# Patient Record
Sex: Female | Born: 1935 | Race: White | Hispanic: No | Smoking: Never smoker
Health system: Southern US, Community
[De-identification: ages and names within clinical notes are randomized; demographics above are authoritative.]

## PROBLEM LIST (undated history)

## (undated) DIAGNOSIS — Z79899 Other long term (current) drug therapy: Secondary | ICD-10-CM

## (undated) DIAGNOSIS — E870 Hyperosmolality and hypernatremia: Secondary | ICD-10-CM

## (undated) DIAGNOSIS — F0150 Vascular dementia without behavioral disturbance: Secondary | ICD-10-CM

## (undated) DIAGNOSIS — I10 Essential (primary) hypertension: Secondary | ICD-10-CM

## (undated) DIAGNOSIS — Y840 Cardiac catheterization as the cause of abnormal reaction of the patient, or of later complication, without mention of misadventure at the time of the procedure: Secondary | ICD-10-CM

## (undated) DIAGNOSIS — E119 Type 2 diabetes mellitus without complications: Secondary | ICD-10-CM

## (undated) DIAGNOSIS — E7800 Pure hypercholesterolemia, unspecified: Secondary | ICD-10-CM

## (undated) DIAGNOSIS — L2089 Other atopic dermatitis: Secondary | ICD-10-CM

## (undated) DIAGNOSIS — R1310 Dysphagia, unspecified: Secondary | ICD-10-CM

## (undated) HISTORY — DX: Other atopic dermatitis: L20.89

## (undated) HISTORY — DX: Hyperosmolality and hypernatremia: E87.0

## (undated) HISTORY — DX: Dysphagia, unspecified: R13.10

## (undated) HISTORY — DX: Other long term (current) drug therapy: Z79.899

## (undated) HISTORY — DX: Vascular dementia without behavioral disturbance: F01.50

## (undated) HISTORY — DX: Cardiac catheterization as the cause of abnormal reaction of the patient, or of later complication, without mention of misadventure at the time of the procedure: Y84.0

## (undated) HISTORY — PX: FRACTURE SURGERY: SHX138

## (undated) HISTORY — DX: Type 2 diabetes mellitus without complications: E11.9

---

## 2002-04-30 LAB — HM PAP SMEAR

## 2004-10-03 ENCOUNTER — Inpatient Hospital Stay (HOSPITAL_COMMUNITY): Admission: EM | Admit: 2004-10-03 | Discharge: 2004-10-05 | Payer: Self-pay | Admitting: Emergency Medicine

## 2004-10-03 ENCOUNTER — Ambulatory Visit: Payer: Self-pay | Admitting: *Deleted

## 2004-10-12 ENCOUNTER — Ambulatory Visit: Payer: Self-pay | Admitting: Cardiology

## 2004-12-13 ENCOUNTER — Ambulatory Visit: Payer: Self-pay

## 2005-01-09 ENCOUNTER — Ambulatory Visit: Payer: Self-pay | Admitting: Cardiology

## 2005-07-19 ENCOUNTER — Ambulatory Visit: Payer: Self-pay | Admitting: Cardiology

## 2006-02-05 ENCOUNTER — Ambulatory Visit: Payer: Self-pay | Admitting: Cardiology

## 2006-08-21 ENCOUNTER — Ambulatory Visit: Payer: Self-pay | Admitting: Cardiology

## 2006-10-05 DIAGNOSIS — Y840 Cardiac catheterization as the cause of abnormal reaction of the patient, or of later complication, without mention of misadventure at the time of the procedure: Secondary | ICD-10-CM

## 2006-10-05 HISTORY — DX: Cardiac catheterization as the cause of abnormal reaction of the patient, or of later complication, without mention of misadventure at the time of the procedure: Y84.0

## 2007-04-07 ENCOUNTER — Ambulatory Visit: Payer: Self-pay | Admitting: Cardiology

## 2007-08-18 ENCOUNTER — Ambulatory Visit: Payer: Self-pay | Admitting: Cardiology

## 2007-08-31 LAB — HM DEXA SCAN

## 2008-08-16 ENCOUNTER — Encounter: Admission: RE | Admit: 2008-08-16 | Discharge: 2008-08-16 | Payer: Self-pay | Admitting: Gastroenterology

## 2010-03-03 ENCOUNTER — Inpatient Hospital Stay (HOSPITAL_COMMUNITY): Admission: EM | Admit: 2010-03-03 | Discharge: 2010-03-13 | Payer: Self-pay | Admitting: Emergency Medicine

## 2010-03-08 ENCOUNTER — Ambulatory Visit: Payer: Self-pay | Admitting: Physical Medicine & Rehabilitation

## 2010-03-10 HISTORY — PX: FEMUR SURGERY: SHX943

## 2010-03-10 HISTORY — PX: SHOULDER SURGERY: SHX246

## 2010-03-13 ENCOUNTER — Inpatient Hospital Stay (HOSPITAL_COMMUNITY)
Admission: RE | Admit: 2010-03-13 | Discharge: 2010-03-21 | Payer: Self-pay | Admitting: Physical Medicine & Rehabilitation

## 2010-08-22 LAB — DIFFERENTIAL
Basophils Absolute: 0 10*3/uL (ref 0.0–0.1)
Lymphocytes Relative: 30 % (ref 12–46)
Lymphs Abs: 2.5 10*3/uL (ref 0.7–4.0)
Neutro Abs: 4.6 10*3/uL (ref 1.7–7.7)
Neutrophils Relative %: 54 % (ref 43–77)

## 2010-08-22 LAB — PROTIME-INR
INR: 1.98 — ABNORMAL HIGH (ref 0.00–1.49)
INR: 2.45 — ABNORMAL HIGH (ref 0.00–1.49)
Prothrombin Time: 18.5 seconds — ABNORMAL HIGH (ref 11.6–15.2)
Prothrombin Time: 19.7 seconds — ABNORMAL HIGH (ref 11.6–15.2)
Prothrombin Time: 22.7 seconds — ABNORMAL HIGH (ref 11.6–15.2)
Prothrombin Time: 23.3 seconds — ABNORMAL HIGH (ref 11.6–15.2)
Prothrombin Time: 26.4 seconds — ABNORMAL HIGH (ref 11.6–15.2)

## 2010-08-22 LAB — CBC
HCT: 35.2 % — ABNORMAL LOW (ref 36.0–46.0)
Hemoglobin: 11.4 g/dL — ABNORMAL LOW (ref 12.0–15.0)
MCH: 30.2 pg (ref 26.0–34.0)
MCH: 30.2 pg (ref 26.0–34.0)
MCH: 30.4 pg (ref 26.0–34.0)
MCHC: 32.4 g/dL (ref 30.0–36.0)
MCHC: 32.7 g/dL (ref 30.0–36.0)
MCHC: 32.7 g/dL (ref 30.0–36.0)
MCV: 93.1 fL (ref 78.0–100.0)
Platelets: 214 10*3/uL (ref 150–400)
Platelets: 266 10*3/uL (ref 150–400)
RBC: 3.55 MIL/uL — ABNORMAL LOW (ref 3.87–5.11)
RBC: 3.64 MIL/uL — ABNORMAL LOW (ref 3.87–5.11)
RDW: 14 % (ref 11.5–15.5)
RDW: 14.1 % (ref 11.5–15.5)

## 2010-08-22 LAB — COMPREHENSIVE METABOLIC PANEL
BUN: 14 mg/dL (ref 6–23)
CO2: 26 mEq/L (ref 19–32)
Calcium: 8.6 mg/dL (ref 8.4–10.5)
Creatinine, Ser: 0.72 mg/dL (ref 0.4–1.2)
GFR calc Af Amer: >60 mL/min (ref >60)
GFR calc non Af Amer: >60 mL/min (ref >60)
Glucose, Bld: 107 mg/dL — ABNORMAL HIGH (ref 70–99)
Total Bilirubin: 1.2 mg/dL (ref 0.3–1.2)

## 2010-08-22 LAB — BASIC METABOLIC PANEL
CO2: 29 mEq/L (ref 19–32)
Calcium: 8.1 mg/dL — ABNORMAL LOW (ref 8.4–10.5)
Creatinine, Ser: 0.65 mg/dL (ref 0.4–1.2)
GFR calc Af Amer: >60 mL/min (ref >60)

## 2010-08-23 LAB — BASIC METABOLIC PANEL
CO2: 24 mEq/L (ref 19–32)
CO2: 25 mEq/L (ref 19–32)
CO2: 28 mEq/L (ref 19–32)
Calcium: 7.5 mg/dL — ABNORMAL LOW (ref 8.4–10.5)
Calcium: 7.8 mg/dL — ABNORMAL LOW (ref 8.4–10.5)
Calcium: 7.9 mg/dL — ABNORMAL LOW (ref 8.4–10.5)
Calcium: 8 mg/dL — ABNORMAL LOW (ref 8.4–10.5)
Calcium: 8.1 mg/dL — ABNORMAL LOW (ref 8.4–10.5)
Calcium: 9.1 mg/dL (ref 8.4–10.5)
Chloride: 104 mEq/L (ref 96–112)
Chloride: 107 mEq/L (ref 96–112)
Chloride: 107 mEq/L (ref 96–112)
Chloride: 110 mEq/L (ref 96–112)
Creatinine, Ser: 0.72 mg/dL (ref 0.4–1.2)
GFR calc Af Amer: >60 mL/min (ref >60)
GFR calc Af Amer: >60 mL/min (ref >60)
GFR calc Af Amer: >60 mL/min (ref >60)
GFR calc Af Amer: >60 mL/min (ref >60)
GFR calc Af Amer: >60 mL/min (ref >60)
GFR calc non Af Amer: >60 mL/min (ref >60)
GFR calc non Af Amer: >60 mL/min (ref >60)
GFR calc non Af Amer: >60 mL/min (ref >60)
GFR calc non Af Amer: >60 mL/min (ref >60)
Glucose, Bld: 118 mg/dL — ABNORMAL HIGH (ref 70–99)
Glucose, Bld: 127 mg/dL — ABNORMAL HIGH (ref 70–99)
Glucose, Bld: 137 mg/dL — ABNORMAL HIGH (ref 70–99)
Glucose, Bld: 170 mg/dL — ABNORMAL HIGH (ref 70–99)
Potassium: 4.1 mEq/L (ref 3.5–5.1)
Potassium: 4.3 mEq/L (ref 3.5–5.1)
Potassium: 4.3 mEq/L (ref 3.5–5.1)
Sodium: 137 mEq/L (ref 135–145)
Sodium: 138 mEq/L (ref 135–145)
Sodium: 138 mEq/L (ref 135–145)
Sodium: 138 mEq/L (ref 135–145)
Sodium: 139 mEq/L (ref 135–145)
Sodium: 140 mEq/L (ref 135–145)
Sodium: 141 mEq/L (ref 135–145)

## 2010-08-23 LAB — PROTIME-INR
INR: 1.11 (ref 0.00–1.49)
INR: 1.21 (ref 0.00–1.49)
INR: 1.4 (ref 0.00–1.49)
Prothrombin Time: 14.5 seconds (ref 11.6–15.2)
Prothrombin Time: 17.1 seconds — ABNORMAL HIGH (ref 11.6–15.2)
Prothrombin Time: 17.4 seconds — ABNORMAL HIGH (ref 11.6–15.2)

## 2010-08-23 LAB — CBC
HCT: 26.2 % — ABNORMAL LOW (ref 36.0–46.0)
HCT: 28.6 % — ABNORMAL LOW (ref 36.0–46.0)
HCT: 35.8 % — ABNORMAL LOW (ref 36.0–46.0)
Hemoglobin: 10 g/dL — ABNORMAL LOW (ref 12.0–15.0)
Hemoglobin: 10.6 g/dL — ABNORMAL LOW (ref 12.0–15.0)
Hemoglobin: 11 g/dL — ABNORMAL LOW (ref 12.0–15.0)
Hemoglobin: 11.9 g/dL — ABNORMAL LOW (ref 12.0–15.0)
Hemoglobin: 8.7 g/dL — ABNORMAL LOW (ref 12.0–15.0)
Hemoglobin: 8.8 g/dL — ABNORMAL LOW (ref 12.0–15.0)
Hemoglobin: 9.4 g/dL — ABNORMAL LOW (ref 12.0–15.0)
MCH: 30 pg (ref 26.0–34.0)
MCH: 30.3 pg (ref 26.0–34.0)
MCH: 30.4 pg (ref 26.0–34.0)
MCH: 30.9 pg (ref 26.0–34.0)
MCHC: 32.9 g/dL (ref 30.0–36.0)
MCHC: 33.2 g/dL (ref 30.0–36.0)
MCHC: 33.2 g/dL (ref 30.0–36.0)
MCHC: 33.3 g/dL (ref 30.0–36.0)
MCV: 91.4 fL (ref 78.0–100.0)
MCV: 92.3 fL (ref 78.0–100.0)
MCV: 92.3 fL (ref 78.0–100.0)
Platelets: 127 10*3/uL — ABNORMAL LOW (ref 150–400)
Platelets: 171 10*3/uL (ref 150–400)
RBC: 2.84 MIL/uL — ABNORMAL LOW (ref 3.87–5.11)
RBC: 2.85 MIL/uL — ABNORMAL LOW (ref 3.87–5.11)
RBC: 3.1 MIL/uL — ABNORMAL LOW (ref 3.87–5.11)
RBC: 3.28 MIL/uL — ABNORMAL LOW (ref 3.87–5.11)
RBC: 3.67 MIL/uL — ABNORMAL LOW (ref 3.87–5.11)
RBC: 3.85 MIL/uL — ABNORMAL LOW (ref 3.87–5.11)
RDW: 14.2 % (ref 11.5–15.5)
WBC: 11.3 10*3/uL — ABNORMAL HIGH (ref 4.0–10.5)
WBC: 12.3 10*3/uL — ABNORMAL HIGH (ref 4.0–10.5)
WBC: 9.2 10*3/uL (ref 4.0–10.5)
WBC: 9.4 10*3/uL (ref 4.0–10.5)

## 2010-08-23 LAB — DIFFERENTIAL
Basophils Relative: 0 % (ref 0–1)
Lymphs Abs: 1.6 10*3/uL (ref 0.7–4.0)
Monocytes Absolute: 1 10*3/uL (ref 0.1–1.0)
Monocytes Relative: 6 % (ref 3–12)
Neutro Abs: 13.4 10*3/uL — ABNORMAL HIGH (ref 1.7–7.7)
Neutrophils Relative %: 83 % — ABNORMAL HIGH (ref 43–77)

## 2010-08-23 LAB — POCT I-STAT 4, (NA,K, GLUC, HGB,HCT)
HCT: 26 % — ABNORMAL LOW (ref 36.0–46.0)
Hemoglobin: 8.8 g/dL — ABNORMAL LOW (ref 12.0–15.0)

## 2010-08-23 LAB — URINALYSIS, ROUTINE W REFLEX MICROSCOPIC
Bilirubin Urine: NEGATIVE
Glucose, UA: NEGATIVE mg/dL
Hgb urine dipstick: NEGATIVE
Specific Gravity, Urine: 1.014 (ref 1.005–1.030)
Urobilinogen, UA: 0.2 mg/dL (ref 0.0–1.0)
pH: 6.5 (ref 5.0–8.0)

## 2010-08-23 LAB — CROSSMATCH: ABO/RH(D): A POS

## 2010-08-23 LAB — ABO/RH: ABO/RH(D): A POS

## 2010-08-23 LAB — HEMOGLOBIN AND HEMATOCRIT, BLOOD
HCT: 34.1 % — ABNORMAL LOW (ref 36.0–46.0)
Hemoglobin: 11.5 g/dL — ABNORMAL LOW (ref 12.0–15.0)

## 2010-10-23 NOTE — Assessment & Plan Note (Signed)
[facility]                            CARDIOLOGY OFFICE NOTE   NAME:Beverly Barnett, Beverly Barnett                         MRN:          [id number]  DATE:08/18/2007                            DOB:          April 16, 1924    Primary care is through [facility].   REASON FOR VISIT:  Hypertension.   HISTORY OF PRESENT ILLNESS:  Beverly Barnett is a pleasant woman that I saw  back in October.  She called the office describing recently-documented  elevated blood pressure at home and also some recent symptoms of double  vision.  She states that she saw her ophthalmologist and was told that  this was more than likely related to some eye muscle fatigue and that it  would go away when she would close one eye.  She had no headaches  otherwise and has been symptomatically stable without chest pain or  breathlessness.  I have spoken with Beverly Barnett in the past about adding  additional therapy for her high blood pressure, which has already been  documented.  Her electrocardiogram today is normal showing sinus rhythm  at 73 beats per minute.  We talked about the risk of long-term elevated  blood pressure as it relates to stroke and heart disease, and she is  agreement with trying an additional medication.   ALLERGIES:  No known drug allergies.   PRESENT MEDICATIONS:  Aspirin 325 mg p.o. daily, multivitamin, CoQ-10,  metoprolol 50 mg p.o. b.i.d., strontium.   REVIEW OF SYSTEMS:  As described in the history of Status present  illness.  Otherwise negative.   EXAMINATION:  Blood pressure is 156/86, rate of 75, weight 139 pounds.  The patient is comfortable, in no acute distress.  HEENT:  Conjunctivae and lids normal.  Pharynx is clear.  NECK:  Supple.  No elevated jugular venous pressure, no loud bruits, no  thyromegaly is noted.  LUNGS:  Clear without labored breathing at rest.  CARDIAC:  A regular rate and rhythm.  No loud murmur or gallop.  ABDOMEN:  Soft, nontender.   Normoactive bowel sounds.  EXTREMITIES:  No significant pitting edema.  Distal pulses are 2+.  SKIN:  Warm and dry.  MUSCULOSKELETAL:  No kyphosis noted.  NEUROPSYCHIATRIC:  The patient alert and oriented x3.  Affect is  appropriate.   IMPRESSION AND RECOMMENDATIONS:  1. Hypertension, not optimally controlled.  I will plan to add Norvasc      5 mg daily to her present regimen.  2. Previously-documented nonobstructive coronary disease with      takotsubo cardiomyopathy.      The patient has had subsequent normalization of left ventricular      function and is doing well symptomatically.     Jonelle Sidle, MD  Electronically Signed    SGM/MedQ  DD: 08/18/2007  DT: 08/19/2007  Job #: [id number]

## 2010-10-23 NOTE — Assessment & Plan Note (Signed)
[facility]                            CARDIOLOGY OFFICE NOTE   NAME:Barnett, Beverly HEARNS                         MRN:          [id number]  DATE:04/07/2007                            DOB:          [birth year]    PRIMARY CARE PHYSICIAN:  [facility].   REASON FOR VISIT:  Cardiac followup.   HISTORY OF PRESENT ILLNESS:  I saw Beverly Barnett back in March.  She reports  doing well with no exertional chest pain or dyspnea.  Her  electrocardiogram today is normal, showing sinus rhythm at 60 beats per  minute.  She remains on the medications outlined below.  I once again  note that her blood pressure is elevated today.  She states that it was  150 at home by her blood pressure monitor before she came here but  that typically it is in the 120-130s.  I talked with her today about  hypertension.  She does have a diagnosis of white coat hypertension,  although I suspect her blood pressure is elevated at baseline.  I spoke  with her today about perhaps advancing her regimen with a trial of  either Norvasc or simply hydrochlorothiazide.  She seemed very  apprehensive about taking any additional medications, and I asked her to  discuss this further with her primary care Beverly Barnett as well.  I have  also asked that she bring her home blood pressure monitor in to her next  visit so that we can verify that it is accurate.   ALLERGIES:  No known drug allergies.   CURRENT MEDICATIONS:  1. Aspirin 325 mg p.o. daily.  2. Multivitamin daily.  3. CoQ 10.  4. Metoprolol 50 mg p.o. b.i.d.  5. Strontium nightly.   REVIEW OF SYSTEMS:  As described in the history of present illness,  otherwise negative.   PHYSICAL EXAMINATION:  Blood pressure checked by me, 182/88.  Heart rate  61.  Weight 140 pounds, which is stable.  Patient is comfortable, well-nourished, in no acute distress.  NECK:  No jugular venous distention or loud bruits.  No thyromegaly is  noted.  LUNGS:   Clear without labored breathing at rest.  CARDIAC:  Regular rate and rhythm without loud murmur or gallop.  ABDOMEN:  Soft and nontender.  EXTREMITIES:  No pitting edema.  Distal pulses are 2+.   IMPRESSION/RECOMMENDATIONS:  History of nonobstructive coronary artery  disease and Takotsubo cardiomyopathy.  Patient has recovered from this  quite well and is asymptomatic on medical therapy.  My main  recommendation at this point is better blood pressure control, and I did  relay my concerns to the patient, although she is very hesitant to add  any additional medications.  I asked her to plan to follow up with her  primary care Beverly Barnett and bring in her  home blood pressure monitor.  Otherwise, I will plan to see her back  over the next six months from a cardiac perspective.     Jonelle Sidle, MD  Electronically Signed    SGM/MedQ  DD: 04/07/2007  DT: 04/07/2007  Job #: [id number]

## 2010-10-26 NOTE — Assessment & Plan Note (Signed)
[facility]                              CARDIOLOGY OFFICE NOTE   NAME:Beverly Barnett, Beverly Barnett                         MRN:          [id number]  DATE:02/05/2006                            DOB:          May 14, 1924    Primary care is by [facility]   REASON FOR VISIT:  Routine followup.   HISTORY OF PRESENT ILLNESS:  I saw Beverly Barnett back in February.  She is doing  well, exercising three times a week without angina or limiting dyspnea and  exertion.  I note that she is now on low-dose Statin therapy, and she tells  me that she has had improvement in both her LDL and HDL with this.  Her  electrocardiogram today shows sinus rhythm at 60 beats per minute, with no  significant changes compared to prior tracing.  Her blood pressure is  elevated today, although tends to be when she comes in for visits.  She  tells me that when she checks it at home, her systolics are usually in the  120-130 range, and I suspect she has most likely some element of white coat  hypertension.  She is not having any heart failure symptoms, orthopnea, PND,  palpitations or syncope.   ALLERGIES:  No known drug allergies.   PRESENT MEDICATIONS:  1. Aspirin 325 mg p.o. q.d.  2. Multivitamin 1 p.o. q.d.  3. __________  [place].  4. Actonel.  5. Lipitor 10 mg daily.  6. Metoprolol 50 mg p.o. b.i.d.   REVIEW OF SYSTEMS:  As described in history of present illness.   EXAMINATION:  VITAL SIGNS:  Blood pressure 176/90, rechecked 160/80, heart  rate 60, weight 137 pounds.  GENERAL:  Patient is comfortable, in no acute distress.  NECK:  Reveals no elevated jugular venous pressure without bruits.  LUNGS:  Clear without labored breathing.  CARDIAC:  Regular rate and rhythm without S3 gallop.  EXTREMITIES:  Show no pitting edema.   IMPRESSION:  1. History of non-obstructive coronary atherosclerosis.  Will continue his      strategy of risk factor modification.  Agree with the  addition of low-      dose Statin therapy and would otherwise continue aspirin and beta      blocker.  Her electrocardiogram is stable.  2. Additional history if possible, Takotsubo cardiomyopathy versus      coronary vasospasm in April of 2006.  She had subsequent normalization      of left ventricular function and is not manifesting any particular      symptoms at this time.  Would continue basic observation.  3. Mild hyperlipidemia.  Agree with low-dose Statin therapy with a goal      LDL cholesterol in the 70-80 range optimally.  She will otherwise      continue regular diet and exercise.                                Jonelle Sidle, MD    SGM/MedQ  DD:  02/05/2006  DT:  02/06/2006  Job #:  [id number]   cc:   [facility]

## 2010-10-26 NOTE — Cardiovascular Report (Signed)
Beverly Barnett, Beverly Barnett                  ACCOUNT NO.:  [id number]   MEDICAL RECORD NO.:  [id number]          PATIENT TYPE:  INP   LOCATION:  2906                         FACILITY:  [facility]   PHYSICIAN:  Carole Binning, M.D. LHCDATE OF BIRTH:  [birth year]   DATE OF PROCEDURE:  10/04/2004  DATE OF DISCHARGE:                              CARDIAC CATHETERIZATION   PROCEDURE:  Left heart catheterization with coronary angiography and left  ventriculography.   INDICATIONS FOR PROCEDURE:  The patient is a 76 year old woman who was  admitted to the hospital yesterday with chest pain. Electrocardiogram showed  deep anterolateral and inferior T wave inversion. The cardiac markers were  elevated, consistent with a non-ST segment elevation myocardial infarction.   PROCEDURE:  A 6 French sheath was placed in the right femoral artery.  Coronary angiography was performed with standard Judkins 6 French catheters.  Left ventriculography was performed with an angled pigtail catheter.  Contrast was Omnipaque. At the conclusion of the procedure a Star-close  vascular occlusion device was placed in the right femoral artery with good  hemostasis. There were no complications.   HEMODYNAMIC RESULTS:  Left ventricular pressure 138/23.  Aortic pressure 144/72.  There is no aortic valve gradient.   LEFT VENTRICULOGRAM:  There is moderate akinesis of the apical wall.  Otherwise wall motion is normal. Ejection fraction is estimated at 55%  (calculated at 63%). There is Koeller mitral regurgitation.   CORONARY ARTERIOGRAPHY (LEFT DOMINANT):  The left main is normal.  Left anterior descending artery has a long area of muscle bridging in the  mid vessel with systolic  compression of the vessel. There is diffuse 20%  stenosis throughout this area with diastole. The left anterior descending is  a long vessel, which curls the apex. The left anterior descending gives rise  to a single normal size diagonal branch.  The  left circumflex has a 20% stenosis in the proximal vessel. It is a large  co-dominant vessels, giving rise to a normal size first obtuse marginal  branch, a large second obtuse marginal branch, small first and second  posterolateral branches, and a normal size third posterolateral branch.  The right coronary artery is a small co-dominant vessel, ending as a small  posterior descending artery. There is a 20% stenosis in the proximal vessel  and  20% in the mid vessel.   IMPRESSION:  1. Left ventricular systolic function characterized by moderate apical      akinesis with a low normal ejection fraction.  2. Systolic compression of the mid left anterior descending artery,      consistent with muscle bridging. However, no obstructive coronary      artery disease is identified.  3. The etiology of the patient's enzyme elevation and wall motion      abnormality is not clear. This may represent a case of Takatsubo      cardiomyopathy versus possible coronary spasm of the mid left anterior      descending at the side of muscle bridging. However, there is no fixed      obstructive coronary artery  disease identified.     PLAN:  Medical therapy.      MWP/MEDQ  D:  10/04/2004  T:  10/04/2004  Job:  [id number]   cc:   Maxwell Caul, M.D.  [address]  [place]  [place] [address]  Fax: [phone]   Jonelle Sidle, M.D. [facility]

## 2010-10-26 NOTE — Assessment & Plan Note (Signed)
[facility]                            CARDIOLOGY OFFICE NOTE   NAME:Spruce, Beverly Barnett                         MRN:          [id number]  DATE:08/21/2006                            DOB:          November 12, 1923    REASON FOR VISIT:  Cardiac follow up.   HISTORY OF PRESENT ILLNESS:  Beverly Barnett returns for a routine visit. She  is doing well with a prior history of non-obstructive coronary  atherosclerosis and takotsubo cardiomyopathy. Her electrocardiogram  today is normal showing sinus rhythm at 70 beats-per-minute. She has  done some traveling recently and is trying to walk on a regular basis,  at least 3 times a week. She is not having any exertional chest pain or  shortness of breath. Home blood pressure checks continue to be within  the normal range per her report. She has had fairly consistent white  coat hypertension.   ALLERGIES:  No known drug allergies.   CURRENT MEDICATIONS:  1. Aspirin 325 mg daily.  2. Multivitamin once daily.  3. CoQ10.  4. Os-Cal.  5. Lipitor 10 mg daily.  6. Metoprolol 50 mg b.i.d.  7. Strontium Support 2 tablets nightly.   REVIEW OF SYSTEMS:  As described in the history of present illness.   PHYSICAL EXAMINATION:  VITAL SIGNS: Blood pressure 182/86, heart rate  70, weight 138 pounds.  GENERAL: The patient is comfortable and in no acute distress.  NECK: Reveals no elevated jugular venous pressure or loud bruits; no  thyromegaly is noted.  LUNGS: Clear without labored breathing.  CARDIAC: Regular rate and rhythm without loud murmur or gallop.  ABDOMEN: Soft and nontender.  EXTREMITIES: Exhibit no significant pitting edema.   IMPRESSION/RECOMMENDATIONS:  History of non-obstructive coronary  atherosclerosis and takotsubo cardiomyopathy. The patient is doing quite  well. She has had subsequently documented normalization of left  ventricular function and has is not having any active symptomatology. I  reviewed her  medications and will not make any specific changes today.  With her white coat hypertension, I wonder if a change from Metoprolol  to a calcium channel blocker might be reasonable, particularly with a  possible element of coronary vasospasm in the past. Perhaps this might  provide better general blood pressure control. She is due to follow up  with her primary care Joal Eakle in the next few weeks. Otherwise, we will  plan to see her back in the next six months.     Jonelle Sidle, MD  Electronically Signed    SGM/MedQ  DD: 08/21/2006  DT: 08/23/2006  Job #: [id number]

## 2010-10-26 NOTE — H&P (Signed)
NAME:  Beverly Barnett, Beverly Barnett NO.:  [id number]   MEDICAL RECORD NO.:  [id number]          PATIENT TYPE:  EMS   LOCATION:  MAJO                         FACILITY:  [facility]   PHYSICIAN:  Jonelle Sidle, M.D. LHCDATE OF BIRTH:  Jan 31, 1924   DATE OF ADMISSION:  10/03/2004  DATE OF DISCHARGE:                                HISTORY & PHYSICAL   PRIMARY CARE PHYSICIAN:  Maxwell Caul, M.D.   CHIEF COMPLAINT:  Recent chest pain and left neck discomfort.   HISTORY OF PRESENT ILLNESS:  Beverly Barnett is a very pleasant 77 year old woman  with no reported major medical history and on no prescription medications  who was in her usual state of health until Monday.  She works at an office  doing clerical work and while at work noticed the sudden onset of a fairly  intense chest discomfort described as a explosion in her chest.  It lasted  for several minutes.  This was associated with diaphoresis and a feeling of  unease.  She lay down for approximately 20 minutes and felt better, although  still not back to baseline.  She went about her day and ultimately went  home, still feeling fatigued.  She went to bed that evening and woke up the  next day feeling better.  In the interim, she had some recurrence of left  lower dull aching chest pain as well as left neck discomfort, although not  nearly as severe.  She arranged an office visit with Dr. Jannetta Quint nurse  practitioner and actually saw them earlier today.  It was noted that she had  a abnormal electrocardiogram showing deep T wave inversions in the inferior  and anterolateral leads associated with ST changes predominantly in the  anterolateral leads.  She was subsequently reported to the emergency  department after being treated with aspirin and we have been asked to  evaluate her further.  At present, she is denying any active symptoms.  Speaking with her in more detail, she denies any typical exertional chest  pain or shortness of  breath.  She has used a stationary bicycle without  symptoms.  She reports no known history of cardiovascular disease.   ALLERGIES:  No known drug allergies.   MEDICATIONS:  At home include:  Multivitamin one p.o. every day, Citrucel  fiber as directed, Co-Enzyme Q12 and Chlorella every day.   PAST MEDICAL HISTORY:  1.  No documented history of hypertension, dyslipidemia, type 2 diabetes      mellitus, or cardiovascular disease.  2.  No reported major surgeries.   SOCIAL HISTORY:  The patient is a widow.  She lives in [place].  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  She states that  she remains active.  She denies any significant tobacco use history.  She  drinks occasional alcohol.  She intermittently uses a stationary bicycle.   FAMILY HISTORY:  Noncontributory at present.   REVIEW OF SYSTEMS:  As described in the history of present illness,  otherwise negative.   PHYSICAL EXAMINATION:  VITAL SIGNS:  Blood pressure 156/86, respiratory rate  16, heart rate 90 and regular, temperature 98.3 degrees.  GENERAL:  This is a well nourished, well developed, elderly woman lying in  bed in no acute distress.  HEENT:  Conjunctivae normal.  Oropharynx is clear.  NECK:  Supple without elevated jugular venous pressure, without carotid  bruits.  No thyromegaly or thyroid tenderness is noted.  LUNGS:  Clear to auscultation with non-labored breathing at rest.  CARDIAC:  Reveals a regular rate and rhythm with an S4 but no loud murmur or  pericardial rub.  ABDOMEN:  Soft with normoactive bowel sounds.  EXTREMITIES:  Show no pitting edema.  Peripheral pulses are 2+.  SKIN:  No ulcer changes are noted.  MUSCULOSKELETAL:  No kyphosis is noted.  NEUROPSYCHIATRIC:  The patient is alert and oriented x 3.   Chest x-ray is reported as showing no acute findings.   LABORATORY DATA:  WBC 7.2, hemoglobin 13.3, hematocrit 38.6, platelets 216.  Sodium 139, potassium 4.1, BUN  20, creatinine 1.0, glucose 118.  Initial  troponin I 0.40.   IMPRESSION:  1.  Recent chest discomfort with subsequent left shoulder discomfort in a 77-      year-old woman with no reported major prior medical history.      Electrocardiogram is abnormal as outlined above and her initial cardiac      markers show a minor troponin I elevation of 0.40.  This is concerning      for an acute coronary syndrome, potentially with onset Monday and      stuttering symptoms since that time.  The patient is presently pain free      and hemodynamically stable.  2.  Uncertain lipid status.  At the present, the patient has elevated blood      pressure but no standing history of chronic hypertension.  She is on no      present medications at home.   PLAN:  1.  The patient will be admitted to the step down unit.  We will plan to      continue telemetry, cycle cardiac markers, treat the patient with      aspirin and Lovenox.  2.  We will add low dose beta-blocker therapy and consider additional      medications as the situation unfolds.  3.  Anticipate diagnostic coronary angiography to clearly outline the      coronary anatomy.  I have discussed the risks and potential benefits      with the patient and she agrees to proceed.  We can then make additional      recommendations at that time.  4.  Followup fasting lipid profile.      SGM/MEDQ  D:  10/03/2004  T:  10/03/2004  Job:  [id number]   cc:   Maxwell Caul, M.D.  [address]  [place]  [place] [address]  Fax: [phone]

## 2010-10-26 NOTE — Discharge Summary (Signed)
NAMEBROOKLYNNE, PEREIDA NO.:  [id number]   MEDICAL RECORD NO.:  [id number]          PATIENT TYPE:  INP   LOCATION:  2906                         FACILITY:  [facility]   PHYSICIAN:  Willa Rough, M.D.     DATE OF BIRTH:  1924-05-31   DATE OF ADMISSION:  10/03/2004  DATE OF DISCHARGE:  10/05/2004                                 DISCHARGE SUMMARY   PRIMARY CARE PHYSICIAN:  Elinor Parkinson, M.D., [facility].   CARDIOLOGIST:  Dr. Diona Browner at the [facility] office.   DISCHARGE DIAGNOSIS:  1.  Status post non-ST elevated myocardial infarction that probably occurred      October 01, 2004 with questionable spasm, questionable __________-type      event. The patient had a definite major stress event the day of chest      pain.  2.  Status post cardiac catheterization October 04, 2004 showing an ejection      fraction low normal, apical akinesis, no obstructive coronary artery      disease. The patient without any diagnosed cardiac history. No medical      problems known.   DISPOSITION:  Being discharged home.   DISCHARGE MEDICATIONS:  1.  Aspirin 325 mg daily.  2.  Lopressor 25 mg p.o. b.i.d.  3.  Pain management, Tylenol for general discomfort, nitroglycerin for chest      discomfort.   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 No lifting  over 10 pounds x1 week.   DISCHARGE DIET:  Low-fat, low-salt.   DISCHARGE INSTRUCTIONS:  Wound care:  Gently clean catheterization site with  soap and water, no tub bathing x2 days. She is to call our office for any  fever, any pain or swelling from the catheterization site.   FOLLOWUP:  I scheduled her for a followup appointment with Dr. Diona Browner for  May 5 at 9:30. Dr. Jerral Bonito in to see patient on day of discharge. Decided  against starting patient on Norvasc at this time. Blood pressure 110/60. The  patient will need followup echocardiogram in 1 month.   HOSPITAL COURSE:  This is a very pleasant  77 year old Caucasian female who  presented to [facility] on the day of admission with complaints of chest  pain. She is fairly active and exercises daily without any limitations. Had  sudden onset of tearing/stretching substernal discomfort associated with  diaphoresis on the day of admission. 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 Evaluated by Dr.  Diona Browner, the patient admitted, and enzymes cycled. Troponin elevated at  0.72. Point of care markers aneurysm enzymes 2.06. The patient to the  catheterization lab on March 27, results as stated above are in  catheterization report. The patient tolerated the procedure without any  complications. No further chest pain. The patient being discharged home with  followup with Dr. Diona Browner. Other pertinent lab work this admission included  a hemoglobin of 13.3, hematocrit 38.6, BUN 20, creatinine 1.0. Lipid panel:  Cholesterol 193, triglycerides 64, HDL 44 with an  LDL of 136. TSH 1.620.      MB/MEDQ  D:  10/05/2004  T:  10/05/2004  Job:  [id number]   cc:   Dr Gordy Levan

## 2011-07-23 ENCOUNTER — Encounter (HOSPITAL_COMMUNITY): Payer: Self-pay

## 2011-07-23 ENCOUNTER — Emergency Department (HOSPITAL_COMMUNITY): Payer: Medicare Other

## 2011-07-23 ENCOUNTER — Inpatient Hospital Stay (HOSPITAL_COMMUNITY)
Admission: EM | Admit: 2011-07-23 | Discharge: 2011-07-29 | DRG: 481 | Disposition: A | Discharge status: Skilled Nursing Facility | Payer: Medicare Other | Attending: Specialist | Admitting: Specialist

## 2011-07-23 DIAGNOSIS — I10 Essential (primary) hypertension: Secondary | ICD-10-CM | POA: Diagnosis present

## 2011-07-23 DIAGNOSIS — E7800 Pure hypercholesterolemia, unspecified: Secondary | ICD-10-CM | POA: Diagnosis present

## 2011-07-23 DIAGNOSIS — S72009A Fracture of unspecified part of neck of unspecified femur, initial encounter for closed fracture: Secondary | ICD-10-CM

## 2011-07-23 DIAGNOSIS — S72143A Displaced intertrochanteric fracture of unspecified femur, initial encounter for closed fracture: Principal | ICD-10-CM | POA: Diagnosis present

## 2011-07-23 DIAGNOSIS — W010XXA Fall on same level from slipping, tripping and stumbling without subsequent striking against object, initial encounter: Secondary | ICD-10-CM | POA: Diagnosis present

## 2011-07-23 DIAGNOSIS — M810 Age-related osteoporosis without current pathological fracture: Secondary | ICD-10-CM | POA: Diagnosis present

## 2011-07-23 DIAGNOSIS — D62 Acute posthemorrhagic anemia: Secondary | ICD-10-CM | POA: Diagnosis not present

## 2011-07-23 HISTORY — DX: Pure hypercholesterolemia, unspecified: E78.00

## 2011-07-23 HISTORY — DX: Essential (primary) hypertension: I10

## 2011-07-23 LAB — DIFFERENTIAL
Basophils Relative: 0 % (ref 0–1)
Eosinophils Absolute: 0 10*3/uL (ref 0.0–0.7)
Eosinophils Relative: 0 % (ref 0–5)
Lymphs Abs: 1.3 10*3/uL (ref 0.7–4.0)
Monocytes Absolute: 0.9 10*3/uL (ref 0.1–1.0)
Monocytes Relative: 6 % (ref 3–12)
Neutrophils Relative %: 86 % — ABNORMAL HIGH (ref 43–77)

## 2011-07-23 LAB — COMPREHENSIVE METABOLIC PANEL
Albumin: 3.8 g/dL (ref 3.5–5.2)
Alkaline Phosphatase: 60 U/L (ref 39–117)
BUN: 20 mg/dL (ref 6–23)
Calcium: 9.4 mg/dL (ref 8.4–10.5)
Creatinine, Ser: 0.76 mg/dL (ref 0.50–1.10)
GFR calc Af Amer: 87 mL/min — ABNORMAL LOW (ref >90)
Glucose, Bld: 145 mg/dL — ABNORMAL HIGH (ref 70–99)
Total Protein: 7.3 g/dL (ref 6.0–8.3)

## 2011-07-23 LAB — CBC
HCT: 36.6 % (ref 36.0–46.0)
Hemoglobin: 12.1 g/dL (ref 12.0–15.0)
MCH: 30.4 pg (ref 26.0–34.0)
MCHC: 33.1 g/dL (ref 30.0–36.0)
MCV: 92 fL (ref 78.0–100.0)
RBC: 3.98 MIL/uL (ref 3.87–5.11)

## 2011-07-23 LAB — URINALYSIS, ROUTINE W REFLEX MICROSCOPIC
Glucose, UA: NEGATIVE mg/dL
Ketones, ur: NEGATIVE mg/dL
Leukocytes, UA: NEGATIVE
Nitrite: NEGATIVE
pH: 6.5 (ref 5.0–8.0)

## 2011-07-23 MED ORDER — MORPHINE SULFATE 4 MG/ML IJ SOLN
4.0000 mg | Freq: Once | INTRAMUSCULAR | Status: AC
  Administered 2011-07-23: 4 mg via INTRAVENOUS
  Filled 2011-07-23: qty 1

## 2011-07-23 MED ORDER — SODIUM CHLORIDE 0.9 % IV SOLN
Freq: Once | INTRAVENOUS | Status: AC
  Administered 2011-07-23: 17:00:00 via INTRAVENOUS

## 2011-07-23 NOTE — Consult Note (Signed)
Reason for Consult tright hip pain  Referring Physician: EDP Beverly Barnett is an 78 y.o. female.  HPI: fell today. C/o pain right hip  Past Medical History  Diagnosis Date  . Hypertension   . High cholesterol   . Osteoporosis     Past Surgical History  Procedure Date  . Femur surgery   . Shoulder surgery     No family history on file.  Social History:  reports that she has never smoked. She does not have any smokeless tobacco history on file. She reports that she does not drink alcohol or use illicit drugs.  Allergies: No Known Allergies  Medications: I have reviewed the patient's current medications.  Results for orders placed during the hospital encounter of 07/23/11 (from the past 48 hour(s))  CBC     Status: Abnormal   Collection Time   07/23/11  4:56 PM      Component Value Range Comment   WBC 16.1 (*) 4.0 - 10.5 (K/uL)    RBC 3.98  3.87 - 5.11 (MIL/uL)    Hemoglobin 12.1  12.0 - 15.0 (g/dL)    HCT 16.1  09.6 - 04.5 (%)    MCV 92.0  78.0 - 100.0 (fL)    MCH 30.4  26.0 - 34.0 (pg)    MCHC 33.1  30.0 - 36.0 (g/dL)    RDW 40.9  81.1 - 91.4 (%)    Platelets 205  150 - 400 (K/uL)   DIFFERENTIAL     Status: Abnormal   Collection Time   07/23/11  4:56 PM      Component Value Range Comment   Neutrophils Relative 86 (*) 43 - 77 (%)    Neutro Abs 13.9 (*) 1.7 - 7.7 (K/uL)    Lymphocytes Relative 8 (*) 12 - 46 (%)    Lymphs Abs 1.3  0.7 - 4.0 (K/uL)    Monocytes Relative 6  3 - 12 (%)    Monocytes Absolute 0.9  0.1 - 1.0 (K/uL)    Eosinophils Relative 0  0 - 5 (%)    Eosinophils Absolute 0.0  0.0 - 0.7 (K/uL)    Basophils Relative 0  0 - 1 (%)    Basophils Absolute 0.0  0.0 - 0.1 (K/uL)   COMPREHENSIVE METABOLIC PANEL     Status: Abnormal   Collection Time   07/23/11  4:56 PM      Component Value Range Comment   Sodium 140  135 - 145 (mEq/L)    Potassium 4.0  3.5 - 5.1 (mEq/L)    Chloride 105  96 - 112 (mEq/L)    CO2 24  19 - 32 (mEq/L)    Glucose, Bld 145 (*) 70 -  99 (mg/dL)    BUN 20  6 - 23 (mg/dL)    Creatinine, Ser 7.82  0.50 - 1.10 (mg/dL)    Calcium 9.4  8.4 - 10.5 (mg/dL)    Total Protein 7.3  6.0 - 8.3 (g/dL)    Albumin 3.8  3.5 - 5.2 (g/dL)    AST 27  0 - 37 (U/L)    ALT 17  0 - 35 (U/L)    Alkaline Phosphatase 60  39 - 117 (U/L)    Total Bilirubin 0.5  0.3 - 1.2 (mg/dL)    GFR calc non Af Amer 75 (*) >90 (mL/min)    GFR calc Af Amer 87 (*) >90 (mL/min)     Dg Femur Right  07/23/2011  *RADIOLOGY REPORT*  Clinical  Data: Fall.  Right leg pain.  RIGHT FEMUR - 2 VIEW  Comparison: 03/09/2010  Findings:  There is an acute, comminuted fracture involving the intertrochanteric region of the proximal right femur.  There is mild varus angulation.  No dislocation.  A long side plate and multiple screws transfixing a healed comminuted distal femur fracture are again noted.  IMPRESSION: Acute fracture involves the intertrochanteric portion of the proximal right femur.  Original Report Authenticated By: Rosealee Albee, M.D.    Review of Systems  Constitutional: Negative.   HENT: Negative.   Eyes: Negative.   Respiratory: Negative.   Cardiovascular: Negative.   Gastrointestinal: Negative.   Genitourinary: Negative.   Musculoskeletal: Positive for joint pain.  Skin: Negative.   Neurological: Negative.   Endo/Heme/Allergies: Negative.   Psychiatric/Behavioral: Negative.   All other systems reviewed and are negative.   Blood pressure 160/59, pulse 83, temperature 98.5 F (36.9 C), temperature source Oral, resp. rate 16, SpO2 99.00%. Physical Exam  Constitutional: She is oriented to person, place, and time. She appears well-developed.  HENT:  Head: Normocephalic.  Eyes: EOM are normal.  Neck: Normal range of motion.  Cardiovascular: Normal rate.   Respiratory: Effort normal.  GI: Soft.  Musculoskeletal: She exhibits tenderness.       Right hip pain with ROM attempt. NVI.  Neurological: She is alert and oriented to person, place, and  time.  Skin: Skin is warm and dry.  Psychiatric: She has a normal mood and affect.    Assessment/Plan: Right intertrochanteric hip fracture. Plan ORIF. Risks Discussed.  Daichi Moris C 07/23/2011, 6:37 PM

## 2011-07-23 NOTE — ED Notes (Signed)
rn asked md about getting a urine sample from pt. md reported not to worry about the urine sample at this time. md at bedside. Pt alert and oriented x4. Respirations even and unlabored, bilateral symmetrical rise and fall of chest. Skin warm and dry. In no acute distress. Denies needs.

## 2011-07-23 NOTE — ED Notes (Signed)
Patient reports that she fell this pm this afternoon while stepping up on brick. Complains of right hip and femur pain, previous fracture to same

## 2011-07-23 NOTE — ED Provider Notes (Cosign Needed)
History     CSN: [id number]  Arrival date & time 07/23/11  1540   First MD Initiated Contact with Patient 07/23/11 1613      Chief Complaint  Patient presents with  . Fall    tripped over a step on porch.   . Leg Pain    Pain to RT thigh.  hx of surgery to same leg  2 yrs ago.  painful to bear weight.    (Consider location/radiation/quality/duration/timing/severity/associated sxs/prior treatment) Patient is a 78 y.o. female presenting with fall and leg pain. The history is provided by the patient (Patient states that she slipped and fell landed on her right hip there has been no loss of consciousness patient has no pain her head only pain in her hip.). No language interpreter was used.  Fall The accident occurred 3 to 5 hours ago. The fall occurred while walking. She fell from a height of 1 to 2 ft. She landed on concrete. There was no blood loss. The point of impact was the right hip. The pain is present in the right hip. The pain is at a severity of 5/10. The pain is moderate. She was not ambulatory at the scene. There was entrapment after the fall. There was no drug use involved in the accident. There was no alcohol use involved in the accident. Pertinent negatives include no abdominal pain, no vomiting, no hematuria and no headaches. The symptoms are aggravated by activity. She has tried nothing for the symptoms. The treatment provided no relief.  Leg Pain     Past Medical History  Diagnosis Date  . Hypertension   . High cholesterol   . Osteoporosis     Past Surgical History  Procedure Date  . Femur surgery   . Shoulder surgery     No family history on file.  History  Substance Use Topics  . Smoking status: Never Smoker   . Smokeless tobacco: Not on file  . Alcohol Use: No    OB History    Grav Para Term Preterm Abortions TAB SAB Ect Mult Living                  Review of Systems  Constitutional: Negative for fatigue.  HENT: Negative for congestion, sinus  pressure and ear discharge.   Eyes: Negative for discharge.  Respiratory: Negative for cough.   Cardiovascular: Negative for chest pain.  Gastrointestinal: Negative for vomiting, abdominal pain and diarrhea.  Genitourinary: Negative for frequency and hematuria.  Musculoskeletal: Negative for back pain.       Hip pain  Skin: Negative for rash.  Neurological: Negative for seizures and headaches.  Hematological: Negative.   Psychiatric/Behavioral: Negative for hallucinations.    Allergies  Review of patient's allergies indicates no known allergies.  Home Medications   Current Outpatient Rx  Name Route Sig Dispense Refill  . ASPIRIN EC 81 MG PO TBEC Oral Take 81 mg by mouth daily.    . CO Q 10 100 MG PO CAPS Oral Take 1 tablet by mouth 2 (two) times daily.    Marland Kitchen LOSARTAN POTASSIUM 50 MG PO TABS Oral Take 50 mg by mouth daily.    Marland Kitchen METOPROLOL TARTRATE 50 MG PO TABS Oral Take 25 mg by mouth 2 (two) times daily.    . ADULT MULTIVITAMIN W/MINERALS CH Oral Take 1-2 tablets by mouth 2 (two) times daily. 2 in am, 1 in pm    . SIMVASTATIN 40 MG PO TABS Oral Take 40 mg by  mouth daily.    . STRONTIUM CHLORIDE CRYS Oral Take 1 tablet by mouth daily.      BP 160/59  Pulse 83  Temp(Src) 98.5 F (36.9 C) (Oral)  Resp 16  SpO2 99%  Physical Exam  Constitutional: She is oriented to person, place, and time. She appears well-developed.  HENT:  Head: Normocephalic and atraumatic.  Eyes: Conjunctivae and EOM are normal. No scleral icterus.  Neck: Neck supple. No thyromegaly present.  Cardiovascular: Normal rate and regular rhythm.  Exam reveals no gallop and no friction rub.   No murmur heard. Pulmonary/Chest: No stridor. She has no wheezes. She has no rales. She exhibits no tenderness.  Abdominal: She exhibits no distension. There is no tenderness. There is no rebound.  Musculoskeletal: She exhibits tenderness. She exhibits no edema.       Tender right hip  Lymphadenopathy:    She has no  cervical adenopathy.  Neurological: She is oriented to person, place, and time. Coordination normal.  Skin: No rash noted. No erythema.  Psychiatric: She has a normal mood and affect. Her behavior is normal.    ED Course  Procedures (including critical care time)  Labs Reviewed  CBC - Abnormal; Notable for the following:    WBC 16.1 (*)    All other components within normal limits  DIFFERENTIAL - Abnormal; Notable for the following:    Neutrophils Relative 86 (*)    Neutro Abs 13.9 (*)    Lymphocytes Relative 8 (*)    All other components within normal limits  COMPREHENSIVE METABOLIC PANEL - Abnormal; Notable for the following:    Glucose, Bld 145 (*)    GFR calc non Af Amer 75 (*)    GFR calc Af Amer 87 (*)    All other components within normal limits  URINALYSIS, ROUTINE W REFLEX MICROSCOPIC  TYPE AND SCREEN  URINE CULTURE  ABO/RH   Dg Femur Right  07/23/2011  *RADIOLOGY REPORT*  Clinical Data: Fall.  Right leg pain.  RIGHT FEMUR - 2 VIEW  Comparison: 03/09/2010  Findings:  There is an acute, comminuted fracture involving the intertrochanteric region of the proximal right femur.  There is mild varus angulation.  No dislocation.  A long side plate and multiple screws transfixing a healed comminuted distal femur fracture are again noted.  IMPRESSION: Acute fracture involves the intertrochanteric portion of the proximal right femur.  Original Report Authenticated By: Rosealee Albee, M.D.   Dg Chest Port 1 View  07/23/2011  *RADIOLOGY REPORT*  Clinical Data: Preop for fracture of the right femur  PORTABLE CHEST - 1 VIEW  Comparison: Chest x-ray of 03/03/2010  Findings: The lungs are clear but hyperaerated.  Mediastinal contours appear normal.  The heart is mildly enlarged and stable. The bones are osteopenic.  Prior fixation of a left humeral neck fracture is noted.  IMPRESSION: Hyperaeration.  No active lung disease.  Stable mild cardiomegaly.  Original Report Authenticated By: Juline Patch, M.D.     1. Hip fracture       MDM          Benny Lennert, MD 07/23/11 2052

## 2011-07-24 ENCOUNTER — Encounter (HOSPITAL_COMMUNITY): Payer: Self-pay | Admitting: *Deleted

## 2011-07-24 ENCOUNTER — Encounter (HOSPITAL_COMMUNITY)
Admission: EM | Disposition: A | Discharge status: Skilled Nursing Facility | Payer: Self-pay | Source: Home / Self Care | Attending: Specialist

## 2011-07-24 ENCOUNTER — Other Ambulatory Visit: Payer: Self-pay

## 2011-07-24 ENCOUNTER — Inpatient Hospital Stay (HOSPITAL_COMMUNITY): Payer: Medicare Other

## 2011-07-24 ENCOUNTER — Encounter (HOSPITAL_COMMUNITY): Payer: Self-pay

## 2011-07-24 ENCOUNTER — Inpatient Hospital Stay (HOSPITAL_COMMUNITY): Payer: Medicare Other | Admitting: *Deleted

## 2011-07-24 HISTORY — PX: FEMUR IM NAIL: SHX1597

## 2011-07-24 LAB — APTT: aPTT: 29 seconds (ref 24–37)

## 2011-07-24 LAB — CBC
HCT: 28.9 % — ABNORMAL LOW (ref 36.0–46.0)
Hemoglobin: 9.5 g/dL — ABNORMAL LOW (ref 12.0–15.0)
MCH: 30.2 pg (ref 26.0–34.0)
RBC: 3.15 MIL/uL — ABNORMAL LOW (ref 3.87–5.11)

## 2011-07-24 LAB — URINE CULTURE: Culture: NO GROWTH

## 2011-07-24 LAB — SURGICAL PCR SCREEN
MRSA, PCR: INVALID — AB
Staphylococcus aureus: INVALID — AB

## 2011-07-24 LAB — PROTIME-INR: INR: 1.06 (ref 0.00–1.49)

## 2011-07-24 SURGERY — INSERTION, INTRAMEDULLARY ROD, FEMUR
Anesthesia: General | Site: Hip | Laterality: Right | Wound class: Clean

## 2011-07-24 MED ORDER — SUCCINYLCHOLINE CHLORIDE 20 MG/ML IJ SOLN
INTRAMUSCULAR | Status: DC | PRN
  Administered 2011-07-24: 100 mg via INTRAVENOUS

## 2011-07-24 MED ORDER — FENTANYL CITRATE 0.05 MG/ML IJ SOLN
25.0000 ug | INTRAMUSCULAR | Status: DC | PRN
  Administered 2011-07-24 (×2): 50 ug via INTRAVENOUS

## 2011-07-24 MED ORDER — PROMETHAZINE HCL 25 MG/ML IJ SOLN: 6.2500 mg | INTRAMUSCULAR | Status: DC | PRN

## 2011-07-24 MED ORDER — METOPROLOL TARTRATE 25 MG PO TABS: 25.0000 mg | ORAL_TABLET | Freq: Two times a day (BID) | ORAL | Status: DC

## 2011-07-24 MED ORDER — METHOCARBAMOL 100 MG/ML IJ SOLN
500.0000 mg | Freq: Four times a day (QID) | INTRAVENOUS | Status: DC | PRN
  Filled 2011-07-24: qty 5

## 2011-07-24 MED ORDER — CEFAZOLIN SODIUM 1-5 GM-% IV SOLN
1.0000 g | Freq: Four times a day (QID) | INTRAVENOUS | Status: AC
  Administered 2011-07-24 – 2011-07-25 (×3): 1 g via INTRAVENOUS
  Filled 2011-07-24 (×3): qty 50

## 2011-07-24 MED ORDER — MORPHINE SULFATE 2 MG/ML IJ SOLN
2.0000 mg | INTRAMUSCULAR | Status: DC | PRN
  Administered 2011-07-24 (×2): 2 mg via INTRAVENOUS
  Filled 2011-07-24 (×5): qty 1

## 2011-07-24 MED ORDER — CEFAZOLIN SODIUM 1-5 GM-% IV SOLN
INTRAVENOUS | Status: AC
  Filled 2011-07-24: qty 50

## 2011-07-24 MED ORDER — POLYETHYLENE GLYCOL 3350 17 G PO PACK
17.0000 g | PACK | Freq: Every day | ORAL | Status: DC | PRN
  Filled 2011-07-24: qty 1

## 2011-07-24 MED ORDER — ONDANSETRON HCL 4 MG/2ML IJ SOLN: 4.0000 mg | Freq: Four times a day (QID) | INTRAMUSCULAR | Status: DC | PRN

## 2011-07-24 MED ORDER — PROPOFOL 10 MG/ML IV EMUL
INTRAVENOUS | Status: DC | PRN
  Administered 2011-07-24: 50 mg via INTRAVENOUS
  Administered 2011-07-24: 125 mg via INTRAVENOUS

## 2011-07-24 MED ORDER — FENTANYL CITRATE 0.05 MG/ML IJ SOLN
INTRAMUSCULAR | Status: DC | PRN
  Administered 2011-07-24 (×3): 50 ug via INTRAVENOUS

## 2011-07-24 MED ORDER — HYDROCODONE-ACETAMINOPHEN 5-325 MG PO TABS
1.0000 | ORAL_TABLET | ORAL | Status: DC | PRN
  Administered 2011-07-24: 2 via ORAL
  Administered 2011-07-25 (×3): 1 via ORAL
  Administered 2011-07-26: 2 via ORAL
  Administered 2011-07-26 – 2011-07-29 (×8): 1 via ORAL
  Administered 2011-07-29: 2 via ORAL
  Administered 2011-07-29: 1 via ORAL
  Filled 2011-07-24 (×3): qty 1
  Filled 2011-07-24: qty 2
  Filled 2011-07-24: qty 1
  Filled 2011-07-24 (×2): qty 2
  Filled 2011-07-24 (×4): qty 1
  Filled 2011-07-24: qty 2
  Filled 2011-07-24 (×3): qty 1

## 2011-07-24 MED ORDER — MENTHOL 3 MG MT LOZG
1.0000 | LOZENGE | OROMUCOSAL | Status: DC | PRN
  Filled 2011-07-24: qty 9

## 2011-07-24 MED ORDER — METOCLOPRAMIDE HCL 10 MG PO TABS: 5.0000 mg | ORAL_TABLET | Freq: Three times a day (TID) | ORAL | Status: DC | PRN

## 2011-07-24 MED ORDER — ONDANSETRON HCL 4 MG/2ML IJ SOLN
INTRAMUSCULAR | Status: DC | PRN
  Administered 2011-07-24: 4 mg via INTRAVENOUS

## 2011-07-24 MED ORDER — ENOXAPARIN SODIUM 40 MG/0.4ML ~~LOC~~ SOLN
40.0000 mg | SUBCUTANEOUS | Status: DC
  Administered 2011-07-25 – 2011-07-29 (×5): 40 mg via SUBCUTANEOUS
  Filled 2011-07-24 (×7): qty 0.4

## 2011-07-24 MED ORDER — SIMVASTATIN 40 MG PO TABS: 40.0000 mg | ORAL_TABLET | Freq: Every day | ORAL | Status: DC

## 2011-07-24 MED ORDER — METOPROLOL TARTRATE 25 MG PO TABS
25.0000 mg | ORAL_TABLET | Freq: Two times a day (BID) | ORAL | Status: DC
  Administered 2011-07-24 – 2011-07-25 (×2): 25 mg via ORAL
  Filled 2011-07-24 (×7): qty 1

## 2011-07-24 MED ORDER — PHENOL 1.4 % MT LIQD: 1.0000 | OROMUCOSAL | Status: DC | PRN

## 2011-07-24 MED ORDER — HYDROCODONE-ACETAMINOPHEN 5-325 MG PO TABS: 1.0000 | ORAL_TABLET | ORAL | Status: DC | PRN

## 2011-07-24 MED ORDER — BISACODYL 10 MG RE SUPP
10.0000 mg | Freq: Every day | RECTAL | Status: DC | PRN
  Administered 2011-07-29: 10 mg via RECTAL
  Filled 2011-07-24: qty 1

## 2011-07-24 MED ORDER — HYDROMORPHONE HCL 1 MG/ML IJ SOLN
0.5000 mg | INTRAMUSCULAR | Status: DC | PRN
  Administered 2011-07-24 – 2011-07-25 (×3): 0.5 mg via INTRAVENOUS
  Filled 2011-07-24 (×4): qty 1

## 2011-07-24 MED ORDER — METOCLOPRAMIDE HCL 5 MG/ML IJ SOLN: 5.0000 mg | Freq: Three times a day (TID) | INTRAMUSCULAR | Status: DC | PRN

## 2011-07-24 MED ORDER — PHENYLEPHRINE HCL 10 MG/ML IJ SOLN
INTRAMUSCULAR | Status: DC | PRN
  Administered 2011-07-24: 120 ug via INTRAVENOUS

## 2011-07-24 MED ORDER — LACTATED RINGERS IV SOLN
INTRAVENOUS | Status: DC
  Administered 2011-07-24: 18:00:00 via INTRAVENOUS
  Administered 2011-07-24: 1000 mL via INTRAVENOUS
  Administered 2011-07-24: 03:00:00 via INTRAVENOUS

## 2011-07-24 MED ORDER — LOSARTAN POTASSIUM 50 MG PO TABS: 50.0000 mg | ORAL_TABLET | Freq: Every day | ORAL | Status: DC

## 2011-07-24 MED ORDER — ADULT MULTIVITAMIN W/MINERALS CH
1.0000 | ORAL_TABLET | Freq: Every day | ORAL | Status: DC
  Administered 2011-07-24 – 2011-07-29 (×6): 1 via ORAL
  Filled 2011-07-24 (×7): qty 1

## 2011-07-24 MED ORDER — METHOCARBAMOL 500 MG PO TABS
500.0000 mg | ORAL_TABLET | Freq: Four times a day (QID) | ORAL | Status: DC | PRN
  Administered 2011-07-26 – 2011-07-29 (×8): 500 mg via ORAL
  Filled 2011-07-24 (×8): qty 1

## 2011-07-24 MED ORDER — ACETAMINOPHEN 325 MG PO TABS: 650.0000 mg | ORAL_TABLET | Freq: Four times a day (QID) | ORAL | Status: DC | PRN

## 2011-07-24 MED ORDER — MORPHINE SULFATE 2 MG/ML IJ SOLN
2.0000 mg | INTRAMUSCULAR | Status: DC | PRN
  Administered 2011-07-24: 2 mg via INTRAVENOUS
  Filled 2011-07-24: qty 1

## 2011-07-24 MED ORDER — EPHEDRINE SULFATE 50 MG/ML IJ SOLN
INTRAMUSCULAR | Status: DC | PRN
  Administered 2011-07-24 (×5): 10 mg via INTRAVENOUS

## 2011-07-24 MED ORDER — KCL IN DEXTROSE-NACL 20-5-0.45 MEQ/L-%-% IV SOLN: INTRAVENOUS | Status: DC

## 2011-07-24 MED ORDER — ACETAMINOPHEN 650 MG RE SUPP: 650.0000 mg | Freq: Four times a day (QID) | RECTAL | Status: DC | PRN

## 2011-07-24 MED ORDER — LOSARTAN POTASSIUM 50 MG PO TABS
50.0000 mg | ORAL_TABLET | Freq: Every day | ORAL | Status: DC
  Administered 2011-07-24 – 2011-07-29 (×6): 50 mg via ORAL
  Filled 2011-07-24 (×7): qty 1

## 2011-07-24 MED ORDER — HYDROMORPHONE HCL 1 MG/ML IJ SOLN: 0.5000 mg | INTRAMUSCULAR | Status: DC | PRN

## 2011-07-24 MED ORDER — DEXTROSE-NACL 5-0.45 % IV SOLN
INTRAVENOUS | Status: DC
  Administered 2011-07-24: 75 mL/h via INTRAVENOUS

## 2011-07-24 MED ORDER — FENTANYL CITRATE 0.05 MG/ML IJ SOLN
INTRAMUSCULAR | Status: AC
  Filled 2011-07-24: qty 2

## 2011-07-24 MED ORDER — SIMVASTATIN 40 MG PO TABS
40.0000 mg | ORAL_TABLET | Freq: Every day | ORAL | Status: DC
  Administered 2011-07-24 – 2011-07-28 (×5): 40 mg via ORAL
  Filled 2011-07-24 (×7): qty 1

## 2011-07-24 MED ORDER — DOCUSATE SODIUM 100 MG PO CAPS
100.0000 mg | ORAL_CAPSULE | Freq: Two times a day (BID) | ORAL | Status: DC
  Administered 2011-07-24 – 2011-07-29 (×10): 100 mg via ORAL
  Filled 2011-07-24 (×11): qty 1

## 2011-07-24 MED ORDER — LIDOCAINE HCL (CARDIAC) 20 MG/ML IV SOLN
INTRAVENOUS | Status: DC | PRN
  Administered 2011-07-24: 50 mg via INTRAVENOUS

## 2011-07-24 MED ORDER — ONDANSETRON HCL 4 MG PO TABS: 4.0000 mg | ORAL_TABLET | Freq: Four times a day (QID) | ORAL | Status: DC | PRN

## 2011-07-24 MED ORDER — PNEUMOCOCCAL VAC POLYVALENT 25 MCG/0.5ML IJ INJ
0.5000 mL | INJECTION | Freq: Once | INTRAMUSCULAR | Status: AC
  Administered 2011-07-24: 0.5 mL via INTRAMUSCULAR
  Filled 2011-07-24 (×2): qty 0.5

## 2011-07-24 MED ORDER — CEFAZOLIN SODIUM 1-5 GM-% IV SOLN
1.0000 g | INTRAVENOUS | Status: AC
  Administered 2011-07-24: 1 g via INTRAVENOUS

## 2011-07-24 MED ORDER — HETASTARCH-ELECTROLYTES 6 % IV SOLN
INTRAVENOUS | Status: DC | PRN
  Administered 2011-07-24: 14:00:00 via INTRAVENOUS

## 2011-07-24 MED ORDER — METHOCARBAMOL 500 MG PO TABS: 500.0000 mg | ORAL_TABLET | Freq: Four times a day (QID) | ORAL | Status: DC | PRN

## 2011-07-24 MED ORDER — LACTATED RINGERS IV SOLN: INTRAVENOUS | Status: DC

## 2011-07-24 SURGICAL SUPPLY — 43 items
BAG ZIPLOCK 12X15 (MISCELLANEOUS) ×2 IMPLANT
BANDAGE GAUZE ELAST BULKY 4 IN (GAUZE/BANDAGES/DRESSINGS) ×2 IMPLANT
BIT DRILL CANN LG 4.3MM (BIT) ×1 IMPLANT
BNDG COHESIVE 6X5 TAN STRL LF (GAUZE/BANDAGES/DRESSINGS) ×2 IMPLANT
CLOTH BEACON ORANGE TIMEOUT ST (SAFETY) ×2 IMPLANT
DRAPE C-ARM 42X72 X-RAY (DRAPES) IMPLANT
DRAPE STERI IOBAN 125X83 (DRAPES) ×2 IMPLANT
DRILL BIT CANN LG 4.3MM (BIT) ×2
DRSG MEPILEX BORDER 4X4 (GAUZE/BANDAGES/DRESSINGS) ×2 IMPLANT
DRSG MEPILEX BORDER 4X8 (GAUZE/BANDAGES/DRESSINGS) ×2 IMPLANT
DRSG PAD ABDOMINAL 8X10 ST (GAUZE/BANDAGES/DRESSINGS) ×2 IMPLANT
DURAPREP 26ML APPLICATOR (WOUND CARE) ×2 IMPLANT
ELECT REM PT RETURN 9FT ADLT (ELECTROSURGICAL) ×2
ELECTRODE REM PT RTRN 9FT ADLT (ELECTROSURGICAL) ×1 IMPLANT
GAUZE SPONGE 4X4 12PLY STRL LF (GAUZE/BANDAGES/DRESSINGS) ×2 IMPLANT
GLOVE BIO SURGEON STRL SZ 6.5 (GLOVE) ×2 IMPLANT
GLOVE BIOGEL PI IND STRL 6.5 (GLOVE) ×1 IMPLANT
GLOVE BIOGEL PI IND STRL 8 (GLOVE) ×1 IMPLANT
GLOVE BIOGEL PI INDICATOR 6.5 (GLOVE) ×1
GLOVE BIOGEL PI INDICATOR 8 (GLOVE) ×1
GLOVE ECLIPSE 6.5 STRL STRAW (GLOVE) IMPLANT
GLOVE SURG SS PI 8.0 STRL IVOR (GLOVE) IMPLANT
GOWN PREVENTION PLUS LG XLONG (DISPOSABLE) ×2 IMPLANT
GOWN PREVENTION PLUS XLARGE (GOWN DISPOSABLE) ×2 IMPLANT
GOWN STRL REIN XL XLG (GOWN DISPOSABLE) ×2 IMPLANT
GUIDEPIN 3.2X17.5 THRD DISP (PIN) ×2 IMPLANT
GUIDEWIRE BALL NOSE 80CM (WIRE) ×2 IMPLANT
HFN LAG SCREW 10.5MM X 85MM (Screw) ×2 IMPLANT
KIT BASIN OR (CUSTOM PROCEDURE TRAY) ×2 IMPLANT
MANIFOLD NEPTUNE II (INSTRUMENTS) ×2 IMPLANT
NAIL HIP FRACT 130D 11X180 (Screw) ×2 IMPLANT
PACK GENERAL/GYN (CUSTOM PROCEDURE TRAY) ×2 IMPLANT
PAD CAST 4YDX4 CTTN HI CHSV (CAST SUPPLIES) ×1 IMPLANT
PADDING CAST COTTON 4X4 STRL (CAST SUPPLIES) ×1
SCREW BONE CORTICAL 5.0X3 (Screw) ×2 IMPLANT
SCREW BONE CORTICAL 5.0X40 (Screw) IMPLANT
SPONGE GAUZE 4X4 12PLY (GAUZE/BANDAGES/DRESSINGS) ×2 IMPLANT
STAPLER VISISTAT (STAPLE) ×4 IMPLANT
SUT VIC AB 0 CT1 27 (SUTURE) ×2
SUT VIC AB 0 CT1 27XBRD ANTBC (SUTURE) ×2 IMPLANT
SUT VIC AB 2-0 CT1 27 (SUTURE) ×1
SUT VIC AB 2-0 CT1 TAPERPNT 27 (SUTURE) ×1 IMPLANT
TRAY FOLEY CATH 14FRSI W/METER (CATHETERS) IMPLANT

## 2011-07-24 NOTE — ED Notes (Signed)
Paged Dr. Haroldine Laws concerning admit.  ? Ortho or triad admit for admission orders.

## 2011-07-24 NOTE — Brief Op Note (Signed)
07/23/2011 - 07/24/2011  2:25 PM  PATIENT:  Bobette Mo Schreur  78 y.o. female  PRE-OPERATIVE DIAGNOSIS:  fractured right hip  POST-OPERATIVE DIAGNOSIS:  fractured right hip  PROCEDURE:  Procedure(s) (LRB): INTRAMEDULLARY (IM) NAIL FEMORAL (Right)  SURGEON:  Surgeon(s) and Role:    * Javier Docker, MD - Primary  PHYSICIAN ASSISTANT:   ASSISTANTS: strader   ANESTHESIA:   general  EBL:  Total I/O In: 1000 [I.V.:1000] Out: 2050 [Urine:1900; Blood:150]  BLOOD ADMINISTERED:none  DRAINS: none   LOCAL MEDICATIONS USED:  NONE  SPECIMEN:  No Specimen  DISPOSITION OF SPECIMEN:  N/A  COUNTS:  YES  TOURNIQUET:  * No tourniquets in log *  DICTATION: .Other Dictation: Dictation Number [id number]  PLAN OF CARE: Admit to inpatient   PATIENT DISPOSITION:  PACU - hemodynamically stable.   Delay start of Pharmacological VTE agent (>24hrs) due to surgical blood loss or risk of bleeding: no

## 2011-07-24 NOTE — H&P (Signed)
Beverly Barnett is an 78 y.o. female.   Chief Complaint: right hip pain HPI: fracture right hip  Past Medical History  Diagnosis Date  . Hypertension   . High cholesterol   . Osteoporosis     Past Surgical History  Procedure Date  . Femur surgery   . Shoulder surgery     No family history on file. Social History:  reports that she has never smoked. She does not have any smokeless tobacco history on file. She reports that she does not drink alcohol or use illicit drugs.  Allergies: No Known Allergies  Medications Prior to Admission  Medication Dose Route Frequency Provider Last Rate Last Dose  . 0.9 %  sodium chloride infusion   Intravenous Once Jethro Bastos, NP      . ceFAZolin (ANCEF) IVPB 1 g/50 mL premix  1 g Intravenous 60 min Pre-Op Liam Graham, PA      . HYDROcodone-acetaminophen (NORCO) 5-325 MG per tablet 1-2 tablet  1-2 tablet Oral Q4H PRN Liam Graham, PA      . HYDROmorphone (DILAUDID) injection 0.5 mg  0.5 mg Intravenous Q2H PRN Liam Graham, PA      . lactated ringers infusion   Intravenous Continuous Liam Graham, PA 100 mL/hr at 07/24/11 0257    . losartan (COZAAR) tablet 50 mg  50 mg Oral Daily Javier Docker, MD   50 mg at 07/24/11 6213  . methocarbamol (ROBAXIN) tablet 500 mg  500 mg Oral Q6H PRN Liam Graham, PA       Or  . methocarbamol (ROBAXIN) 500 mg in dextrose 5 % 50 mL IVPB  500 mg Intravenous Q6H PRN Liam Graham, PA      . metoprolol tartrate (LOPRESSOR) tablet 25 mg  25 mg Oral BID Javier Docker, MD   25 mg at 07/24/11 0865  . morphine 2 MG/ML injection 2 mg  2 mg Intravenous Q2H PRN Javier Docker, MD   2 mg at 07/24/11 0920  . morphine 4 MG/ML injection 4 mg  4 mg Intravenous Once Jethro Bastos, NP   4 mg at 07/23/11 1658  . morphine 4 MG/ML injection 4 mg  4 mg Intravenous Once Benny Lennert, MD   4 mg at 07/23/11 1954  . mulitivitamin with minerals tablet 1 tablet  1 tablet Oral Daily  Javier Docker, MD   1 tablet at 07/24/11 [phone]  . pneumococcal 23 valent vaccine (PNU-IMMUNE) injection 0.5 mL  0.5 mL Intramuscular Once Javier Docker, MD   0.5 mL at 07/24/11 0921  . simvastatin (ZOCOR) tablet 40 mg  40 mg Oral q1800 Javier Docker, MD      . DISCONTD: dextrose 5 % and 0.45 % NaCl with KCl 20 mEq/L infusion   Intravenous Continuous Liam Graham, PA      . DISCONTD: losartan (COZAAR) tablet 50 mg  50 mg Oral Daily Liam Graham, PA      . DISCONTD: metoprolol tartrate (LOPRESSOR) tablet 25 mg  25 mg Oral BID Liam Graham, PA      . DISCONTD: morphine 2 MG/ML injection 2 mg  2 mg Intravenous Q4H PRN Gery Pray, MD   2 mg at 07/24/11 0041  . DISCONTD: simvastatin (ZOCOR) tablet 40 mg  40 mg Oral Daily Liam Graham, PA       No current outpatient prescriptions on file as of 07/24/2011.    Results for  orders placed during the hospital encounter of 07/23/11 (from the past 48 hour(s))  CBC     Status: Abnormal   Collection Time   07/23/11  4:56 PM      Component Value Range Comment   WBC 16.1 (*) 4.0 - 10.5 (K/uL)    RBC 3.98  3.87 - 5.11 (MIL/uL)    Hemoglobin 12.1  12.0 - 15.0 (g/dL)    HCT 16.1  09.6 - 04.5 (%)    MCV 92.0  78.0 - 100.0 (fL)    MCH 30.4  26.0 - 34.0 (pg)    MCHC 33.1  30.0 - 36.0 (g/dL)    RDW 40.9  81.1 - 91.4 (%)    Platelets 205  150 - 400 (K/uL)   DIFFERENTIAL     Status: Abnormal   Collection Time   07/23/11  4:56 PM      Component Value Range Comment   Neutrophils Relative 86 (*) 43 - 77 (%)    Neutro Abs 13.9 (*) 1.7 - 7.7 (K/uL)    Lymphocytes Relative 8 (*) 12 - 46 (%)    Lymphs Abs 1.3  0.7 - 4.0 (K/uL)    Monocytes Relative 6  3 - 12 (%)    Monocytes Absolute 0.9  0.1 - 1.0 (K/uL)    Eosinophils Relative 0  0 - 5 (%)    Eosinophils Absolute 0.0  0.0 - 0.7 (K/uL)    Basophils Relative 0  0 - 1 (%)    Basophils Absolute 0.0  0.0 - 0.1 (K/uL)   COMPREHENSIVE METABOLIC PANEL     Status: Abnormal    Collection Time   07/23/11  4:56 PM      Component Value Range Comment   Sodium 140  135 - 145 (mEq/L)    Potassium 4.0  3.5 - 5.1 (mEq/L)    Chloride 105  96 - 112 (mEq/L)    CO2 24  19 - 32 (mEq/L)    Glucose, Bld 145 (*) 70 - 99 (mg/dL)    BUN 20  6 - 23 (mg/dL)    Creatinine, Ser 7.82  0.50 - 1.10 (mg/dL)    Calcium 9.4  8.4 - 10.5 (mg/dL)    Total Protein 7.3  6.0 - 8.3 (g/dL)    Albumin 3.8  3.5 - 5.2 (g/dL)    AST 27  0 - 37 (U/L)    ALT 17  0 - 35 (U/L)    Alkaline Phosphatase 60  39 - 117 (U/L)    Total Bilirubin 0.5  0.3 - 1.2 (mg/dL)    GFR calc non Af Amer 75 (*) >90 (mL/min)    GFR calc Af Amer 87 (*) >90 (mL/min)   TYPE AND SCREEN     Status: Normal   Collection Time   07/23/11  7:25 PM      Component Value Range Comment   ABO/RH(D) A POS      Antibody Screen NEG      Sample Expiration 07/26/2011     ABO/RH     Status: Normal   Collection Time   07/23/11  7:25 PM      Component Value Range Comment   ABO/RH(D) A POS     URINALYSIS, ROUTINE W REFLEX MICROSCOPIC     Status: Normal   Collection Time   07/23/11  7:41 PM      Component Value Range Comment   Color, Urine YELLOW  YELLOW     APPearance CLEAR  CLEAR  Specific Gravity, Urine 1.017  1.005 - 1.030     pH 6.5  5.0 - 8.0     Glucose, UA NEGATIVE  NEGATIVE (mg/dL)    Hgb urine dipstick NEGATIVE  NEGATIVE     Bilirubin Urine NEGATIVE  NEGATIVE     Ketones, ur NEGATIVE  NEGATIVE (mg/dL)    Protein, ur NEGATIVE  NEGATIVE (mg/dL)    Urobilinogen, UA 0.2  0.0 - 1.0 (mg/dL)    Nitrite NEGATIVE  NEGATIVE     Leukocytes, UA NEGATIVE  NEGATIVE  MICROSCOPIC NOT DONE ON URINES WITH NEGATIVE PROTEIN, BLOOD, LEUKOCYTES, NITRITE, OR GLUCOSE <1000 mg/dL.  CBC     Status: Abnormal   Collection Time   07/24/11  7:45 AM      Component Value Range Comment   WBC 7.6  4.0 - 10.5 (K/uL)    RBC 3.15 (*) 3.87 - 5.11 (MIL/uL)    Hemoglobin 9.5 (*) 12.0 - 15.0 (g/dL)    HCT 40.9 (*) 81.1 - 46.0 (%)    MCV 91.7  78.0 - 100.0  (fL)    MCH 30.2  26.0 - 34.0 (pg)    MCHC 32.9  30.0 - 36.0 (g/dL)    RDW 91.4  78.2 - 95.6 (%)    Platelets 158  150 - 400 (K/uL)    Dg Femur Right  07/23/2011  *RADIOLOGY REPORT*  Clinical Data: Fall.  Right leg pain.  RIGHT FEMUR - 2 VIEW  Comparison: 03/09/2010  Findings:  There is an acute, comminuted fracture involving the intertrochanteric region of the proximal right femur.  There is mild varus angulation.  No dislocation.  A long side plate and multiple screws transfixing a healed comminuted distal femur fracture are again noted.  IMPRESSION: Acute fracture involves the intertrochanteric portion of the proximal right femur.  Original Report Authenticated By: Rosealee Albee, M.D.   Dg Chest Port 1 View  07/23/2011  *RADIOLOGY REPORT*  Clinical Data: Preop for fracture of the right femur  PORTABLE CHEST - 1 VIEW  Comparison: Chest x-ray of 03/03/2010  Findings: The lungs are clear but hyperaerated.  Mediastinal contours appear normal.  The heart is mildly enlarged and stable. The bones are osteopenic.  Prior fixation of a left humeral neck fracture is noted.  IMPRESSION: Hyperaeration.  No active lung disease.  Stable mild cardiomegaly.  Original Report Authenticated By: Juline Patch, M.D.    Review of Systems  Constitutional: Negative.   HENT: Negative.   Eyes: Negative.   Respiratory: Negative.   Cardiovascular: Negative.   Gastrointestinal: Negative.   Genitourinary: Negative.   Musculoskeletal: Positive for joint pain.  Skin: Negative.   Endo/Heme/Allergies: Negative.   Psychiatric/Behavioral: Negative.     Blood pressure 147/69, pulse 97, temperature 98.5 F (36.9 C), temperature source Oral, resp. rate 17, height 5' (1.524 m), weight 66.5 kg (146 lb 9.7 oz), SpO2 94.00%. Physical Exam  Constitutional: She appears well-developed.  HENT:  Head: Normocephalic.  Eyes: Pupils are equal, round, and reactive to light.  Neck: Normal range of motion.  Cardiovascular: Normal  rate.   Respiratory: Effort normal.  GI: Soft.  Musculoskeletal: She exhibits tenderness.       Pain right hip with motion.NVI. -DVT  Skin: Skin is warm.  Psychiatric: She has a normal mood and affect.     Assessment/Plan Fracture right hip. Plan IM nail. Risks discussed. Preop Clearance.  Iver Fehrenbach C 07/24/2011, 10:41 AM

## 2011-07-24 NOTE — Anesthesia Preprocedure Evaluation (Signed)
Anesthesia Evaluation  Patient identified by MRN, date of birth, ID band Patient awake    Reviewed: Allergy & Precautions, H&P , NPO status , Patient's Chart, lab work & pertinent test results, reviewed documented beta blocker date and time   History of Anesthesia Complications Negative for: history of anesthetic complications  Airway Mallampati: II TM Distance: >3 FB Neck ROM: Full    Dental  (+) Partial Upper, Teeth Intact and Dental Advisory Given   Pulmonary neg pulmonary ROS,  clear to auscultation  Pulmonary exam normal       Cardiovascular hypertension, Pt. on medications and Pt. on home beta blockers Regular Normal    Neuro/Psych Negative Neurological ROS  Negative Psych ROS   GI/Hepatic negative GI ROS, Neg liver ROS,   Endo/Other  Negative Endocrine ROS  Renal/GU negative Renal ROS  Genitourinary negative   Musculoskeletal negative musculoskeletal ROS (+)   Abdominal Normal abdominal exam  (+)   Peds  Hematology negative hematology ROS (+)   Anesthesia Other Findings   Reproductive/Obstetrics negative OB ROS                           Anesthesia Physical Anesthesia Plan  ASA: II  Anesthesia Plan: General   Post-op Pain Management:    Induction: Intravenous  Airway Management Planned: Oral ETT  Additional Equipment:   Intra-op Plan:   Post-operative Plan: Extubation in OR  Informed Consent: I have reviewed the patients History and Physical, chart, labs and discussed the procedure including the risks, benefits and alternatives for the proposed anesthesia with the patient or authorized representative who has indicated his/her understanding and acceptance.   Dental advisory given  Plan Discussed with: CRNA  Anesthesia Plan Comments:         Anesthesia Quick Evaluation

## 2011-07-24 NOTE — Progress Notes (Signed)
CSW met with pt today to offer support and assistance with d/c planning needs. Pt plans to have surgery today. CSW will meet again with pt  2/14 once d/c needs are established. Will follow.

## 2011-07-24 NOTE — Clinical Documentation Improvement (Signed)
To clarify earlier notes from RN.  I was paged  At 1148 to [phone].  I returned that call and no one claimed to have paged [place]. I was then called by our answering service a bit later and attempted to call the same number back but again no one claimed to have paged.  I then called our answering service to clarify number and he states that was the number given.  I did not receive another page at 152 am to [phone], in which I called talked to Johnny Bridge and asked if she had contacted Dr Shelle Iron about the hospitalist refusing to admit the patient; she then said no.  I then told the nurse I would enter the orders into the system at that time.

## 2011-07-24 NOTE — Transfer of Care (Signed)
Immediate Anesthesia Transfer of Care Note  Patient: Beverly Barnett  Procedure(s) Performed: Procedure(s) (LRB): INTRAMEDULLARY (IM) NAIL FEMORAL (Right)  Patient Location: PACU  Anesthesia Type: General  Level of Consciousness: awake  Airway & Oxygen Therapy: Patient Spontanous Breathing and Patient connected to face mask oxygen  Post-op Assessment: Report given to PACU RN and Post -op Vital signs reviewed and stable  Post vital signs: Reviewed and stable  Complications: No apparent anesthesia complications

## 2011-07-24 NOTE — Anesthesia Postprocedure Evaluation (Signed)
Anesthesia Post Note  Patient: Beverly Barnett  Procedure(s) Performed: Procedure(s) (LRB): INTRAMEDULLARY (IM) NAIL FEMORAL (Right)  Anesthesia type: General  Patient location: PACU  Post pain: Pain level controlled  Post assessment: Post-op Vital signs reviewed  Last Vitals:  Filed Vitals:   07/24/11 1515  BP: 142/49  Pulse: 81  Temp: 36.6 C  Resp: 13    Post vital signs: Reviewed  Level of consciousness: sedated  Complications: No apparent anesthesia complications

## 2011-07-24 NOTE — ED Notes (Signed)
Dr. Joneen Roach given EKG.  Per Dr. Joneen Roach, pt to admit via ortho MD.  Page Dr. Shelle Iron for admit.  MD paged.

## 2011-07-24 NOTE — ED Notes (Signed)
Paged PA for Dr. Jillyn Hidden concerning admit orders.

## 2011-07-24 NOTE — ED Notes (Signed)
Notified per floor manager that Dr. Joneen Roach not accepting patient.  Told to notify Dr Warnell Forester Estell Harpin concerning admit.  Spoke with Dr. Estell Harpin, he states to call Dr. Jillyn Hidden that he is not getting in the middle.  He stated both physicians were notified and they need to decide who is admitting.

## 2011-07-24 NOTE — Consult Note (Addendum)
PCP:  Unknown  Chief Complaint:  Management of hypertension  HPI: This is a 78 year old female with known history of hypertension and dyslipidemia. Today she tripped and fell and was brought to the ER. Patient sustained a femur fracture, the hospitalist being called with a request for clearance and management of hypertension. Patient reports no cardiac history. No chest pains. No history of congestive heart failure. She is able to hold her ADLs and plan 3 flights of stairs without chest pains.  Review of Systems:  anorexia, fever, weight loss,, vision loss, decreased hearing, hoarseness, chest pain, syncope, dyspnea on exertion, peripheral edema, balance deficits, hemoptysis, abdominal pain, melena, hematochezia, severe indigestion/heartburn, hematuria, incontinence, genital sores, muscle weakness, suspicious skin lesions, transient blindness, difficulty walking, depression, unusual weight change, abnormal bleeding, enlarged lymph nodes, angioedema, and breast masses.  Past Medical History: Past Medical History  Diagnosis Date  . Hypertension   . High cholesterol   . Osteoporosis    Past Surgical History  Procedure Date  . Femur surgery   . Shoulder surgery     Medications: Prior to Admission medications   Medication Sig Start Date End Date Taking? Authorizing Provider  aspirin EC 81 MG tablet Take 81 mg by mouth daily.   Yes Historical Provider, MD  Coenzyme Q10 (CO Q 10) 100 MG CAPS Take 1 tablet by mouth 2 (two) times daily.   Yes Historical Provider, MD  losartan (COZAAR) 50 MG tablet Take 50 mg by mouth daily.   Yes Historical Provider, MD  metoprolol (LOPRESSOR) 50 MG tablet Take 25 mg by mouth 2 (two) times daily.   Yes Historical Provider, MD  Multiple Vitamin (MULITIVITAMIN WITH MINERALS) TABS Take 1-2 tablets by mouth 2 (two) times daily. 2 in am, 1 in pm   Yes Historical Provider, MD  simvastatin (ZOCOR) 40 MG tablet Take 40 mg by mouth daily.   Yes Historical Provider, MD   Strontium Chloride CRYS Take 1 tablet by mouth daily.   Yes Historical Provider, MD    Allergies:  No Known Allergies  Social History:  reports that she has never smoked. She does not have any smokeless tobacco history on file. She reports that she does not drink alcohol or use illicit drugs.  Family History: No family history on file.  Physical Exam: Filed Vitals:   07/23/11 1603 07/23/11 1730 07/23/11 2207  BP: 189/90 160/59 148/58  Pulse: 91 83 87  Temp: 98.5 F (36.9 C)  98 F (36.7 C)  TempSrc: Oral  Oral  Resp: 16 16 18   SpO2: 100% 99% 94%    General:  Alert and oriented times three, well developed and nourished, no acute distress Eyes: PERRLA, pink conjunctiva, no scleral icterus ENT: Moist oral mucosa, neck supple, no thyromegaly Lungs: clear to ascultation, no wheeze, no crackles, no use of accessory muscles Cardiovascular: regular rate and rhythm, no regurgitation, no gallops, 3/6 murmur. No carotid bruits, no JVD Abdomen: soft, positive BS, non-tender, non-distended, no organomegaly, not an acute abdomen GU: not examined Neuro: CN II - XII grossly intact, sensation intact Musculoskeletal: strength 5/5 all extremities strength not assessed in right extremity, no clubbing, cyanosis or edema Skin: no rash, no subcutaneous crepitation, no decubitus Psych: appropriate patient   Labs on Admission:   [facility] 07/23/11 1656  NA 140  K 4.0  CL 105  CO2 24  GLUCOSE 145*  BUN 20  CREATININE 0.76  CALCIUM 9.4  MG --  PHOS --    Basename 07/23/11 1656  AST 27  ALT 17  ALKPHOS 60  BILITOT 0.5  PROT 7.3  ALBUMIN 3.8   No results found for this basename: LIPASE:2,AMYLASE:2 in the last 72 hours  Basename 07/23/11 1656  WBC 16.1*  NEUTROABS 13.9*  HGB 12.1  HCT 36.6  MCV 92.0  PLT 205   No results found for this basename: CKTOTAL:3,CKMB:3,CKMBINDEX:3,TROPONINI:3 in the last 72 hours No components found with this basename: POCBNP:3 No results found  for this basename: DDIMER:2 in the last 72 hours No results found for this basename: HGBA1C:2 in the last 72 hours No results found for this basename: CHOL:2,HDL:2,LDLCALC:2,TRIG:2,CHOLHDL:2,LDLDIRECT:2 in the last 72 hours No results found for this basename: TSH,T4TOTAL,FREET3,T3FREE,THYROIDAB in the last 72 hours No results found for this basename: VITAMINB12:2,FOLATE:2,FERRITIN:2,TIBC:2,IRON:2,RETICCTPCT:2 in the last 72 hours  Micro Results: No results found for this or any previous visit (from the past 240 hour(s)).   Radiological Exams on Admission: Dg Femur Right  07/23/2011  *RADIOLOGY REPORT*  Clinical Data: Fall.  Right leg pain.  RIGHT FEMUR - 2 VIEW  Comparison: 03/09/2010  Findings:  There is an acute, comminuted fracture involving the intertrochanteric region of the proximal right femur.  There is mild varus angulation.  No dislocation.  A long side plate and multiple screws transfixing a healed comminuted distal femur fracture are again noted.  IMPRESSION: Acute fracture involves the intertrochanteric portion of the proximal right femur.  Original Report Authenticated By: Rosealee Albee, M.D.   Dg Chest Port 1 View  07/23/2011  *RADIOLOGY REPORT*  Clinical Data: Preop for fracture of the right femur  PORTABLE CHEST - 1 VIEW  Comparison: Chest x-ray of 03/03/2010  Findings: The lungs are clear but hyperaerated.  Mediastinal contours appear normal.  The heart is mildly enlarged and stable. The bones are osteopenic.  Prior fixation of a left humeral neck fracture is noted.  IMPRESSION: Hyperaeration.  No active lung disease.  Stable mild cardiomegaly.  Original Report Authenticated By: Juline Patch, M.D.   EKG normal sinus rhythm  Assessment/Plan Present on Admission:  Hypertension Hyperlipidemia Stable resume home medications  Low cardiac risk for intervention. Patient is unclear as to weather she's had a murmur previously, will order 2-D echo Femur fracture Per orthopedic  surgery.  Would like to thank ortho for consult   Oksana Deberry 07/24/2011, 1:55 AM

## 2011-07-24 NOTE — ED Notes (Signed)
EKG given to Hospitalist, Joneen Roach, MD.

## 2011-07-24 NOTE — ED Notes (Signed)
Dr. Haroldine Laws in to see patient. EKG ordered.  Hold on notifying Dr. Shelle Iron for admit.  Haroldine Laws will evaluate EKG for medical.  If not appropriate for medical admit, will consult only.

## 2011-07-24 NOTE — ED Notes (Signed)
PA Returned page to dept for Dr. Shelle Iron.  Apparantly earlier pages sent back to wrong number.  PA was unable to reach dept.  PA stated Dr. Shelle Iron would have to give admit orders.

## 2011-07-24 NOTE — ED Notes (Signed)
Dr. Shelle Iron called dept.  He states he saw pt at 9:30pm and was under impression triad to admit.  Had no other contact concerning patient admit until now.  Admit orders given.  Chronological events concerning pt admit explained to md for follow up.

## 2011-07-24 NOTE — ED Notes (Signed)
Dr. Joneen Roach in dept and spoke to patient.  Per Dr. Joneen Roach, pt should be ortho admit.  Verbal given for pain med.  Per Dr. Joneen Roach, decision being made as to whether she is admitting or consulting on patient.  Awaiting admit orders and will follow up.

## 2011-07-24 NOTE — Preoperative (Signed)
Beta Blockers   Reason not to administer Beta Blockers:metoprolol given at 0922 this am

## 2011-07-24 NOTE — ED Notes (Signed)
Notified AC Kelly about admit concerns.  PA for Dr. Jillyn Hidden has not returned call.  Will re-page.  Pt repositioned in bed.  Bottom sheet placed and traction placed back on by [employer], ortho  Tech.  Pt in need of pain meds.  Attempting to reach an MD for orders still met with no results.  Will continue to try.

## 2011-07-25 LAB — BASIC METABOLIC PANEL
Calcium: 7.8 mg/dL — ABNORMAL LOW (ref 8.4–10.5)
Creatinine, Ser: 0.85 mg/dL (ref 0.50–1.10)
GFR calc Af Amer: 70 mL/min — ABNORMAL LOW (ref >90)
GFR calc non Af Amer: 61 mL/min — ABNORMAL LOW (ref >90)
Sodium: 138 mEq/L (ref 135–145)

## 2011-07-25 LAB — CBC
MCH: 31.5 pg (ref 26.0–34.0)
MCHC: 34.1 g/dL (ref 30.0–36.0)
MCV: 92.3 fL (ref 78.0–100.0)
Platelets: 125 10*3/uL — ABNORMAL LOW (ref 150–400)
RDW: 13.5 % (ref 11.5–15.5)

## 2011-07-25 LAB — PROTIME-INR
INR: 1.27 (ref 0.00–1.49)
Prothrombin Time: 16.2 seconds — ABNORMAL HIGH (ref 11.6–15.2)

## 2011-07-25 MED ORDER — METOPROLOL TARTRATE 25 MG PO TABS
25.0000 mg | ORAL_TABLET | Freq: Two times a day (BID) | ORAL | Status: DC
  Administered 2011-07-26 – 2011-07-29 (×6): 25 mg via ORAL
  Filled 2011-07-25 (×9): qty 1

## 2011-07-25 NOTE — Progress Notes (Signed)
FL2 in shadow chart for MD signature. Pt willing to consider ST SNF placement. SNF search initiated and bed offers will be provided in the am. Will follow to asssit with d/c planning.

## 2011-07-25 NOTE — Progress Notes (Signed)
PT Cancellation Note  Treatment cancelled today due to medical issues with patient which prohibited therapy  Pt receiving blood and has decreased BP.  Nurse advises to postpone PT until tomorrow.  Ebony Hail [facility] 07/25/2011, 3:43 PM

## 2011-07-25 NOTE — Progress Notes (Signed)
  Echocardiogram 2D Echocardiogram has been performed.  Beverly Barnett [place] 07/25/2011, 2:22 PM

## 2011-07-25 NOTE — Progress Notes (Signed)
Subjective: 1 Day Post-Op Procedure(s) (LRB): INTRAMEDULLARY (IM) NAIL FEMORAL (Right) Patient reports pain as 5 on 0-10 scale.   Denies CP or SOB.  Positive flatus.   Objective: Vital signs in last 24 hours: Temp:  [97.2 F (36.2 C)-99.7 F (37.6 C)] 99.7 F (37.6 C) (02/14 1035) Pulse Rate:  [81-104] 94  (02/14 1035) Resp:  [12-17] 16  (02/14 1035) BP: (93-151)/(46-78) 93/52 mmHg (02/14 1035) SpO2:  [95 %-100 %] 98 % (02/14 1008)  Intake/Output from previous day: 02/13 0701 - 02/14 0700 In: 3251.3 [P.O.:780; I.V.:1821.3; IV Piggyback:650] Out: 2500 [Urine:2350; Blood:150] Intake/Output this shift:     Basename 07/25/11 0325 07/24/11 0745 07/23/11 1656  HGB 7.0* 9.5* 12.1    Basename 07/25/11 0325 07/24/11 0745  WBC 8.2 7.6  RBC 2.22* 3.15*  HCT 20.5* 28.9*  PLT 125* 158    Basename 07/25/11 0325 07/23/11 1656  NA 138 140  K 4.0 4.0  CL 104 105  CO2 26 24  BUN 16 20  CREATININE 0.85 0.76  GLUCOSE 149* 145*  CALCIUM 7.8* 9.4    Basename 07/25/11 0325 07/24/11 1245  LABPT -- --  INR 1.27 1.06    Neurologically intact Neurovascular intact Sensation intact distally Incision: no drainage Compartment soft no evidence of hematoma  Assessment/Plan: 1 Day Post-Op Procedure(s) (LRB): INTRAMEDULLARY (IM) NAIL FEMORAL (Right) Symptomatic post op anemia  Will transfuse 2 units PRBC and repeat HH in am Strict I and Os Advance diet Continue foley due to blood transfusion; will continue until blood transfusion complete SNF likely when stable Beverly Barnett R. 07/25/2011, 11:01 AM

## 2011-07-25 NOTE — Op Note (Signed)
Beverly Barnett, ROLIN NO.:  [id number]  MEDICAL RECORD NO.:  [id number]  LOCATION:  1306                         FACILITY:  [facility]  PHYSICIAN:  Jene Every, M.D.    DATE OF BIRTH:  [birth year]  DATE OF PROCEDURE: DATE OF DISCHARGE:                              OPERATIVE REPORT   PREOPERATIVE DIAGNOSIS:  Right intertrochanteric hip fracture.  POSTOPERATIVE DIAGNOSIS:  Right intertrochanteric hip fracture.  PROCEDURE PERFORMED:  Intramedullary nailing, right hip with closed reduction.  ANESTHESIA:  General.  ASSISTANT:  Roma Schanz, P.A.  HISTORY:  An 78 year old with intertrochanteric hip fracture above a distal femoral fracture treated previously.  She was indicated for intramedullary nailing of the fracture.  Risks and benefits discussed including bleeding, infection, malunion, nonunion, DVT, PE, anesthetic complications etc.  TECHNIQUE:  The patient in supine position, after induction of adequate general anesthesia and 1g Kefzol, placed on the fracture table, right lower extremity in gentle longitudinal traction and internal rotation, well-leg in gentle flexion, external rotation.  Peroneal post well- padded, Foley to gravity.  The right peritrochanteric region was prepped and draped in usual sterile fashion after reduction was obtained under C- arm x-ray.  Incision proximal to the greater trochanter.  Subcutaneous tissue was dissected by cautery to achieve hemostasis.  The fascia lata identified via lateral skin incision.  Tip of the trochanter was identified with a K-wire, was advanced manually into the intramedullary canal  under x-ray.  It was then overreamed to the lesser trochanter. Lesser trochanter was fractured and displaced.  She had a fairly small intramedullary canal.  We selected a 11 130-degree intramedullary short rod.  After hand advancing, there was some tightness within the canal. I then retreated it, reamed to a 12.5-mm,  accepted a 11 without difficulty.  She preoperatively had an anterior medial cortex and lateral.  We inserted the device without difficulty.  With the external alignment guide, we advanced the guidewire up to the femoral head and neck.  It was measured and reamed to 90.  We inserted an 85 and countersunk it.  Following this, then we released the traction and compressed the fracture site, which made the end of the lag screw flushed with the lateral aspect of the femur.  This was seen in the AP and lateral plane as satisfactory.  We reduced the fracture and compressed it.  We placed a locking screw in the dynamic slot as the static slot was not accepted screw.  This was utilized in external alignment guide incision through the skin and blunt dissection down the lateral aspect of the femur, reamed and drilled transversely, initially measuring at a 40 and then at the 34, advanced the screw with excellent purchase in the AP and lateral plane.  It was found to be satisfactory. This came approximately within 7 cm of the proximal aspect of the distal femoral plate, which was used for ORIF of her femur previously.  The fracture was identified here.  Therefore, we copiously irrigated all wounds.  I removed the external alignment guide in the AP and lateral plane.  There was excellent reduction of the fracture and placement of the hardware.  I  then achieved hemostasis with electrocautery, closed the fascia lata with #1 Vicryl and interrupted figure-of-eight sutures, subcu with 2-0 Vicryl simple sutures.  Skin was reapproximated with staples.  Wound was dressed sterilely, released from the traction table. Leg-lengths were examined equivalent, pulses intact, appropriate rotation.  Patient was then extubated without difficulty and transported to the recovery room in satisfactory condition.  Patient tolerated the procedure well.  No complications.  Assistant was Roma Schanz who helped with  prepping.  Blood loss was 150 cc.     Jene Every, M.D.     [name]  D:  07/24/2011  T:  07/25/2011  Job:  [id number]

## 2011-07-25 NOTE — Progress Notes (Signed)
Pt seen and examined, afebrile, vitals stable,  Doing well post op Will decrease IVF  Post op blood loss anemia, check CBC in am, may need transfusion  Zannie Cove, MD

## 2011-07-25 NOTE — Progress Notes (Signed)
Subjective: Feels weak, doing ok otherwise  Objective: Vital signs in last 24 hours: Temp:  [97.4 F (36.3 C)-99.7 F (37.6 C)] 99.4 F (37.4 C) (02/14 1500) Pulse Rate:  [83-104] 87  (02/14 1500) Resp:  [16-18] 18  (02/14 1500) BP: (93-150)/(50-78) 107/60 mmHg (02/14 1500) SpO2:  [95 %-100 %] 98 % (02/14 1008) Weight change:     Intake/Output from previous day: 02/13 0701 - 02/14 0700 In: 3251.3 [P.O.:780; I.V.:1821.3; IV Piggyback:650] Out: 2500 [Urine:2350; Blood:150] Total I/O In: 1240 [P.O.:840; I.V.:50; Blood:350] Out: 800 [Urine:800]   Physical Exam: General: Alert, awake, oriented x3, in no acute distress. HEENT: No bruits, no goiter. Heart: Regular rate and rhythm, without murmurs, rubs, gallops. Lungs: Clear to auscultation bilaterally. Abdomen: Soft, nontender, nondistended, positive bowel sounds. Extremities:dressing on lateral leg, no edema with positive pedal pulses. Neuro: Grossly intact, nonfocal.   Lab Results: Basic Metabolic Panel:  Basename 07/25/11 0325 07/23/11 1656  NA 138 140  K 4.0 4.0  CL 104 105  CO2 26 24  GLUCOSE 149* 145*  BUN 16 20  CREATININE 0.85 0.76  CALCIUM 7.8* 9.4  MG -- --  PHOS -- --   Liver Function Tests:  Basename 07/23/11 1656  AST 27  ALT 17  ALKPHOS 60  BILITOT 0.5  PROT 7.3  ALBUMIN 3.8   No results found for this basename: LIPASE:2,AMYLASE:2 in the last 72 hours No results found for this basename: AMMONIA:2 in the last 72 hours CBC:  Basename 07/25/11 0325 07/24/11 0745 07/23/11 1656  WBC 8.2 7.6 --  NEUTROABS -- -- 13.9*  HGB 7.0* 9.5* --  HCT 20.5* 28.9* --  MCV 92.3 91.7 --  PLT 125* 158 --   Cardiac Enzymes: No results found for this basename: CKTOTAL:3,CKMB:3,CKMBINDEX:3,TROPONINI:3 in the last 72 hours BNP: No results found for this basename: PROBNP:3 in the last 72 hours D-Dimer: No results found for this basename: DDIMER:2 in the last 72 hours CBG: No results found for this basename:  GLUCAP:6 in the last 72 hours Hemoglobin A1C: No results found for this basename: HGBA1C in the last 72 hours Fasting Lipid Panel: No results found for this basename: CHOL,HDL,LDLCALC,TRIG,CHOLHDL,LDLDIRECT in the last 72 hours Thyroid Function Tests: No results found for this basename: TSH,T4TOTAL,FREET4,T3FREE,THYROIDAB in the last 72 hours Anemia Panel: No results found for this basename: VITAMINB12,FOLATE,FERRITIN,TIBC,IRON,RETICCTPCT in the last 72 hours Coagulation:  Basename 07/25/11 0325 07/24/11 1245  LABPROT 16.2* 14.0  INR 1.27 1.06   Urine Drug Screen: Drugs of Abuse  No results found for this basename: labopia, cocainscrnur, labbenz, amphetmu, thcu, labbarb    Alcohol Level: No results found for this basename: ETH:2 in the last 72 hours Urinalysis:  Basename 07/23/11 1941  COLORURINE YELLOW  LABSPEC 1.017  PHURINE 6.5  GLUCOSEU NEGATIVE  HGBUR NEGATIVE  BILIRUBINUR NEGATIVE  KETONESUR NEGATIVE  PROTEINUR NEGATIVE  UROBILINOGEN 0.2  NITRITE NEGATIVE  LEUKOCYTESUR NEGATIVE    Recent Results (from the past 240 hour(s))  URINE CULTURE     Status: Normal   Collection Time   07/23/11  7:41 PM      Component Value Range Status Comment   Specimen Description URINE, CATHETERIZED   Final    Special Requests NONE   Final    Culture  Setup Time [id number]   Final    Colony Count NO GROWTH   Final    Culture NO GROWTH   Final    Report Status 07/24/2011 FINAL   Final   SURGICAL PCR SCREEN  Status: Abnormal   Collection Time   07/24/11  9:58 AM      Component Value Range Status Comment   MRSA, PCR INVALID RESULTS, SPECIMEN SENT FOR CULTURE (*) NEGATIVE  Final    Staphylococcus aureus INVALID RESULTS, SPECIMEN SENT FOR CULTURE (*) NEGATIVE  Final   MRSA CULTURE     Status: Normal (Preliminary result)   Collection Time   07/24/11  9:58 AM      Component Value Range Status Comment   Specimen Description NOSE   Final    Special Requests NONE   Final    Culture  NO GROWTH   Final    Report Status PENDING   Incomplete     Studies/Results: Dg Hip Operative Right  07/24/2011  *RADIOLOGY REPORT*  Clinical Data: Hip fracture  OPERATIVE RIGHT HIP  Comparison: 07/23/2011  Findings: The patient is status post open reduction internal fixation of right intertrochanteric fracture by using a dynamic hip screw and short intramedullary nail with a single interlocking distal screw.  There is partial visualization of a plate along the lateral cortex of the distal femur.  IMPRESSION: Satisfactory appearance status post ORIF right intertrochanteric fracture.  Original Report Authenticated By: Elsie Stain, M.D.   Dg Femur Right  07/23/2011  *RADIOLOGY REPORT*  Clinical Data: Fall.  Right leg pain.  RIGHT FEMUR - 2 VIEW  Comparison: 03/09/2010  Findings:  There is an acute, comminuted fracture involving the intertrochanteric region of the proximal right femur.  There is mild varus angulation.  No dislocation.  A long side plate and multiple screws transfixing a healed comminuted distal femur fracture are again noted.  IMPRESSION: Acute fracture involves the intertrochanteric portion of the proximal right femur.  Original Report Authenticated By: Rosealee Albee, M.D.   Dg Chest Port 1 View  07/23/2011  *RADIOLOGY REPORT*  Clinical Data: Preop for fracture of the right femur  PORTABLE CHEST - 1 VIEW  Comparison: Chest x-ray of 03/03/2010  Findings: The lungs are clear but hyperaerated.  Mediastinal contours appear normal.  The heart is mildly enlarged and stable. The bones are osteopenic.  Prior fixation of a left humeral neck fracture is noted.  IMPRESSION: Hyperaeration.  No active lung disease.  Stable mild cardiomegaly.  Original Report Authenticated By: Juline Patch, M.D.   Dg Hip Portable 1 View Right  07/24/2011  *RADIOLOGY REPORT*  Clinical Data: Fracture fixation.  PORTABLE RIGHT HIP - 1 VIEW  Comparison: Plain films of the right femur 07/23/2011.  Findings: The  patient has a new dynamic hip screw and short IM nail with a single distal interlocking screw for fixation of a right intertrochanteric fracture.  Lesser trochanter is a separate fragment.  The right hip is located and no new abnormality is identified.  Partial visualization of a plate along the lateral cortex of the mid diaphysis of the femur noted.  IMPRESSION: ORIF right intertrochanteric fracture.  Original Report Authenticated By: Bernadene Bell. Maricela Curet, M.D.    Medications: Scheduled Meds:   .  ceFAZolin (ANCEF) IV  1 g Intravenous Q6H  . docusate sodium  100 mg Oral BID  . enoxaparin  40 mg Subcutaneous Q24H  . fentaNYL      . losartan  50 mg Oral Daily  . metoprolol tartrate  25 mg Oral BID  . mulitivitamin with minerals  1 tablet Oral Daily  . simvastatin  40 mg Oral q1800   Continuous Infusions:   . DISCONTD: dextrose 5 % and 0.45%  NaCl 75 mL/hr (07/24/11 1510)  . DISCONTD: lactated ringers 50 mL/hr at 07/24/11 1821  . DISCONTD: lactated ringers     PRN Meds:.acetaminophen, acetaminophen, bisacodyl, HYDROcodone-acetaminophen, HYDROmorphone, menthol-cetylpyridinium, methocarbamol(ROBAXIN) IV, methocarbamol, metoCLOPramide (REGLAN) injection, metoCLOPramide, morphine injection, ondansetron (ZOFRAN) IV, ondansetron, phenol, polyethylene glycol, DISCONTD: fentaNYL, DISCONTD: HYDROcodone-acetaminophen, DISCONTD: HYDROmorphone, DISCONTD: methocarbamol(ROBAXIN) IV, DISCONTD: methocarbamol DISCONTD: promethazine  Assessment/Plan: 1. Hip fracture s/p IM nail, POD1 2. Post op anemia: for 2units PRBC today 3. HTN: anti-hypertensives on hold 4. DVT prophylaxis on lovenox     LOS: 2 days   [facility] Pager: [phone] 07/25/2011, 3:25 PM

## 2011-07-25 NOTE — Progress Notes (Signed)
Report called to Amy, RN. Pt to be transferred to 1619.

## 2011-07-26 LAB — TYPE AND SCREEN
ABO/RH(D): A POS
Antibody Screen: NEGATIVE
Unit division: 0
Unit division: 0

## 2011-07-26 LAB — BASIC METABOLIC PANEL
CO2: 29 mEq/L (ref 19–32)
Calcium: 8 mg/dL — ABNORMAL LOW (ref 8.4–10.5)
Creatinine, Ser: 0.7 mg/dL (ref 0.50–1.10)
GFR calc non Af Amer: 77 mL/min — ABNORMAL LOW (ref >90)

## 2011-07-26 LAB — CBC
MCH: 30.2 pg (ref 26.0–34.0)
MCHC: 33.7 g/dL (ref 30.0–36.0)
MCV: 89.5 fL (ref 78.0–100.0)
Platelets: 121 10*3/uL — ABNORMAL LOW (ref 150–400)
RBC: 2.95 MIL/uL — ABNORMAL LOW (ref 3.87–5.11)
RDW: 14.5 % (ref 11.5–15.5)

## 2011-07-26 LAB — PROTIME-INR: Prothrombin Time: 15.2 seconds (ref 11.6–15.2)

## 2011-07-26 NOTE — Progress Notes (Signed)
Subjective: 2 Days Post-Op Procedure(s) (LRB): INTRAMEDULLARY (IM) NAIL FEMORAL (Right) Patient reports pain as 6 on 0-10 scale.   Denies CP or SOB. Positive flatus.  Denies dizziness or nausea.  She has not been out of bed yet with PT Objective: Vital signs in last 24 hours: Temp:  [97.8 F (36.6 C)-99.7 F (37.6 C)] 98.1 F (36.7 C) (02/15 0617) Pulse Rate:  [80-103] 80  (02/15 0617) Resp:  [16-18] 18  (02/15 0617) BP: (93-150)/(52-74) 126/70 mmHg (02/15 0617) SpO2:  [93 %-99 %] 95 % (02/15 0617)  Intake/Output from previous day: 02/14 0701 - 02/15 0700 In: 1640 [P.O.:840; I.V.:100; Blood:700] Out: 1950 [Urine:1950] Intake/Output this shift: Total I/O In: 240 [P.O.:240] Out: -    Basename 07/26/11 0345 07/25/11 0325 07/24/11 0745 07/23/11 1656  HGB 9.0* 7.0* 9.5* 12.1    Basename 07/26/11 0345 07/25/11 0325  WBC 7.7 8.2  RBC 2.95* 2.22*  HCT 26.4* 20.5*  PLT 121* 125*    Basename 07/26/11 0345 07/25/11 0325  NA 140 138  K 4.0 4.0  CL 107 104  CO2 29 26  BUN 13 16  CREATININE 0.70 0.85  GLUCOSE 112* 149*  CALCIUM 8.0* 7.8*    Basename 07/26/11 0345 07/25/11 0325  LABPT -- --  INR 1.18 1.27    Neurologically intact Neurovascular intact Sensation intact distally Incision: dressing C/D/I Compartment soft  Assessment/Plan: 2 Days Post-Op Procedure(s) (LRB): INTRAMEDULLARY (IM) NAIL FEMORAL (Right) Advance diet Up with therapy Patient will likely need SNF however she is wanting to go home but unsure of care at home Lovenox for DVT prophylaxis Daily dressing change Cont to monitor H/H   Taevin Mcferran R. 07/26/2011, 8:19 AM

## 2011-07-26 NOTE — Evaluation (Signed)
Physical Therapy Evaluation Patient Details Name: Beverly Barnett MRN: [id number] DOB: 11-07-1923 Today's Date: 07/26/2011  Problem List: There is no problem list on file for this patient.   Past Medical History:  Past Medical History  Diagnosis Date  . Hypertension   . High cholesterol   . Osteoporosis    Past Surgical History:  Past Surgical History  Procedure Date  . Femur surgery   . Shoulder surgery     PT Assessment/Plan/Recommendation PT Assessment Clinical Impression Statement: Pt with R femur fx and NWB status presents with decreased R LE strength/ROM and limited functional mobility.  Pt will benefit from skilled PT intervention to maximize IND for d/c to next venue of care and eventual d/c home. PT Recommendation/Assessment: Patient will need skilled PT in the acute care venue PT Problem List: Decreased strength;Decreased range of motion;Decreased activity tolerance;Decreased mobility;Decreased balance;Decreased knowledge of use of DME;Decreased knowledge of precautions;Pain PT Therapy Diagnosis : Difficulty walking PT Plan PT Frequency: Min 5X/week PT Treatment/Interventions: DME instruction;Gait training;Stair training;Functional mobility training;Therapeutic activities;Therapeutic exercise;Patient/family education PT Recommendation Recommendations for Other Services: OT consult Follow Up Recommendations: Skilled nursing facility Equipment Recommended: Defer to next venue PT Goals  Acute Rehab PT Goals PT Goal Formulation: With patient Time For Goal Achievement: 7 days Pt will go Supine/Side to Sit: with min assist PT Goal: Supine/Side to Sit - Progress: Goal set today Pt will go Sit to Supine/Side: with min assist PT Goal: Sit to Supine/Side - Progress: Goal set today Pt will go Sit to Stand: with min assist PT Goal: Sit to Stand - Progress: Goal set today Pt will go Stand to Sit: with min assist PT Goal: Stand to Sit - Progress: Goal set today Pt will Ambulate:  16 - 50 feet;with min assist;with rolling walker PT Goal: Ambulate - Progress: Goal set today  PT Evaluation Precautions/Restrictions  Precautions Precautions: Fall Restrictions Weight Bearing Restrictions: Yes RLE Weight Bearing: Partial weight bearing Prior Functioning  Home Living Lives With: Alone Receives Help From: Family Type of Home: House Bathroom Shower/Tub: Nurse, adult Toilet: Standard Home Adaptive Equipment: Tub transfer bench Prior Function Level of Independence: Independent with basic ADLs;Independent with gait;Independent with transfers Able to Take Stairs?: Yes Cognition Cognition Arousal/Alertness: Awake/alert Overall Cognitive Status: Appears within functional limits for tasks assessed Orientation Level: Oriented X4 Sensation/Coordination Coordination Gross Motor Movements are Fluid and Coordinated: Yes Extremity Assessment RUE Assessment RUE Assessment: Within Functional Limits LUE Assessment LUE Assessment: Within Functional Limits RLE Assessment RLE Assessment: Exceptions to [facility] (hip flex 60, abd 20, strength 2/5 hip and knee) LLE Assessment LLE Assessment: Within Functional Limits Mobility (including Balance) Bed Mobility Bed Mobility: Yes Supine to Sit: 1: +2 Total assist Supine to Sit Details (indicate cue type and reason): cues for sequence and self-assist with L LE Transfers Transfers: Yes Sit to Stand: 3: Mod assist;With upper extremity assist;From chair/3-in-1 Sit to Stand Details (indicate cue type and reason): min verbal cues Stand to Sit: 1: +2 Total assist;With upper extremity assist;To chair/3-in-1;With armrests Stand to Sit Details: cues for LE position and use of UEs - pt 60% Ambulation/Gait Ambulation/Gait: Yes Ambulation/Gait Assistance: 1: +2 Total assist Ambulation/Gait Assistance Details (indicate cue type and reason): cues for posture, increased UE WB, position from RW, sequence, Ambulation Distance (Feet): 6  Feet Assistive device: Rolling walker Gait Pattern: Step-to pattern Gait velocity: slow    Exercise  General Exercises - Lower Extremity Ankle Circles/Pumps: AROM;Both;15 reps;Supine Quad Sets: AROM;10 reps;Both;Supine Heel Slides: AAROM;10 reps;Supine;Right Hip  ABduction/ADduction: AAROM;Right;10 reps;Supine End of Session PT - End of Session Equipment Utilized During Treatment: Gait belt Activity Tolerance: Patient tolerated treatment well;Patient limited by fatigue Patient left: in chair;with call bell in reach;with family/visitor present Nurse Communication: Mobility status for transfers;Mobility status for ambulation General Behavior During Session: [facility] for tasks performed Cognition: [facility] for tasks performed  Kurt Hoffmeier 07/26/2011, 11:43 AM

## 2011-07-26 NOTE — Evaluation (Addendum)
Occupational Therapy Evaluation Patient Details Name: Beverly Barnett MRN: [id number] DOB: 06/05/1924 Today's Date: 07/26/2011  Problem List: There is no problem list on file for this patient.   Past Medical History:  Past Medical History  Diagnosis Date  . Hypertension   . High cholesterol   . Osteoporosis    Past Surgical History:  Past Surgical History  Procedure Date  . Femur surgery   . Shoulder surgery     OT Assessment/Plan/Recommendation OT Assessment Clinical Impression Statement: This 78 year old female was admitted with R hip fx s/p IM nail and is PWB. Pt is appropriate for skilled OT to work on increased activity tolerance focusing on standing adls and toilet transfers. OT Recommendation/Assessment: Patient will need skilled OT in the acute care venue OT Problem List: Decreased strength;Decreased activity tolerance;Decreased knowledge of use of DME or AE;Decreased knowledge of precautions;Pain OT Therapy Diagnosis : Generalized weakness OT Plan OT Frequency: Min 1X/week OT Treatment/Interventions: Self-care/ADL training;Therapeutic activities;Patient/family education OT Recommendation Follow Up Recommendations: Skilled nursing facility Equipment Recommended: Defer to next venue Individuals Consulted Consulted and Agree with Results and Recommendations: Patient OT Goals Acute Rehab OT Goals OT Goal Formulation: With patient Time For Goal Achievement: 7 days ADL Goals Pt Will Perform Grooming: with supervision;Standing at sink ADL Goal: Grooming - Progress: Goal set today Pt Will Transfer to Toilet: with supervision;Ambulation;3-in-1 ADL Goal: Toilet Transfer - Progress: Goal set today Pt Will Perform Toileting - Clothing Manipulation: with supervision;Standing ADL Goal: Toileting - Clothing Manipulation - Progress: Goal set today Pt Will Perform Toileting - Hygiene: with supervision;Standing at 3-in-1/toilet ADL Goal: Toileting - Hygiene - Progress: Goal set  today  OT Evaluation Precautions/Restrictions  Precautions Precautions: Fall Restrictions Weight Bearing Restrictions: Yes RLE Weight Bearing: PWB Prior Functioning Home Living Lives With: Alone Receives Help From: Family Type of Home: House Bathroom Shower/Tub: Nurse, adult Toilet: Standard Home Adaptive Equipment: Tub transfer bench Prior Function Level of Independence: Independent with basic ADLs;Independent with gait;Independent with transfers Able to Take Stairs?: Yes ADL ADL Grooming: Simulated;Set up Where Assessed - Grooming: Sitting, chair;Supported Upper Body Bathing: Simulated;Set up Where Assessed - Upper Body Bathing: Sitting, chair;Supported Lower Body Bathing: Simulated;Moderate assistance Where Assessed - Lower Body Bathing: Sit to stand from chair Upper Body Dressing: Simulated;Minimal assistance;Other (comment) (iv) Where Assessed - Upper Body Dressing: Sit to stand from chair Lower Body Dressing: Simulated;Moderate assistance Where Assessed - Lower Body Dressing: Sit to stand from chair Toilet Transfer: Not assessed Toileting - Clothing Manipulation: Simulated;Minimal assistance;Other (comment) (mod assist sit to stand) Where Assessed - Toileting Clothing Manipulation: Standing Toileting - Hygiene: Simulated;Minimal assistance;Other (comment) (mod to stand) Where Assessed - Toileting Hygiene: Standing Equipment Used: Rolling walker Ambulation Related to ADLs: educated on AE.  Pt has seen before but did not use.   ADL Comments: all adls done.  Reviewed partial weight bearing   Vision/Perception  Vision - History Baseline Vision: Wears glasses all the time Cognition Cognition Arousal/Alertness: Awake/alert Overall Cognitive Status: Appears within functional limits for tasks assessed Orientation Level: Oriented X4 Sensation/Coordination Coordination Gross Motor Movements are Fluid and Coordinated: Yes Extremity Assessment RUE  Assessment RUE Assessment: Within Functional Limits LUE Assessment LUE Assessment: Within Functional Limits Mobility   Transfers Transfers: Yes Sit to Stand: 3: Mod assist;With upper extremity assist;From chair/3-in-1 Sit to Stand Details (indicate cue type and reason)  Min vcs Exercises   End of Session OT - End of Session Activity Tolerance: Patient tolerated treatment well; but decreased activity tolerance  Patient left: in chair;with call bell in reach General Behavior During Session: [facility] for tasks performed Cognition: [facility] for tasks performed [facility], OTR/L [phone] 07/26/2011  Presly Steinruck 07/26/2011, 11:37 AM

## 2011-07-26 NOTE — Progress Notes (Signed)
Physical Therapy Treatment Patient Details Name: Beverly Barnett MRN: [id number] DOB: Apr 18, 1924 Today's Date: 07/26/2011  PT Assessment/Plan  PT - Assessment/Plan Comments on Treatment Session: Pt c/o dizziness with move chair to bed - BP on return to supine 116/62 PT Plan: Discharge plan remains appropriate PT Frequency: Min 5X/week Recommendations for Other Services: OT consult Follow Up Recommendations: Skilled nursing facility Equipment Recommended: Defer to next venue PT Goals  Acute Rehab PT Goals PT Goal Formulation: With patient Time For Goal Achievement: 7 days Pt will go Supine/Side to Sit: with min assist PT Goal: Supine/Side to Sit - Progress: Goal set today Pt will go Sit to Supine/Side: with min assist PT Goal: Sit to Supine/Side - Progress: Goal set today Pt will go Sit to Stand: with min assist PT Goal: Sit to Stand - Progress: Progressing toward goal Pt will go Stand to Sit: with min assist PT Goal: Stand to Sit - Progress: Progressing toward goal Pt will Ambulate: 16 - 50 feet;with min assist;with rolling walker PT Goal: Ambulate - Progress: Goal set today  PT Treatment Precautions/Restrictions  Precautions Precautions: Fall Restrictions Weight Bearing Restrictions: Yes RLE Weight Bearing: Partial weight bearing RLE Partial Weight Bearing Percentage or Pounds: spoke with  Dr Jillyn Hidden in patient's room re: clarification of WB order.  Pt may WB 50% to 100% as tolerated.  Dr Jillyn Hidden states he will place order in chart. Mobility (including Balance) Bed Mobility Sit to Supine: 1: +2 Total assist Sit to Supine - Details (indicate cue type and reason): pt 20% Transfers Sit to Stand: 3: Mod assist;With upper extremity assist;From chair/3-in-1 Sit to Stand Details (indicate cue type and reason): cues for LE position and use of UEs Stand to Sit: 3: Mod assist;With upper extremity assist;With armrests;To bed;To chair/3-in-1 Stand to Sit Details: cues for LE position and use of  UEs Ambulation/Gait Ambulation/Gait Assistance: 1: +2 Total assist Ambulation/Gait Assistance Details (indicate cue type and reason): cues for increased UE WB, posture and sequence Ambulation Distance (Feet): 3 Feet Assistive device: Rolling walker Gait Pattern: Step-to pattern Gait velocity: slow    Exercise    End of Session PT - End of Session Equipment Utilized During Treatment: Gait belt Activity Tolerance: Patient limited by fatigue;Patient limited by pain;Other (comment) (c/o dizziness) Patient left: in bed;with call bell in reach Nurse Communication: Mobility status for transfers;Mobility status for ambulation General Behavior During Session: [facility] for tasks performed Cognition: [facility] for tasks performed  Shahzaib Azevedo 07/26/2011, 1:55 PM

## 2011-07-26 NOTE — Progress Notes (Signed)
Subjective: Feels ok, pain w/ activity, got up with PT  Objective: Vital signs in last 24 hours: Temp:  [97.6 F (36.4 C)-98.4 F (36.9 C)] 97.6 F (36.4 C) (02/15 1433) Pulse Rate:  [66-89] 66  (02/15 1433) Resp:  [16-18] 16  (02/15 1433) BP: (109-126)/(62-74) 116/62 mmHg (02/15 1433) SpO2:  [93 %-96 %] 96 % (02/15 1433) Weight change:  Last BM Date: 07/24/11  Intake/Output from previous day: 02/14 0701 - 02/15 0700 In: 1640 [P.O.:840; I.V.:100; Blood:700] Out: 1950 [Urine:1950] Total I/O In: 480 [P.O.:480] Out: 400 [Urine:400]   Physical Exam: General: Alert, awake, oriented x3, in no acute distress. HEENT: No bruits, no goiter. Heart: Regular rate and rhythm, without murmurs, rubs, gallops. Lungs: Clear to auscultation bilaterally. Abdomen: Soft, nontender, nondistended, positive bowel sounds. Extremities:dressing on lateral leg, no edema with positive pedal pulses. Neuro: Grossly intact, nonfocal.   Lab Results: Basic Metabolic Panel:  Basename 07/26/11 0345 07/25/11 0325  NA 140 138  K 4.0 4.0  CL 107 104  CO2 29 26  GLUCOSE 112* 149*  BUN 13 16  CREATININE 0.70 0.85  CALCIUM 8.0* 7.8*  MG -- --  PHOS -- --   Liver Function Tests: No results found for this basename: AST:2,ALT:2,ALKPHOS:2,BILITOT:2,PROT:2,ALBUMIN:2 in the last 72 hours No results found for this basename: LIPASE:2,AMYLASE:2 in the last 72 hours No results found for this basename: AMMONIA:2 in the last 72 hours CBC:  Basename 07/26/11 0345 07/25/11 0325  WBC 7.7 8.2  NEUTROABS -- --  HGB 9.0* 7.0*  HCT 26.4* 20.5*  MCV 89.5 92.3  PLT 121* 125*   Cardiac Enzymes: No results found for this basename: CKTOTAL:3,CKMB:3,CKMBINDEX:3,TROPONINI:3 in the last 72 hours BNP: No results found for this basename: PROBNP:3 in the last 72 hours D-Dimer: No results found for this basename: DDIMER:2 in the last 72 hours CBG: No results found for this basename: GLUCAP:6 in the last 72  hours Hemoglobin A1C: No results found for this basename: HGBA1C in the last 72 hours Fasting Lipid Panel: No results found for this basename: CHOL,HDL,LDLCALC,TRIG,CHOLHDL,LDLDIRECT in the last 72 hours Thyroid Function Tests: No results found for this basename: TSH,T4TOTAL,FREET4,T3FREE,THYROIDAB in the last 72 hours Anemia Panel: No results found for this basename: VITAMINB12,FOLATE,FERRITIN,TIBC,IRON,RETICCTPCT in the last 72 hours Coagulation:  Basename 07/26/11 0345 07/25/11 0325  LABPROT 15.2 16.2*  INR 1.18 1.27   Urine Drug Screen: Drugs of Abuse  No results found for this basename: labopia,  cocainscrnur,  labbenz,  amphetmu,  thcu,  labbarb    Alcohol Level: No results found for this basename: ETH:2 in the last 72 hours Urinalysis:  Basename 07/23/11 1941  COLORURINE YELLOW  LABSPEC 1.017  PHURINE 6.5  GLUCOSEU NEGATIVE  HGBUR NEGATIVE  BILIRUBINUR NEGATIVE  KETONESUR NEGATIVE  PROTEINUR NEGATIVE  UROBILINOGEN 0.2  NITRITE NEGATIVE  LEUKOCYTESUR NEGATIVE    Recent Results (from the past 240 hour(s))  URINE CULTURE     Status: Normal   Collection Time   07/23/11  7:41 PM      Component Value Range Status Comment   Specimen Description URINE, CATHETERIZED   Final    Special Requests NONE   Final    Culture  Setup Time [id number]   Final    Colony Count NO GROWTH   Final    Culture NO GROWTH   Final    Report Status 07/24/2011 FINAL   Final   SURGICAL PCR SCREEN     Status: Abnormal   Collection Time   07/24/11  9:58 AM      Component Value Range Status Comment   MRSA, PCR INVALID RESULTS, SPECIMEN SENT FOR CULTURE (*) NEGATIVE  Final    Staphylococcus aureus INVALID RESULTS, SPECIMEN SENT FOR CULTURE (*) NEGATIVE  Final   MRSA CULTURE     Status: Normal (Preliminary result)   Collection Time   07/24/11  9:58 AM      Component Value Range Status Comment   Specimen Description NOSE   Final    Special Requests NONE   Final    Culture NO SUSPICIOUS  COLONIES, CONTINUING TO HOLD   Final    Report Status PENDING   Incomplete     Studies/Results: No results found.  Medications: Scheduled Meds:    . docusate sodium  100 mg Oral BID  . enoxaparin  40 mg Subcutaneous Q24H  . losartan  50 mg Oral Daily  . metoprolol tartrate  25 mg Oral BID  . mulitivitamin with minerals  1 tablet Oral Daily  . simvastatin  40 mg Oral q1800  . DISCONTD: metoprolol tartrate  25 mg Oral BID   Continuous Infusions:  PRN Meds:.acetaminophen, acetaminophen, bisacodyl, HYDROcodone-acetaminophen, HYDROmorphone, menthol-cetylpyridinium, methocarbamol(ROBAXIN) IV, methocarbamol, metoCLOPramide (REGLAN) injection, metoCLOPramide, morphine injection, ondansetron (ZOFRAN) IV, ondansetron, phenol, polyethylene glycol  Assessment/Plan: 1. Hip fracture s/p IM nail, POD1 2. Post op anemia: s/p 2units PRBC on  2/14 3. HTN: anti-hypertensives on hold 4. DVT prophylaxis on lovenox  Dispo: per Dr.Beane, unclear about SNF    LOS: 3 days   [employer] [facility] Pager: [phone] 07/26/2011, 5:17 PM

## 2011-07-26 NOTE — Plan of Care (Signed)
Problem: Phase I Progression Outcomes Goal: Voiding-avoid urinary catheter unless indicated Outcome: Progressing Foley D/C 07/26/11 @ 1450

## 2011-07-27 LAB — CBC
HCT: 24.4 % — ABNORMAL LOW (ref 36.0–46.0)
HCT: 23.9 % — ABNORMAL LOW (ref 36.0–46.0)
Hemoglobin: 8.3 g/dL — ABNORMAL LOW (ref 12.0–15.0)
MCH: 31 pg (ref 26.0–34.0)
MCHC: 34 g/dL (ref 30.0–36.0)
MCHC: 33.9 g/dL (ref 30.0–36.0)
MCV: 91 fL (ref 78.0–100.0)
MCV: 90.9 fL (ref 78.0–100.0)
Platelets: 145 K/uL — ABNORMAL LOW (ref 150–400)
RBC: 2.68 MIL/uL — ABNORMAL LOW (ref 3.87–5.11)
RDW: 14.3 % (ref 11.5–15.5)
RDW: 14.2 % (ref 11.5–15.5)
WBC: 8.9 K/uL (ref 4.0–10.5)

## 2011-07-27 LAB — BASIC METABOLIC PANEL WITH GFR
BUN: 13 mg/dL (ref 6–23)
CO2: 30 meq/L (ref 19–32)
Calcium: 8.1 mg/dL — ABNORMAL LOW (ref 8.4–10.5)
Chloride: 106 meq/L (ref 96–112)
Creatinine, Ser: 0.75 mg/dL (ref 0.50–1.10)
GFR calc Af Amer: 87 mL/min — ABNORMAL LOW
GFR calc non Af Amer: 75 mL/min — ABNORMAL LOW
Glucose, Bld: 107 mg/dL — ABNORMAL HIGH (ref 70–99)
Potassium: 3.8 meq/L (ref 3.5–5.1)
Sodium: 139 meq/L (ref 135–145)

## 2011-07-27 LAB — MRSA CULTURE

## 2011-07-27 LAB — PROTIME-INR
INR: 1.13 (ref 0.00–1.49)
Prothrombin Time: 14.7 s (ref 11.6–15.2)

## 2011-07-27 LAB — HEMOGLOBIN AND HEMATOCRIT, BLOOD
HCT: 24.4 % — ABNORMAL LOW (ref 36.0–46.0)
Hemoglobin: 8.1 g/dL — ABNORMAL LOW (ref 12.0–15.0)

## 2011-07-27 MED ORDER — FERROUS SULFATE 325 (65 FE) MG PO TABS
325.0000 mg | ORAL_TABLET | Freq: Three times a day (TID) | ORAL | Status: DC
  Administered 2011-07-27 – 2011-07-29 (×7): 325 mg via ORAL
  Filled 2011-07-27 (×8): qty 1

## 2011-07-27 NOTE — Progress Notes (Signed)
Subjective: No Problems today. Awaiting SNF. Hbg 8.3 will follow.  Objective: Vital signs in last 24 hours: Temp:  [97.6 F (36.4 C)-99.2 F (37.3 C)] 98.5 F (36.9 C) (02/16 0530) Pulse Rate:  [66-99] 99  (02/16 0530) Resp:  [16] 16  (02/16 0530) BP: (93-159)/(53-74) 159/74 mmHg (02/16 0530) SpO2:  [95 %-96 %] 95 % (02/16 0530)  Intake/Output from previous day: 02/15 0701 - 02/16 0700 In: 720 [P.O.:720] Out: 950 [Urine:950] Intake/Output this shift:     Basename 07/27/11 0430 07/26/11 0345 07/25/11 0325  HGB 8.3* 9.0* 7.0*    Basename 07/27/11 0430 07/26/11 0345  WBC 8.9 7.7  RBC 2.68* 2.95*  HCT 24.4* 26.4*  PLT 145* 121*    Basename 07/27/11 0430 07/26/11 0345  NA 139 140  K 3.8 4.0  CL 106 107  CO2 30 29  BUN 13 13  CREATININE 0.75 0.70  GLUCOSE 107* 112*  CALCIUM 8.1* 8.0*    Basename 07/27/11 0430 07/26/11 0345  LABPT -- --  INR 1.13 1.18    Neurovascular intact Dorsiflexion/Plantar flexion intact  Assessment/Plan: SNF,and follow Hbg.   Beverly Barnett 07/27/2011, 8:34 AM

## 2011-07-27 NOTE — Progress Notes (Signed)
Pt seen and examined, afebrile vitals stable, ambulated with PT today Plan: check CBC in am DC planning per Dr. Marita Snellen, MD

## 2011-07-28 LAB — PREPARE RBC (CROSSMATCH)

## 2011-07-28 LAB — BASIC METABOLIC PANEL
BUN: 17 mg/dL (ref 6–23)
Calcium: 8.1 mg/dL — ABNORMAL LOW (ref 8.4–10.5)
Creatinine, Ser: 0.7 mg/dL (ref 0.50–1.10)
GFR calc Af Amer: 89 mL/min — ABNORMAL LOW (ref >90)
GFR calc non Af Amer: 77 mL/min — ABNORMAL LOW (ref >90)

## 2011-07-28 LAB — CBC
HCT: 21.8 % — ABNORMAL LOW (ref 36.0–46.0)
MCH: 30.5 pg (ref 26.0–34.0)
MCHC: 33.5 g/dL (ref 30.0–36.0)
MCV: 91.2 fL (ref 78.0–100.0)
RDW: 14.1 % (ref 11.5–15.5)

## 2011-07-28 MED ORDER — FUROSEMIDE 10 MG/ML IJ SOLN
20.0000 mg | Freq: Every day | INTRAMUSCULAR | Status: DC
  Administered 2011-07-28 – 2011-07-29 (×2): 20 mg via INTRAVENOUS
  Filled 2011-07-28 (×2): qty 2

## 2011-07-28 MED ORDER — POLYETHYLENE GLYCOL 3350 17 G PO PACK
17.0000 g | PACK | Freq: Every day | ORAL | Status: DC
  Administered 2011-07-28 – 2011-07-29 (×2): 17 g via ORAL
  Filled 2011-07-28 (×2): qty 1

## 2011-07-28 NOTE — Discharge Planning (Signed)
CSW visited with patient to provide bed offers. Patient's daughter was present during this visit. Patient's daughter and patient expressed interest in [facility], but believes they were advised they were not in network.  They also are pleased with [facility] bed offer, however, patient wants a private room and will only go there if that is available. Will pass off to CSW to follow up.  Ileene Hutchinson , MSW, LCSWA 07/28/2011 2:35 PM [phone]

## 2011-07-28 NOTE — Progress Notes (Signed)
Subjective: 4 Days Post-Op Procedure(s) (LRB): INTRAMEDULLARY (IM) NAIL FEMORAL (Right) Patient reports pain as mild.   Doing well with PT. Objective: Vital signs in last 24 hours: Temp:  [98.1 F (36.7 C)-98.8 F (37.1 C)] 98.1 F (36.7 C) (02/17 0505) Pulse Rate:  [76-102] 76  (02/17 0505) Resp:  [16-20] 20  (02/17 0505) BP: (100-129)/(54-72) 125/66 mmHg (02/17 0505) SpO2:  [96 %-98 %] 98 % (02/17 0505)  Intake/Output from previous day: 02/16 0701 - 02/17 0700 In: 460 [P.O.:460] Out: 900 [Urine:900] Intake/Output this shift:     Basename 07/28/11 0501 07/27/11 1510 07/27/11 0951 07/27/11 0430 07/26/11 0345  HGB 7.3* 8.1* 8.1* 8.3* 9.0*    Basename 07/28/11 0501 07/27/11 1510 07/27/11 0951  WBC 9.0 -- 10.0  RBC 2.39* -- 2.63*  HCT 21.8* 24.4* --  PLT 181 -- 171    Basename 07/28/11 0501 07/27/11 0430  NA 141 139  K 3.7 3.8  CL 107 106  CO2 28 30  BUN 17 13  CREATININE 0.70 0.75  GLUCOSE 108* 107*  CALCIUM 8.1* 8.1*    Basename 07/28/11 0501 07/27/11 0430  LABPT -- --  INR 1.06 1.13    Wound dressed and dry.  NVI distally.  Assessment/Plan: 4 Days Post-Op Procedure(s) (LRB): INTRAMEDULLARY (IM) NAIL FEMORAL (Right) Up with therapy  Alasdair Kleve 07/28/2011, 9:33 AM

## 2011-07-28 NOTE — Progress Notes (Signed)
Physical Therapy Treatment Patient Details Name: Beverly Barnett MRN: [id number] DOB: 28-Jun-1923 Today's Date: 07/28/2011  PT Assessment/Plan  PT - Assessment/Plan PT Plan: Discharge plan remains appropriate PT Frequency: Min 5X/week Recommendations for Other Services: OT consult Follow Up Recommendations: Skilled nursing facility Equipment Recommended: Defer to next venue PT Goals  Acute Rehab PT Goals PT Goal Formulation: With patient Time For Goal Achievement: 7 days Pt will go Supine/Side to Sit: with min assist PT Goal: Supine/Side to Sit - Progress: Progressing toward goal Pt will go Sit to Stand: with min assist PT Goal: Sit to Stand - Progress: Progressing toward goal Pt will go Stand to Sit: with min assist PT Goal: Stand to Sit - Progress: Progressing toward goal Pt will Ambulate: 16 - 50 feet;with min assist;with rolling walker PT Goal: Ambulate - Progress: Progressing toward goal  PT Treatment Precautions/Restrictions  Precautions Precautions: Fall Restrictions Weight Bearing Restrictions: Yes RLE Weight Bearing: Partial weight bearing RLE Partial Weight Bearing Percentage or Pounds: 50-100% Mobility (including Balance) Bed Mobility Supine to Sit: 4: Min assist Supine to Sit Details (indicate cue type and reason): cues for sequence/technique Transfers Sit to Stand: 4: Min assist;3: Mod assist;From bed;With upper extremity assist Sit to Stand Details (indicate cue type and reason): cues for use of UEs Stand to Sit: 4: Min assist;With upper extremity assist;3: Mod assist;To chair/3-in-1;With armrests Stand to Sit Details: cues for use of UEs and LE position Ambulation/Gait Ambulation/Gait Assistance: 4: Min assist Ambulation/Gait Assistance Details (indicate cue type and reason): cues for posture, position from RW and stride length Ambulation Distance (Feet): 72 Feet Assistive device: Rolling walker Gait Pattern: Step-to pattern Gait velocity: slow    Exercise      End of Session PT - End of Session Activity Tolerance: Patient tolerated treatment well Patient left: in chair;with call bell in reach;with family/visitor present Nurse Communication: Mobility status for transfers;Mobility status for ambulation General Behavior During Session: [facility] for tasks performed Cognition: [facility] for tasks performed  Arcangel Minion 07/28/2011, 3:25 PM

## 2011-07-28 NOTE — Progress Notes (Signed)
Physical Therapy Treatment Patient Details Name: Beverly Barnett MRN: [id number] DOB: 08-22-1923 Today's Date: 07/28/2011  PT Assessment/Plan  PT - Assessment/Plan Comments on Treatment Session: OOB deferred pending blood transfusion PT Plan: Discharge plan remains appropriate PT Frequency: Min 5X/week Follow Up Recommendations: Skilled nursing facility Equipment Recommended: Defer to next venue PT Goals  Acute Rehab PT Goals PT Goal Formulation: With patient Time For Goal Achievement: 7 days  PT Treatment Precautions/Restrictions  Precautions Precautions: Fall Restrictions Weight Bearing Restrictions: Yes RLE Weight Bearing: Partial weight bearing RLE Partial Weight Bearing Percentage or Pounds: 50-100% Mobility (including Balance)      Exercise  General Exercises - Lower Extremity Ankle Circles/Pumps: AROM;Both;20 reps;Supine Quad Sets: AROM;15 reps;Supine;Both Gluteal Sets: 5 reps;10 reps;Both;AROM Short Arc Quad: AROM;AAROM;10 reps;5 reps;Right;Supine Heel Slides: 20 reps;AAROM;Supine;Right Hip ABduction/ADduction: AAROM;20 reps;Right;Supine End of Session PT - End of Session Activity Tolerance: Patient tolerated treatment well Patient left: in bed;with call bell in reach General Behavior During Session: [facility] for tasks performed Cognition: [facility] for tasks performed  Beverly Barnett 07/28/2011, 12:08 PM

## 2011-07-28 NOTE — Progress Notes (Signed)
Subjective: Feels ok, pain w/ activity,working with PT No BM since Tuesday No over blood loss Getting blood currently  Objective: Vital signs in last 24 hours: Temp:  [98.1 F (36.7 C)-98.8 F (37.1 C)] 98.1 F (36.7 C) (02/17 1102) Pulse Rate:  [76-97] 79  (02/17 1102) Resp:  [16-20] 16  (02/17 1102) BP: (100-129)/(54-72) 107/62 mmHg (02/17 1102) SpO2:  [96 %-98 %] 98 % (02/17 1032) Weight change:  Last BM Date: 07/23/11  Intake/Output from previous day: 02/16 0701 - 02/17 0700 In: 460 [P.O.:460] Out: 900 [Urine:900]   Physical Exam: General: Alert, awake, oriented x3, in no acute distress. HEENT: No bruits, no goiter. Heart: Regular rate and rhythm, without murmurs, rubs, gallops. Lungs: Clear to auscultation bilaterally. Abdomen: Soft, nontender, nondistended, positive bowel sounds. Extremities:dressing on lateral leg, no edema with positive pedal pulses. Neuro: Grossly intact, nonfocal.   Lab Results: Basic Metabolic Panel:  Basename 07/28/11 0501 07/27/11 0430  NA 141 139  K 3.7 3.8  CL 107 106  CO2 28 30  GLUCOSE 108* 107*  BUN 17 13  CREATININE 0.70 0.75  CALCIUM 8.1* 8.1*  MG -- --  PHOS -- --   Liver Function Tests: No results found for this basename: AST:2,ALT:2,ALKPHOS:2,BILITOT:2,PROT:2,ALBUMIN:2 in the last 72 hours No results found for this basename: LIPASE:2,AMYLASE:2 in the last 72 hours No results found for this basename: AMMONIA:2 in the last 72 hours CBC:  Basename 07/28/11 0501 07/27/11 1510 07/27/11 0951  WBC 9.0 -- 10.0  NEUTROABS -- -- --  HGB 7.3* 8.1* --  HCT 21.8* 24.4* --  MCV 91.2 -- 90.9  PLT 181 -- 171   Cardiac Enzymes: No results found for this basename: CKTOTAL:3,CKMB:3,CKMBINDEX:3,TROPONINI:3 in the last 72 hours BNP: No results found for this basename: PROBNP:3 in the last 72 hours D-Dimer: No results found for this basename: DDIMER:2 in the last 72 hours CBG: No results found for this basename: GLUCAP:6 in the  last 72 hours Hemoglobin A1C: No results found for this basename: HGBA1C in the last 72 hours Fasting Lipid Panel: No results found for this basename: CHOL,HDL,LDLCALC,TRIG,CHOLHDL,LDLDIRECT in the last 72 hours Thyroid Function Tests: No results found for this basename: TSH,T4TOTAL,FREET4,T3FREE,THYROIDAB in the last 72 hours Anemia Panel: No results found for this basename: VITAMINB12,FOLATE,FERRITIN,TIBC,IRON,RETICCTPCT in the last 72 hours Coagulation:  Basename 07/28/11 0501 07/27/11 0430  LABPROT 14.0 14.7  INR 1.06 1.13   Urine Drug Screen: Drugs of Abuse  No results found for this basename: labopia,  cocainscrnur,  labbenz,  amphetmu,  thcu,  labbarb    Alcohol Level: No results found for this basename: ETH:2 in the last 72 hours Urinalysis: No results found for this basename: COLORURINE:2,APPERANCEUR:2,LABSPEC:2,PHURINE:2,GLUCOSEU:2,HGBUR:2,BILIRUBINUR:2,KETONESUR:2,PROTEINUR:2,UROBILINOGEN:2,NITRITE:2,LEUKOCYTESUR:2 in the last 72 hours  Recent Results (from the past 240 hour(s))  URINE CULTURE     Status: Normal   Collection Time   07/23/11  7:41 PM      Component Value Range Status Comment   Specimen Description URINE, CATHETERIZED   Final    Special Requests NONE   Final    Culture  Setup Time [id number]   Final    Colony Count NO GROWTH   Final    Culture NO GROWTH   Final    Report Status 07/24/2011 FINAL   Final   SURGICAL PCR SCREEN     Status: Abnormal   Collection Time   07/24/11  9:58 AM      Component Value Range Status Comment   MRSA, PCR INVALID RESULTS, SPECIMEN SENT  FOR CULTURE (*) NEGATIVE  Final    Staphylococcus aureus INVALID RESULTS, SPECIMEN SENT FOR CULTURE (*) NEGATIVE  Final   MRSA CULTURE     Status: Normal   Collection Time   07/24/11  9:58 AM      Component Value Range Status Comment   Specimen Description NOSE   Final    Special Requests NONE   Final    Culture     Final    Value: NO STAPHYLOCOCCUS AUREUS ISOLATED     Note: No MRSA  Isolated   Report Status 07/27/2011 FINAL   Final     Studies/Results: No results found.  Medications: Scheduled Meds:    . docusate sodium  100 mg Oral BID  . enoxaparin  40 mg Subcutaneous Q24H  . ferrous sulfate  325 mg Oral TID WC  . furosemide  20 mg Intravenous Daily  . losartan  50 mg Oral Daily  . metoprolol tartrate  25 mg Oral BID  . mulitivitamin with minerals  1 tablet Oral Daily  . polyethylene glycol  17 g Oral Daily  . simvastatin  40 mg Oral q1800   Continuous Infusions:  PRN Meds:.acetaminophen, acetaminophen, bisacodyl, HYDROcodone-acetaminophen, HYDROmorphone, menthol-cetylpyridinium, methocarbamol(ROBAXIN) IV, methocarbamol, metoCLOPramide (REGLAN) injection, metoCLOPramide, morphine injection, ondansetron (ZOFRAN) IV, ondansetron, phenol, polyethylene glycol  Assessment/Plan: 1. Hip fracture s/p IM nail, POD4 2. Post op anemia: s/p 2units PRBC on  2/14, for 2 more units of blood today per Ortho Will give lasix between transfusion No overt blood loss 3. HTN: BP stable, anti-hypertensives on hold 4. DVT prophylaxis on lovenox  Dispo: per Dr.Beane, considering SNF    LOS: 5 days   [facility] Pager: [phone] 07/28/2011, 11:52 AM

## 2011-07-29 LAB — CBC
HCT: 28.5 % — ABNORMAL LOW (ref 36.0–46.0)
Hemoglobin: 9.4 g/dL — ABNORMAL LOW (ref 12.0–15.0)
MCV: 86.9 fL (ref 78.0–100.0)
RBC: 3.28 MIL/uL — ABNORMAL LOW (ref 3.87–5.11)
WBC: 10.1 10*3/uL (ref 4.0–10.5)

## 2011-07-29 LAB — TYPE AND SCREEN
ABO/RH(D): A POS
Antibody Screen: NEGATIVE
Unit division: 0
Unit division: 0

## 2011-07-29 LAB — BASIC METABOLIC PANEL
BUN: 19 mg/dL (ref 6–23)
CO2: 28 mEq/L (ref 19–32)
Chloride: 106 mEq/L (ref 96–112)
Creatinine, Ser: 0.74 mg/dL (ref 0.50–1.10)
GFR calc Af Amer: 87 mL/min — ABNORMAL LOW (ref >90)
Glucose, Bld: 114 mg/dL — ABNORMAL HIGH (ref 70–99)
Potassium: 3.7 mEq/L (ref 3.5–5.1)

## 2011-07-29 LAB — HEMOGLOBIN AND HEMATOCRIT, BLOOD
HCT: 28 % — ABNORMAL LOW (ref 36.0–46.0)
Hemoglobin: 9.5 g/dL — ABNORMAL LOW (ref 12.0–15.0)

## 2011-07-29 MED ORDER — HYDROCODONE-ACETAMINOPHEN 5-325 MG PO TABS: 1.0000 | ORAL_TABLET | ORAL | Status: AC | PRN

## 2011-07-29 MED ORDER — ENOXAPARIN SODIUM 40 MG/0.4ML ~~LOC~~ SOLN: 40.0000 mg | SUBCUTANEOUS | Status: AC

## 2011-07-29 NOTE — Progress Notes (Signed)
Pt seen and examined Doing well, Hemoglobin improved after transfusion yesterday DC Plans for today per Dr.Beane  Zannie Cove, MD

## 2011-07-29 NOTE — Progress Notes (Signed)
Subjective: 5 Days Post-Op Procedure(s) (LRB): INTRAMEDULLARY (IM) NAIL FEMORAL (Right) Patient reports pain as 2 on 0-10 scale.   Denies CP or SOB.  Voiding without difficulty. Positive flatus. Objective: Vital signs in last 24 hours: Temp:  [98 F (36.7 C)-99.5 F (37.5 C)] 98 F (36.7 C) (02/18 0505) Pulse Rate:  [71-101] 76  (02/18 0505) Resp:  [16-20] 20  (02/18 0505) BP: (107-152)/(62-73) 144/73 mmHg (02/18 0505) SpO2:  [93 %-100 %] 93 % (02/18 0505)  Intake/Output from previous day: 02/17 0701 - 02/18 0700 In: 1736.7 [P.O.:840; I.V.:250; Blood:646.7] Out: 1400 [Urine:1400] Intake/Output this shift: Total I/O In: 836.7 [P.O.:480; Blood:356.7] Out: -    Basename 07/29/11 0447 07/29/11 0004 07/28/11 0501 07/27/11 1510 07/27/11 0951  HGB 9.4* 9.5* 7.3* 8.1* 8.1*    Basename 07/29/11 0447 07/29/11 0004 07/28/11 0501  WBC 10.1 -- 9.0  RBC 3.28* -- 2.39*  HCT 28.5* 28.0* --  PLT 213 -- 181    Basename 07/29/11 0447 07/28/11 0501  NA 139 141  K 3.7 3.7  CL 106 107  CO2 28 28  BUN 19 17  CREATININE 0.74 0.70  GLUCOSE 114* 108*  CALCIUM 8.4 8.1*    Basename 07/29/11 0447 07/28/11 0501  LABPT -- --  INR 1.09 1.06    Neurologically intact Neurovascular intact Sensation intact distally Intact pulses distally No cellulitis present Compartment soft no DVT  Assessment/Plan: 5 Days Post-Op Procedure(s) (LRB): INTRAMEDULLARY (IM) NAIL FEMORAL (Right) Discharge to SNF PWB50% lovenox for 10 days then 325mg  ASA QD. F/U/ DR Courtni Balash 10days. Instructions given. H/H up  Rumaldo Difatta C 07/29/2011, 6:27 AM

## 2011-07-29 NOTE — Progress Notes (Signed)
Occupational Therapy Treatment Patient Details Name: MODELLE VOLLMER MRN: [id number] DOB: [birth year] Today's Date: 07/29/2011  OT Assessment/Plan OT Assessment/Plan OT Plan: Discharge plan remains appropriate OT Frequency: Min 1X/week Follow Up Recommendations: Skilled nursing facility Equipment Recommended: Defer to next venue OT Goals ADL Goals Pt Will Perform Grooming: with supervision;Standing at sink ADL Goal: Grooming - Progress: Met Pt Will Transfer to Toilet: with supervision;Ambulation;3-in-1 ADL Goal: Toilet Transfer - Progress: Progressing toward goals Pt Will Perform Toileting - Hygiene: with supervision;Standing at 3-in-1/toilet ADL Goal: Toileting - Hygiene - Progress: Progressing toward goals  OT Treatment Precautions/Restrictions  Restrictions Weight Bearing Restrictions: Yes RLE Weight Bearing: Partial weight bearing RLE Partial Weight Bearing Percentage or Pounds: 50-100%   ADL ADL Grooming: Performed;Wash/dry hands;Supervision/safety Where Assessed - Grooming: Standing at sink Toilet Transfer: Performed;Minimal assistance;Other (comment) (min guard) Toilet Transfer Details (indicate cue type and reason):  (min cues for step sequence and length) Toilet Transfer Method: Proofreader: Set designer - Hygiene: Performed;Minimal assistance;Other (comment) (min guard) Ambulation Related to ADLs: min cues for step sequence and step length ADL Comments: pt followed pwb without cues Mobility  Bed Mobility Supine to Sit: 4: Min assist Transfers Sit to Stand: From chair/3-in-1;With upper extremity assist;4: Min assist;Other (comment) (min guard) Exercises    End of Session OT - End of Session Activity Tolerance: Patient tolerated treatment well Patient left: in bed;with call bell in reach General Behavior During Session: [facility] for tasks performed Cognition: [facility] for tasks performed [facility],  OTR/L [phone] 07/29/2011 Porshea Janowski  07/29/2011, 8:56 AM

## 2011-07-29 NOTE — Progress Notes (Signed)
Pt to [facility] today for [facility] s[facility] placement.

## 2011-07-29 NOTE — Discharge Summary (Signed)
Patient ID: Beverly Barnett MRN: [id number] DOB/AGE: [birth year] 78 y.o.  Admit date: 07/23/2011 Discharge date: 07/29/2011  Admission Diagnoses:  Discharge Diagnoses:  Active Problems:  * No active hospital problems. *    Discharged Condition: good  Hospital Course: No complications. Required 4 units PRBC. Tolerated PT well.  Basename 07/29/11 0447 07/29/11 0004 07/28/11 0501  WBC 10.1 -- 9.0  HCT 28.5* 28.0* --  PLT 213 -- 181    Basename 07/29/11 0447 07/28/11 0501  NA 139 141  K 3.7 3.7  CL 106 107  CO2 28 28  GLUCOSE 114* 108*  BUN 19 17  CREATININE 0.74 0.70  CALCIUM 8.4 8.1*    Procedure Note:IM nailing right hip  Consults: rehabilitation medicine and medicine  Significant Diagnostic Studies: labs: HCT 28.1  Treatments: surgery: transfusion PRBC   Disposition: Good on D/C  Medication List  As of 07/29/2011  6:31 AM   ASK your doctor about these medications         aspirin EC 81 MG tablet   Take 81 mg by mouth daily.      Co Q 10 100 MG Caps   Take 1 tablet by mouth 2 (two) times daily.      losartan 50 MG tablet   Commonly known as: COZAAR   Take 50 mg by mouth daily.      metoprolol 50 MG tablet   Commonly known as: LOPRESSOR   Take 25 mg by mouth 2 (two) times daily.      mulitivitamin with minerals Tabs   Take 1-2 tablets by mouth 2 (two) times daily. 2 in am, 1 in pm      simvastatin 40 MG tablet   Commonly known as: ZOCOR   Take 40 mg by mouth daily.      Strontium Chloride Crys   Take 1 tablet by mouth daily.             SignedJavier Docker 07/29/2011, 6:31 AM

## 2011-07-29 NOTE — Progress Notes (Signed)
Patient being transferred to [facility] skilled nursing facility. Given pain medication prior to leaving. Left unit via stretcher for ambulance transport. Daughter with patient. Iv site removed, catheter tip intact

## 2011-08-12 ENCOUNTER — Encounter (HOSPITAL_COMMUNITY): Payer: Self-pay | Admitting: Specialist

## 2011-09-13 IMAGING — CR DG SHOULDER 2+V*L*
1 series · 1 of 1 positions shown · non-contrast
Comparison: Radiographs 03/03/2010

CLINICAL DATA: Left proximal humeral fracture.

LEFT SHOULDER - 2+ VIEW

[view not recorded]
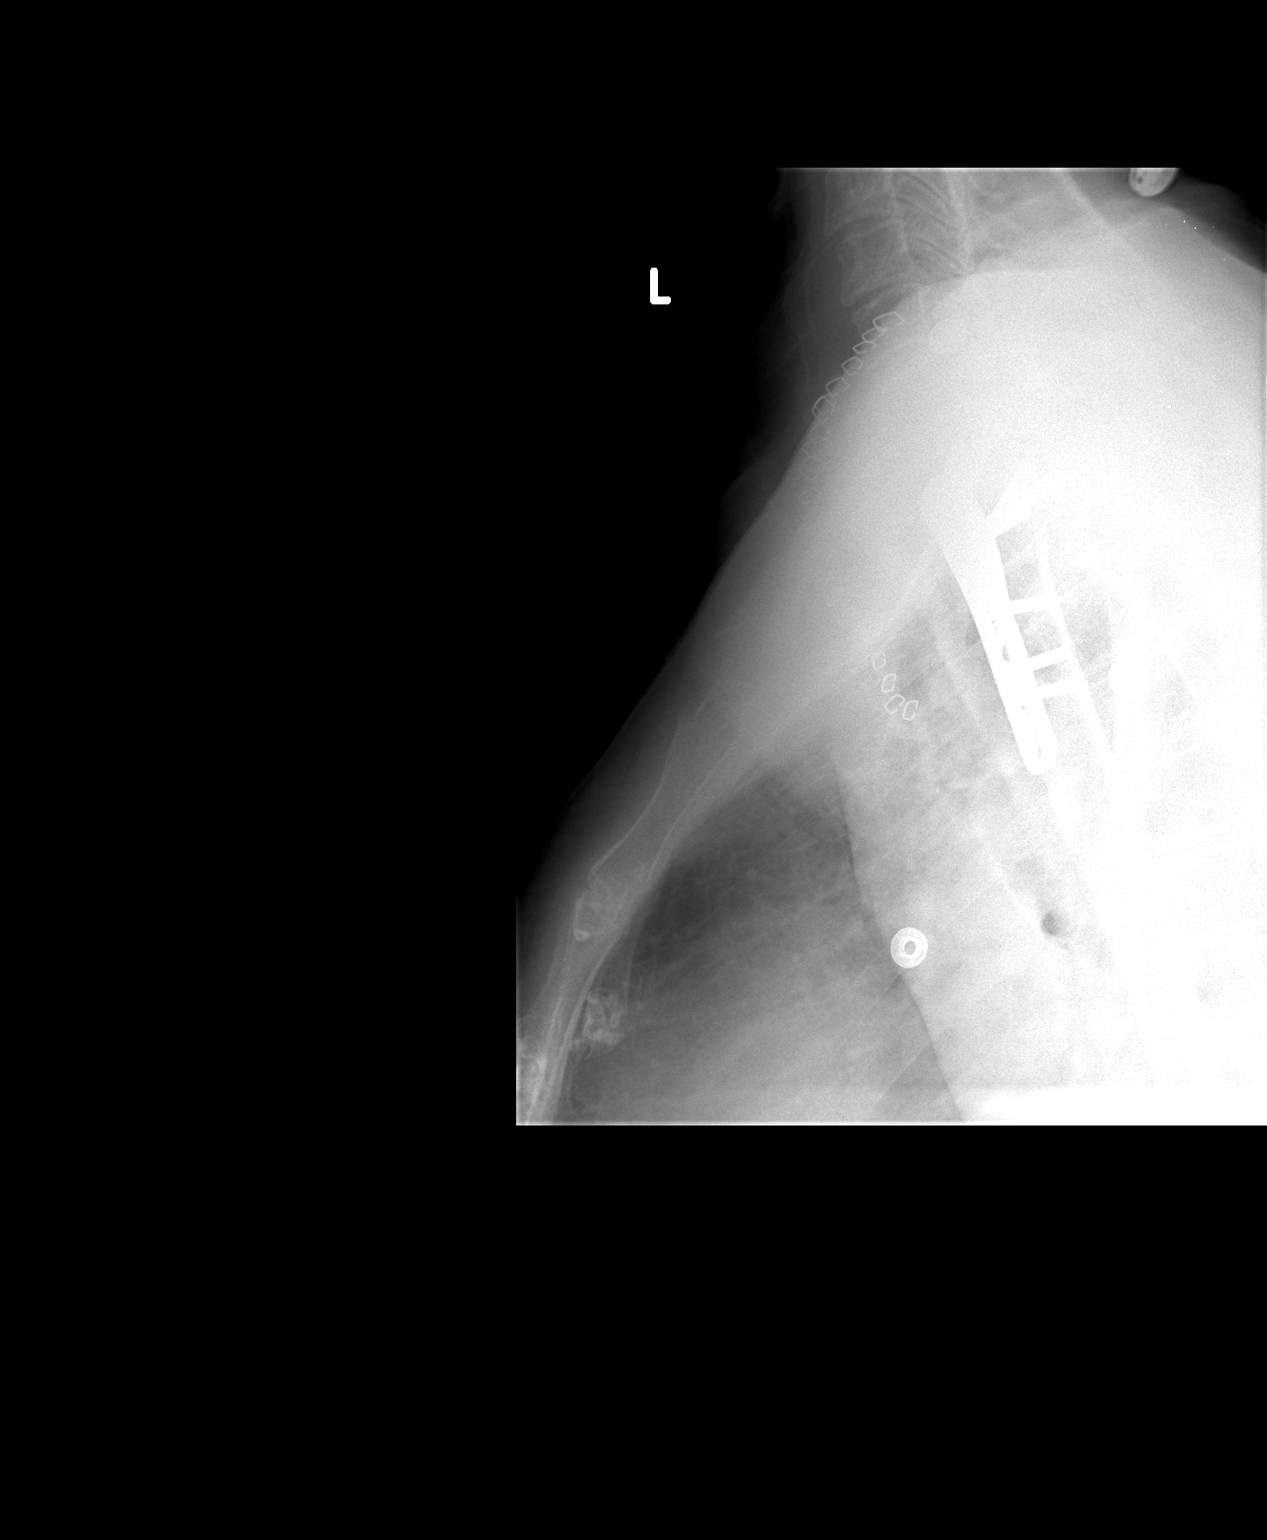

[1 of 1 positions shown; findings below may reference images not displayed]

FINDINGS: There is a side plate and multiple screws transfixing the
proximal humeral head/neck fractures with near anatomic alignment.
IMPRESSION: Internal fixation of left humeral head/neck fractures.

## 2012-07-07 LAB — HM MAMMOGRAPHY

## 2012-07-07 LAB — HM COLONOSCOPY

## 2012-07-10 ENCOUNTER — Other Ambulatory Visit: Payer: Self-pay | Admitting: Internal Medicine

## 2012-07-10 DIAGNOSIS — Z780 Asymptomatic menopausal state: Secondary | ICD-10-CM

## 2012-07-23 ENCOUNTER — Other Ambulatory Visit: Payer: Medicare Other

## 2013-01-01 ENCOUNTER — Other Ambulatory Visit: Payer: Self-pay | Admitting: Internal Medicine

## 2013-01-01 ENCOUNTER — Other Ambulatory Visit: Payer: Medicare Other

## 2013-01-04 ENCOUNTER — Encounter: Payer: Self-pay | Admitting: *Deleted

## 2013-01-05 ENCOUNTER — Encounter: Payer: Self-pay | Admitting: Internal Medicine

## 2013-01-05 ENCOUNTER — Ambulatory Visit (INDEPENDENT_AMBULATORY_CARE_PROVIDER_SITE_OTHER): Payer: Medicare Other | Admitting: Internal Medicine

## 2013-01-05 VITALS — BP 148/86 | HR 65 | Temp 98.0°F | Resp 14 | Ht 60.0 in | Wt 118.0 lb

## 2013-01-05 DIAGNOSIS — I10 Essential (primary) hypertension: Secondary | ICD-10-CM

## 2013-01-05 DIAGNOSIS — M810 Age-related osteoporosis without current pathological fracture: Secondary | ICD-10-CM

## 2013-01-05 DIAGNOSIS — F0390 Unspecified dementia without behavioral disturbance: Secondary | ICD-10-CM | POA: Insufficient documentation

## 2013-01-05 DIAGNOSIS — E7800 Pure hypercholesterolemia, unspecified: Secondary | ICD-10-CM | POA: Insufficient documentation

## 2013-01-05 MED ORDER — METOPROLOL TARTRATE 50 MG PO TABS: 50.0000 mg | ORAL_TABLET | Freq: Two times a day (BID) | ORAL | Status: DC

## 2013-01-05 MED ORDER — DONEPEZIL HCL 5 MG PO TABS: 5.0000 mg | ORAL_TABLET | Freq: Every day | ORAL | Status: DC

## 2013-01-05 NOTE — Progress Notes (Signed)
Patient ID: Beverly Barnett, female   DOB: 09-28-1923, 79 y.o.   MRN: [id number]  Chief Complaint  Patient presents with  . Medical Managment of Chronic Issues   No Known Allergies  HPI- 79y/o female patient is here today with her son in law for routine follow up. She has been more forgetful recently with her long term memory as well as short term as per son in law. Her bp has been running high at home with SBP mostly > 150. She has been on lopressor 25 bid and losartan and has been taking losartan 50 mg daily instead with losartan. She is compliant with her other medications and careful with her meals. Reviewed her labs  Review of Systems  Constitutional: Negative for fever, chills, weight loss and malaise/fatigue.  HENT: Negative for congestion.   Eyes: Negative for blurred vision.  Respiratory: Negative for cough and shortness of breath.   Cardiovascular: Negative for chest pain, palpitations and leg swelling.  Gastrointestinal: Negative for heartburn, nausea, vomiting and abdominal pain.  Genitourinary: Negative for dysuria.  Musculoskeletal: Negative for myalgias.  Skin: Negative for rash.  Neurological: Negative for dizziness, seizures, loss of consciousness and headaches.  Psychiatric/Behavioral: Positive for memory loss. Negative for depression. The patient does not have insomnia.    Past Medical History  Diagnosis Date  . Hypertension   . High cholesterol   . Osteoporosis   . Type II or unspecified type diabetes mellitus without mention of complication, not stated as uncontrolled   . Hyperosmolality and/or hypernatremia   . Dysphagia, unspecified(787.20)   . Other atopic dermatitis and related conditions   . Encounter for long-term (current) use of other medications     BP 148/86  Pulse 65  Temp(Src) 98 F (36.7 C) (Oral)  Resp 14  Ht 5' (1.524 m)  Wt 118 lb (53.524 kg)  BMI 23.05 kg/m2  Physical Exam  Constitutional: She is oriented to person, place, and time. She  appears well-developed and well-nourished. No distress.  HENT:  Head: Normocephalic and atraumatic.  Mouth/Throat: Oropharynx is clear and moist. No oropharyngeal exudate.  Has partial plates  Eyes: Conjunctivae and EOM are normal. Pupils are equal, round, and reactive to light.  Neck: Normal range of motion. Neck supple. No JVD present. No thyromegaly present.  Cardiovascular: Normal rate, regular rhythm and normal heart sounds.   Systolic murmur  Pulmonary/Chest: Effort normal and breath sounds normal. No respiratory distress. She has no wheezes. She has no rales. She exhibits no tenderness.  Abdominal: Soft. Bowel sounds are normal. She exhibits no distension.  Musculoskeletal: Normal range of motion. She exhibits no edema and no tenderness.  Using a cane, has mild scoliosis  Lymphadenopathy:    She has no cervical adenopathy.  Neurological: She is alert and oriented to person, place, and time. No cranial nerve deficit. Coordination normal.  Skin: Skin is warm and dry. No rash noted. She is not diaphoretic. No erythema. No pallor.  Psychiatric: She has a normal mood and affect. Her behavior is normal. Judgment and thought content normal.   Labs reviewed-  02/26/2011  CBC: wbc 6.1, rbc 4.36, Hemoglobin 13.1 CMP: glucose 105, BUN 15, Creatinine 0.90 A1C: 5.9 Lipid: Cholesterol 187, Triglycerides 146, HDL 53, LDL 105 07/03/2012 CBC; WBC 7.0, RBC 4.38, HGB 13.6 CMP; Glucose 116, BUN 19, Creatinine 0.96 A1c 5.8 Lipid Panel; Cholesterol 181, Triglycerides 139, HDL 59, LDL 94 TSH 3.530 Vit D 56.4  01/01/13 glu 103, bun 19, cr 0.90, na 143, k  4.2, co2 24, ca 9.7, t.chol 217, tg 180, hdl 52, ldl 129, a1c 5.9  ASSESSMENT/PLAN  Dementia- with MMSE 26/30 and is able to carry out routine conversation. With hx of HTN and hyperlipidemia, could be a mixture of vascular and senile dementia. Reversible causes are normal. Will start her on aricept 5 mg daily for now to help prevent detoriation of  her memory loss. Monitor clinically. Common side effects explained  HTN- bp needs to be better controlled with her vascular dementia in picture. Will change her lopressor to 50 mg bid for now and continue losartan. Check bp at home and review average in next office visit and titrate dose further  Osteoporosis- continue fosamax, monitor clinically  Hyperlipidemia- continue zocor, reviewed flp

## 2013-01-08 LAB — HGB A1C W/O EAG: Hgb A1c MFr Bld: 5.9 % — ABNORMAL HIGH (ref 4.8–5.6)

## 2013-01-08 LAB — BASIC METABOLIC PANEL
CO2: 24 mmol/L (ref 18–29)
Calcium: 9.7 mg/dL (ref 8.6–10.2)
Creatinine, Ser: 0.9 mg/dL (ref 0.57–1.00)
GFR calc non Af Amer: 58 mL/min/{1.73_m2} — ABNORMAL LOW (ref >59)
Glucose: 103 mg/dL — ABNORMAL HIGH (ref 65–99)
Sodium: 143 mmol/L (ref 134–144)

## 2013-01-08 LAB — LIPID PANEL WITH LDL/HDL RATIO
Cholesterol, Total: 217 mg/dL — ABNORMAL HIGH (ref 100–199)
HDL: 52 mg/dL (ref >39)

## 2013-02-16 ENCOUNTER — Ambulatory Visit (INDEPENDENT_AMBULATORY_CARE_PROVIDER_SITE_OTHER): Payer: Medicare Other | Admitting: Internal Medicine

## 2013-02-16 ENCOUNTER — Encounter: Payer: Self-pay | Admitting: Internal Medicine

## 2013-02-16 VITALS — BP 120/74 | HR 97 | Temp 97.4°F | Resp 18 | Ht 60.0 in | Wt 118.0 lb

## 2013-02-16 DIAGNOSIS — F0150 Vascular dementia without behavioral disturbance: Secondary | ICD-10-CM

## 2013-02-16 DIAGNOSIS — F0280 Dementia in other diseases classified elsewhere without behavioral disturbance: Secondary | ICD-10-CM

## 2013-02-16 DIAGNOSIS — E785 Hyperlipidemia, unspecified: Secondary | ICD-10-CM | POA: Insufficient documentation

## 2013-02-16 DIAGNOSIS — M810 Age-related osteoporosis without current pathological fracture: Secondary | ICD-10-CM

## 2013-02-16 DIAGNOSIS — I10 Essential (primary) hypertension: Secondary | ICD-10-CM

## 2013-02-16 NOTE — Progress Notes (Signed)
Patient ID: Beverly Barnett, female   DOB: [birth year], 79 y.o.   MRN: [id number]  Chief Complaint  Patient presents with  . Follow-up    HTN, Memory   No Known Allergies  HPI- 79y/o female patient is here today by herself. 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 No new behavioral changes noted by patient herself. She denies any fall. She has been taking her medications and her bp is better controlled in office today.   Review of Systems  Constitutional: Negative for fever, chills, weight loss and malaise/fatigue.  HENT: Negative for congestion.   Eyes: Negative for blurred vision.  Respiratory: Negative for cough and shortness of breath.   Cardiovascular: Negative for chest pain, palpitations and leg swelling.  Gastrointestinal: Negative for heartburn, nausea, vomiting and abdominal pain.  Genitourinary: Negative for dysuria.  Musculoskeletal: Negative for myalgias.  Skin: Negative for rash.  Neurological: Negative for dizziness, seizures, loss of consciousness and headaches.  Psychiatric/Behavioral: Positive for memory loss but able to carry out a pleasant conversation. Negative for depression. The patient does not have insomnia.     Past Medical History  Diagnosis Date  . Hypertension   . High cholesterol   . Osteoporosis   . Type II or unspecified type diabetes mellitus without mention of complication, not stated as uncontrolled   . Hyperosmolality and/or hypernatremia   . Dysphagia, unspecified(787.20)   . Other atopic dermatitis and related conditions   . Encounter for long-term (current) use of other medications    Current Outpatient Prescriptions on File Prior to Visit  Medication Sig Dispense Refill  . alendronate (FOSAMAX) 70 MG tablet Take 70 mg by mouth every 7 (seven) days. Take with a full glass of water on an empty stomach.      . Coenzyme Q10 (CO Q 10) 100 MG CAPS Take 1 tablet by mouth 2 (two) times daily.      Marland Kitchen donepezil (ARICEPT) 5 MG tablet Take 1 tablet (5 mg  total) by mouth at bedtime.  30 tablet  3  . losartan (COZAAR) 50 MG tablet Take 50 mg by mouth daily.      . metoprolol (LOPRESSOR) 50 MG tablet Take 1 tablet (50 mg total) by mouth 2 (two) times daily.  60 tablet  3  . Multiple Vitamin (MULITIVITAMIN WITH MINERALS) TABS Take 1-2 tablets by mouth 2 (two) times daily. 2 in am, 1 in pm      . simvastatin (ZOCOR) 40 MG tablet Take 40 mg by mouth daily.      . Strontium Chloride CRYS Take 1 tablet by mouth daily.       No current facility-administered medications on file prior to visit.   Past Surgical History  Procedure Laterality Date  . Femur surgery    . Shoulder surgery    . Femur im nail  07/24/2011    Procedure: INTRAMEDULLARY (IM) NAIL FEMORAL;  Surgeon: Javier Docker, MD;  Location: [facility];  Service: Orthopedics;  Laterality: Right;  Biomet, c-arm, Jackson table    Physical exam  BP 120/74  Pulse 97  Temp(Src) 97.4 F (36.3 C) (Oral)  Resp 18  Ht 5' (1.524 m)  Wt 118 lb (53.524 kg)  BMI 23.05 kg/m2  SpO2 96%   Constitutional: She is oriented to person, place, and time. She appears well-developed and well-nourished. No distress.  HENT:   Head: Normocephalic and atraumatic.   Mouth/Throat: Oropharynx is clear and moist. No oropharyngeal exudate.  Has partial plates  Eyes: Conjunctivae  and EOM are normal. Pupils are equal, round, and reactive to light.  Neck: Normal range of motion. Neck supple. No JVD present. No thyromegaly present.  Cardiovascular: Normal rate, regular rhythm and normal heart sounds.   Systolic murmur  Pulmonary/Chest: Effort normal and breath sounds normal. No respiratory distress. She has no wheezes. She has no rales. She exhibits no tenderness.  Abdominal: Soft. Bowel sounds are normal. She exhibits no distension.  Musculoskeletal: Normal range of motion. She exhibits no edema and no tenderness.  Using a cane, has mild scoliosis  Lymphadenopathy:   She has no cervical adenopathy.  Neurological:  She is alert and oriented to person, place, and time. No cranial nerve deficit. Coordination normal.  Skin: Skin is warm and dry. No rash noted. She is not diaphoretic. No erythema. No pallor.  Psychiatric: She has a normal mood and affect. Her behavior is normal. Judgment and thought content normal.    Labs reviewed-  02/26/2011  CBC: wbc 6.1, rbc 4.36, Hemoglobin 13.1 CMP: glucose 105, BUN 15, Creatinine 0.90 A1C: 5.9 Lipid: Cholesterol 187, Triglycerides 146, HDL 53, LDL 105 07/03/2012 CBC; WBC 7.0, RBC 4.38, HGB 13.6 CMP; Glucose 116, BUN 19, Creatinine 0.96 A1c 5.8 Lipid Panel; Cholesterol 181, Triglycerides 139, HDL 59, LDL 94 TSH 3.530 Vit D 56.4  01/01/13 glu 103, bun 19, cr 0.90, na 143, k 4.2, co2 24, ca 9.7, t.chol 217, tg 180, hdl 52, ldl 129, a1c 5.9   ASSESSMENT/PLAN  Osteoporosis- continue fosamax, monitor clinically. Fall precautions and to use her cane  Hyperlipidemia- continue zocor and coenzyme q10  Mixed Dementia- with MMSE 26/30, tolerating aricept well. bp well controlled. Continue aricept  HTN- with adjustment in medication last visit, bp is better controlled. Continue lopressor and losartan current regimen

## 2013-02-23 DIAGNOSIS — Z23 Encounter for immunization: Secondary | ICD-10-CM

## 2013-04-22 ENCOUNTER — Other Ambulatory Visit: Payer: Self-pay | Admitting: Internal Medicine

## 2013-05-18 ENCOUNTER — Ambulatory Visit: Payer: Medicare Other | Admitting: Internal Medicine

## 2013-06-08 ENCOUNTER — Ambulatory Visit (INDEPENDENT_AMBULATORY_CARE_PROVIDER_SITE_OTHER): Payer: Medicare Other | Admitting: Internal Medicine

## 2013-06-15 ENCOUNTER — Other Ambulatory Visit: Payer: Self-pay | Admitting: *Deleted

## 2013-06-22 ENCOUNTER — Encounter: Payer: Self-pay | Admitting: Internal Medicine

## 2013-06-22 ENCOUNTER — Ambulatory Visit (INDEPENDENT_AMBULATORY_CARE_PROVIDER_SITE_OTHER): Payer: Medicare Other | Admitting: Internal Medicine

## 2013-06-22 VITALS — BP 200/90 | HR 64 | Temp 97.7°F | Wt 120.2 lb

## 2013-06-22 DIAGNOSIS — F0150 Vascular dementia without behavioral disturbance: Secondary | ICD-10-CM

## 2013-06-22 DIAGNOSIS — E785 Hyperlipidemia, unspecified: Secondary | ICD-10-CM

## 2013-06-22 DIAGNOSIS — M810 Age-related osteoporosis without current pathological fracture: Secondary | ICD-10-CM

## 2013-06-22 DIAGNOSIS — I10 Essential (primary) hypertension: Secondary | ICD-10-CM

## 2013-06-22 HISTORY — DX: Vascular dementia, unspecified severity, without behavioral disturbance, psychotic disturbance, mood disturbance, and anxiety: F01.50

## 2013-06-22 MED ORDER — AMLODIPINE BESYLATE 5 MG PO TABS
5.0000 mg | ORAL_TABLET | Freq: Every day | ORAL | Status: DC
Start: 2013-06-22 — End: 2013-07-06

## 2013-06-22 NOTE — Progress Notes (Signed)
Patient ID: Beverly Barnett, female   DOB: [birth year], 80 y.o.   MRN: [id number]     . Chief Complaint  Patient presents with  . Medical Managment of Chronic Issues    3 month f/u HTN/memory  . other    wants an RX for a cream for her skin   . other    pt hasn't been taking her Fosamax due to something she read that says she shouldn't take it due to side effects   No Known Allergies  HPI 80y/o female patient is here today with her son. Her blood pressure is elevated this visit. She has been compliant with her meds at home. She has stopped taking fosamax though after reading about its side effect profile but does not recall which one in particular made her stop taking it. No new behavioral changes. No new memory decline noted by son.   Review of Systems   Constitutional: Negative for fever, chills, weight loss and malaise/fatigue.   HENT: Negative for congestion.    Eyes: Negative for blurred vision.   Respiratory: Negative for cough and shortness of breath.    Cardiovascular: Negative for chest pain, palpitations and leg swelling.   Gastrointestinal: Negative for heartburn, nausea, vomiting and abdominal pain.   Genitourinary: Negative for dysuria.   Musculoskeletal: Negative for myalgias.   Skin: Negative for rash.   Neurological: Negative for dizziness, seizures, loss of consciousness and headaches.   Psychiatric/Behavioral: Positive for memory loss but able to carry out a pleasant conversation. Negative for depression. The patient does not have insomnia.      Past Medical History  Diagnosis Date  . Hypertension   . High cholesterol   . Osteoporosis   . Type II or unspecified type diabetes mellitus without mention of complication, not stated as uncontrolled   . Hyperosmolality and/or hypernatremia   . Dysphagia, unspecified(787.20)   . Other atopic dermatitis and related conditions   . Encounter for long-term (current) use of other medications    Medication reviewed. See  [facility]  Physical exam BP 200/90  Pulse 64  Temp(Src) 97.7 F (36.5 C) (Oral)  Wt 120 lb 3.2 oz (54.522 kg)  SpO2 98%  Constitutional: She is oriented to person, place, and time. She appears well-developed and well-nourished. No distress.   HENT:   Head: Normocephalic and atraumatic.   Mouth/Throat: Oropharynx is clear and moist. No oropharyngeal exudate. Has partial plates   Eyes: Conjunctivae and EOM are normal. Pupils are equal, round, and reactive to light.   Neck: Normal range of motion. Neck supple. No JVD present. No thyromegaly present.   Cardiovascular: Normal rate, regular rhythm and normal heart sounds.  Systolic murmur   Pulmonary/Chest: Effort normal and breath sounds normal. No respiratory distress. She has no wheezes. She has no rales. She exhibits no tenderness.   Abdominal: Soft. Bowel sounds are normal. She exhibits no distension.  Musculoskeletal: Normal range of motion. She exhibits no edema and no tenderness. Using a cane, has mild scoliosis  Lymphadenopathy:  She has no cervical adenopathy.  Neurological: She is alert and oriented to person, place, and time. No cranial nerve deficit. Coordination normal.   Skin: Skin is warm and dry. No rash noted. She is not diaphoretic. No erythema. No pallor.  Psychiatric: She has a normal mood and affect. Her behavior is normal. Judgment and thought content normal.   Labs CBC    Component Value Date/Time   WBC 10.1 07/29/2011 0447   RBC 3.28* 07/29/2011  0447   HGB 9.4* 07/29/2011 0447   HCT 28.5* 07/29/2011 0447   PLT 213 07/29/2011 0447   MCV 86.9 07/29/2011 0447   MCH 28.7 07/29/2011 0447   MCHC 33.0 07/29/2011 0447   RDW 16.8* 07/29/2011 0447   LYMPHSABS 1.3 07/23/2011 1656   MONOABS 0.9 07/23/2011 1656   EOSABS 0.0 07/23/2011 1656   BASOSABS 0.0 07/23/2011 1656    CMP     Component Value Date/Time   NA 143 01/01/2013 0845   NA 139 07/29/2011 0447   K 4.2 01/01/2013 0845   CL 104 01/01/2013 0845   CO2 24 01/01/2013 0845    GLUCOSE 103* 01/01/2013 0845   GLUCOSE 114* 07/29/2011 0447   BUN 19 01/01/2013 0845   BUN 19 07/29/2011 0447   CREATININE 0.90 01/01/2013 0845   CALCIUM 9.7 01/01/2013 0845   PROT 7.3 07/23/2011 1656   ALBUMIN 3.8 07/23/2011 1656   AST 27 07/23/2011 1656   ALT 17 07/23/2011 1656   ALKPHOS 60 07/23/2011 1656   BILITOT 0.5 07/23/2011 1656   GFRNONAA 58* 01/01/2013 0845   GFRAA 67 01/01/2013 0845   Assessment/plan  1. Essential hypertension, benign Elevated bp in offcie. Continue losartan and lopressor. Will also start amlodipine 5 mg daily for now. Check bp twice a day and reassess in 2 weeks. Explained about warning signs with elevated bp like headache, chets pain or SOB or nausea requiring quick attention. Pt and son voice understanding this - Basic Metabolic Panel  2. Osteoporosis, unspecified Has stopped fosamax. Due for dexa scan. Will get one scheduled, review the finding and assess for need of treatment and choices. Encouraged to take ca-vitd - DG Bone Density; Future  3. Vascular dementia To have bp under control. Continue aricept for now  4. Hyperlipidemia LDL goal < 130 Continue zocor and co-enzyme q

## 2013-06-22 NOTE — Patient Instructions (Signed)
Check your blood pressure twice a day and bring reading to the office next visit

## 2013-06-22 NOTE — Progress Notes (Signed)
Failed clock drawing  

## 2013-06-23 LAB — BASIC METABOLIC PANEL
BUN / CREAT RATIO: 20 (ref 11–26)
BUN: 18 mg/dL (ref 8–27)
CHLORIDE: 101 mmol/L (ref 97–108)
CO2: 24 mmol/L (ref 18–29)
Calcium: 10 mg/dL (ref 8.6–10.2)
Creatinine, Ser: 0.88 mg/dL (ref 0.57–1.00)
GFR calc Af Amer: 68 mL/min/{1.73_m2} (ref >59)
GFR calc non Af Amer: 59 mL/min/{1.73_m2} — ABNORMAL LOW (ref >59)
Glucose: 94 mg/dL (ref 65–99)
POTASSIUM: 4.7 mmol/L (ref 3.5–5.2)
Sodium: 142 mmol/L (ref 134–144)

## 2013-07-05 ENCOUNTER — Encounter: Payer: Self-pay | Admitting: *Deleted

## 2013-07-06 ENCOUNTER — Encounter: Payer: Self-pay | Admitting: Internal Medicine

## 2013-07-06 ENCOUNTER — Ambulatory Visit (INDEPENDENT_AMBULATORY_CARE_PROVIDER_SITE_OTHER): Payer: Medicare Other | Admitting: Internal Medicine

## 2013-07-06 VITALS — BP 160/78 | HR 64 | Temp 98.0°F | Wt 119.8 lb

## 2013-07-06 DIAGNOSIS — I10 Essential (primary) hypertension: Secondary | ICD-10-CM

## 2013-07-06 MED ORDER — AMLODIPINE BESYLATE 10 MG PO TABS: 10.0000 mg | ORAL_TABLET | Freq: Every day | ORAL | Status: DC

## 2013-07-06 NOTE — Progress Notes (Signed)
Patient ID: Beverly Barnett, female   DOB: 06/20/1923, 80 y.o.   MRN: [id number]    Chief Complaint  Patient presents with  . Medical Managment of Chronic Issues    2 week f/u HTN, BP averaging 149/66 - 169/66   No Known Allergies  HPI 80y/o female patient is here for follow up. Her bp has been elevated at home. It is better than prior readings but still SBP has been high. Not taking her fosamax  Review of Systems   Constitutional: Negative for fever, chills, weight loss and malaise/fatigue.   HENT: Negative for congestion.    Eyes: Negative for blurred vision.   Respiratory: Negative for cough and shortness of breath.    Cardiovascular: Negative for chest pain, palpitations and leg swelling.   Gastrointestinal: Negative for heartburn, nausea, vomiting and abdominal pain.   Genitourinary: Negative for dysuria.   Musculoskeletal: Negative for myalgias.   Skin: Negative for rash.   Neurological: Negative for dizziness, seizures, loss of consciousness and headaches.   Psychiatric/Behavioral: Positive for memory loss but able to carry out a pleasant conversation. Negative for depression. The patient does not have insomnia.     Past Medical History  Diagnosis Date  . Hypertension   . High cholesterol   . Osteoporosis   . Type II or unspecified type diabetes mellitus without mention of complication, not stated as uncontrolled   . Hyperosmolality and/or hypernatremia   . Dysphagia, unspecified(787.20)   . Other atopic dermatitis and related conditions   . Encounter for long-term (current) use of other medications   . Cardiac catheterisation as the cause of abnormal reaction of patient, or of later complication 10/05/2006   Medication reviewed. [facility]  Physical exam BP 160/78  Pulse 64  Temp(Src) 98 F (36.7 C) (Oral)  Wt 119 lb 12.8 oz (54.341 kg)  SpO2 98%  Constitutional: She is oriented to person, place, and time. She appears well-developed and well-nourished. No distress.     HENT:   Head: Normocephalic and atraumatic.   Mouth/Throat: Oropharynx is clear and moist. No oropharyngeal exudate. Has partial plates   Eyes: Conjunctivae and EOM are normal. Pupils are equal, round, and reactive to light.   Neck: Normal range of motion. Neck supple. No JVD present. No thyromegaly present.   Cardiovascular: Normal rate, regular rhythm and normal heart sounds.  Systolic murmur   Pulmonary/Chest: Effort normal and breath sounds normal. No respiratory distress. She has no wheezes. She has no rales. She exhibits no tenderness.   Abdominal: Soft. Bowel sounds are normal. She exhibits no distension.  Musculoskeletal: Normal range of motion. She exhibits no edema and no tenderness. Using a cane, has mild scoliosis  Psychiatric: She has a normal mood and affect. Her behavior is normal. Anxious  Labs- Lab Results  Component Value Date   CREATININE 0.88 06/22/2013   Assessment/plan  1. Essential hypertension, benign Will increase amlodipine to 10 mg daily . Continue losartan and lopressor. Continue bp check at home. Reassess next visit. Warning signs with elevated bp explained Cut down salt intake Continue exercise

## 2013-07-20 ENCOUNTER — Encounter: Payer: Self-pay | Admitting: Internal Medicine

## 2013-07-20 ENCOUNTER — Ambulatory Visit (INDEPENDENT_AMBULATORY_CARE_PROVIDER_SITE_OTHER): Payer: Medicare Other | Admitting: Internal Medicine

## 2013-07-20 VITALS — BP 140/68 | HR 69 | Resp 10 | Wt 119.0 lb

## 2013-07-20 DIAGNOSIS — E785 Hyperlipidemia, unspecified: Secondary | ICD-10-CM

## 2013-07-20 DIAGNOSIS — I10 Essential (primary) hypertension: Secondary | ICD-10-CM

## 2013-07-20 MED ORDER — LOSARTAN POTASSIUM 50 MG PO TABS: ORAL_TABLET | ORAL | Status: DC

## 2013-07-20 MED ORDER — TETANUS-DIPHTH-ACELL PERTUSSIS 5-2.5-18.5 LF-MCG/0.5 IM SUSP: 0.5000 mL | Freq: Once | INTRAMUSCULAR | Status: DC

## 2013-07-20 MED ORDER — DONEPEZIL HCL 5 MG PO TABS: 5.0000 mg | ORAL_TABLET | Freq: Every day | ORAL | Status: DC

## 2013-07-20 MED ORDER — METOPROLOL TARTRATE 50 MG PO TABS: 50.0000 mg | ORAL_TABLET | Freq: Two times a day (BID) | ORAL | Status: DC

## 2013-07-20 NOTE — Progress Notes (Signed)
Patient ID: Beverly Barnett, female   DOB: 06/08/1924, 80 y.o.   MRN: [id number]     Chief complaint- follow up for hypertension  HPI 80 y/o female pt is here for follow up visit. Her bp had been running high and her amlodipine was increased to 10 mg daily last visit. Reviewed home bp reading- better controlled. Few DBP on lower side of normal. No symptoms currently. No other complaints this visit. compliant with her medication. Needs refill  Review of Systems   Constitutional: Negative for fever, chills, weight loss and malaise/fatigue.   HENT: Negative for congestion.    Eyes: Negative for blurred vision.   Respiratory: Negative for cough and shortness of breath.    Cardiovascular: Negative for chest pain, palpitations and leg swelling.   Gastrointestinal: Negative for heartburn, nausea, vomiting and abdominal pain.   Genitourinary: Negative for dysuria.   Musculoskeletal: Negative for myalgias.   Skin: Negative for rash.   Neurological: Negative for dizziness, seizures, loss of consciousness and headaches.   Psychiatric/Behavioral: mild cognitive impairment. Negative for depression. The patient does not have insomnia.    Past Medical History  Diagnosis Date  . Hypertension   . High cholesterol   . Osteoporosis   . Type II or unspecified type diabetes mellitus without mention of complication, not stated as uncontrolled   . Hyperosmolality and/or hypernatremia   . Dysphagia, unspecified(787.20)   . Other atopic dermatitis and related conditions   . Encounter for long-term (current) use of other medications   . Cardiac catheterisation as the cause of abnormal reaction of patient, or of later complication 10/05/2006   Past Surgical History  Procedure Laterality Date  . Femur surgery Right 03/2010  . Shoulder surgery Left 03/2010  . Femur im nail  07/24/2011    Procedure: INTRAMEDULLARY (IM) NAIL FEMORAL;  Surgeon: Javier Docker, MD;  Location: [facility];  Service: Orthopedics;   Laterality: Right;  Biomet, c-arm, Jean Rosenthal table   Current Outpatient Prescriptions on File Prior to Visit  Medication Sig Dispense Refill  . amLODipine (NORVASC) 10 MG tablet Take 1 tablet (10 mg total) by mouth daily.  30 tablet  3  . Coenzyme Q10 (CO Q 10) 100 MG CAPS Take 1 tablet by mouth 2 (two) times daily.      . Multiple Vitamin (MULITIVITAMIN WITH MINERALS) TABS Take 1-2 tablets by mouth 2 (two) times daily. 2 in am, 1 in pm      . simvastatin (ZOCOR) 40 MG tablet Take 40 mg by mouth daily.      . Strontium Chloride CRYS Take 1 tablet by mouth daily.       No current facility-administered medications on file prior to visit.    Physical exam BP 140/68  Pulse 69  Resp 10  Wt 119 lb (53.978 kg)  SpO2 99%  Constitutional: She is oriented to person, place, and time. She appears well-developed and well-nourished. No distress.   HENT:   Head: Normocephalic and atraumatic.   Mouth/Throat: Oropharynx is clear and moist. No oropharyngeal exudate. Has partial plates   Neck: Normal range of motion. Neck supple. No JVD present. No thyromegaly present.   Cardiovascular: Normal rate, regular rhythm and normal heart sounds.  Systolic murmur   Pulmonary/Chest: Effort normal and breath sounds normal. No respiratory distress. She has no wheezes. She has no rales. She exhibits no tenderness.   Abdominal: Soft. Bowel sounds are normal. She exhibits no distension.  Musculoskeletal: Normal range of motion. She exhibits no  edema and no tenderness. Using a cane, has mild scoliosis  Psychiatric: She has a normal mood and affect. Her behavior is normal. Anxious  Labs- CMP     Component Value Date/Time   NA 142 06/22/2013 1259   NA 139 07/29/2011 0447   K 4.7 06/22/2013 1259   CL 101 06/22/2013 1259   CO2 24 06/22/2013 1259   GLUCOSE 94 06/22/2013 1259   GLUCOSE 114* 07/29/2011 0447   BUN 18 06/22/2013 1259   BUN 19 07/29/2011 0447   CREATININE 0.88 06/22/2013 1259   CALCIUM 10.0 06/22/2013 1259    PROT 7.3 07/23/2011 1656   ALBUMIN 3.8 07/23/2011 1656   AST 27 07/23/2011 1656   ALT 17 07/23/2011 1656   ALKPHOS 60 07/23/2011 1656   BILITOT 0.5 07/23/2011 1656   GFRNONAA 59* 06/22/2013 1259   GFRAA 68 06/22/2013 1259   Assessment/plan  1. Hypertension Continue current regimen of lopressor 50 mg bid with losartan 50 mg daily and amlodipine 10 mg daily. Monitor bp. Warning signs with elevated and low bp reviewed. To monitor bp reading at home - metoprolol (LOPRESSOR) 50 MG tablet; Take 1 tablet (50 mg total) by mouth 2 (two) times daily.  Dispense: 180 tablet; Refill: 1  2. Hyperlipidemia LDL goal < 130 Continue zocor for now

## 2013-07-29 ENCOUNTER — Inpatient Hospital Stay (HOSPITAL_COMMUNITY): Payer: Medicare Other

## 2013-07-29 ENCOUNTER — Inpatient Hospital Stay (HOSPITAL_COMMUNITY)
Admission: AD | Admit: 2013-07-29 | Discharge: 2013-08-03 | DRG: 481 | Disposition: A | Discharge status: Skilled Nursing Facility | Payer: Medicare Other | Source: Ambulatory Visit | Attending: Internal Medicine | Admitting: Internal Medicine

## 2013-07-29 DIAGNOSIS — Y92009 Unspecified place in unspecified non-institutional (private) residence as the place of occurrence of the external cause: Secondary | ICD-10-CM

## 2013-07-29 DIAGNOSIS — E119 Type 2 diabetes mellitus without complications: Secondary | ICD-10-CM | POA: Diagnosis present

## 2013-07-29 DIAGNOSIS — D649 Anemia, unspecified: Secondary | ICD-10-CM | POA: Diagnosis present

## 2013-07-29 DIAGNOSIS — I672 Cerebral atherosclerosis: Secondary | ICD-10-CM | POA: Diagnosis present

## 2013-07-29 DIAGNOSIS — D62 Acute posthemorrhagic anemia: Secondary | ICD-10-CM | POA: Diagnosis not present

## 2013-07-29 DIAGNOSIS — D72829 Elevated white blood cell count, unspecified: Secondary | ICD-10-CM | POA: Diagnosis present

## 2013-07-29 DIAGNOSIS — R011 Cardiac murmur, unspecified: Secondary | ICD-10-CM | POA: Diagnosis present

## 2013-07-29 DIAGNOSIS — Z79899 Other long term (current) drug therapy: Secondary | ICD-10-CM

## 2013-07-29 DIAGNOSIS — K5900 Constipation, unspecified: Secondary | ICD-10-CM | POA: Diagnosis not present

## 2013-07-29 DIAGNOSIS — D638 Anemia in other chronic diseases classified elsewhere: Secondary | ICD-10-CM | POA: Diagnosis present

## 2013-07-29 DIAGNOSIS — M810 Age-related osteoporosis without current pathological fracture: Secondary | ICD-10-CM | POA: Diagnosis present

## 2013-07-29 DIAGNOSIS — E7800 Pure hypercholesterolemia, unspecified: Secondary | ICD-10-CM | POA: Diagnosis present

## 2013-07-29 DIAGNOSIS — F0150 Vascular dementia without behavioral disturbance: Secondary | ICD-10-CM | POA: Diagnosis present

## 2013-07-29 DIAGNOSIS — S72142A Displaced intertrochanteric fracture of left femur, initial encounter for closed fracture: Secondary | ICD-10-CM | POA: Diagnosis present

## 2013-07-29 DIAGNOSIS — S72002A Fracture of unspecified part of neck of left femur, initial encounter for closed fracture: Secondary | ICD-10-CM | POA: Diagnosis present

## 2013-07-29 DIAGNOSIS — I10 Essential (primary) hypertension: Secondary | ICD-10-CM | POA: Diagnosis present

## 2013-07-29 DIAGNOSIS — S72143A Displaced intertrochanteric fracture of unspecified femur, initial encounter for closed fracture: Principal | ICD-10-CM | POA: Diagnosis present

## 2013-07-29 DIAGNOSIS — R296 Repeated falls: Secondary | ICD-10-CM | POA: Diagnosis present

## 2013-07-29 DIAGNOSIS — R404 Transient alteration of awareness: Secondary | ICD-10-CM | POA: Diagnosis not present

## 2013-07-29 DIAGNOSIS — I059 Rheumatic mitral valve disease, unspecified: Secondary | ICD-10-CM | POA: Diagnosis present

## 2013-07-29 DIAGNOSIS — F0390 Unspecified dementia without behavioral disturbance: Secondary | ICD-10-CM

## 2013-07-29 LAB — BASIC METABOLIC PANEL
BUN: 19 mg/dL (ref 6–23)
CALCIUM: 9.4 mg/dL (ref 8.4–10.5)
CHLORIDE: 101 meq/L (ref 96–112)
CO2: 25 mEq/L (ref 19–32)
CREATININE: 0.8 mg/dL (ref 0.50–1.10)
GFR calc non Af Amer: 64 mL/min — ABNORMAL LOW (ref >90)
GFR, EST AFRICAN AMERICAN: 75 mL/min — AB (ref >90)
Glucose, Bld: 131 mg/dL — ABNORMAL HIGH (ref 70–99)
Potassium: 4.1 mEq/L (ref 3.7–5.3)
SODIUM: 139 meq/L (ref 137–147)

## 2013-07-29 LAB — CBC
HEMATOCRIT: 35.7 % — AB (ref 36.0–46.0)
Hemoglobin: 11.7 g/dL — ABNORMAL LOW (ref 12.0–15.0)
MCH: 30.1 pg (ref 26.0–34.0)
MCHC: 32.8 g/dL (ref 30.0–36.0)
MCV: 91.8 fL (ref 78.0–100.0)
Platelets: 198 10*3/uL (ref 150–400)
RBC: 3.89 MIL/uL (ref 3.87–5.11)
RDW: 13.2 % (ref 11.5–15.5)
WBC: 13.3 10*3/uL — ABNORMAL HIGH (ref 4.0–10.5)

## 2013-07-29 LAB — PROTIME-INR
INR: 1.06 (ref 0.00–1.49)
Prothrombin Time: 13.6 seconds (ref 11.6–15.2)

## 2013-07-29 LAB — GLUCOSE, CAPILLARY: Glucose-Capillary: 121 mg/dL — ABNORMAL HIGH (ref 70–99)

## 2013-07-29 MED ORDER — CO Q 10 100 MG PO CAPS: 1.0000 | ORAL_CAPSULE | Freq: Two times a day (BID) | ORAL | Status: DC

## 2013-07-29 MED ORDER — AMLODIPINE BESYLATE 10 MG PO TABS
10.0000 mg | ORAL_TABLET | Freq: Every day | ORAL | Status: DC
  Administered 2013-07-29: 10 mg via ORAL
  Filled 2013-07-29 (×2): qty 1

## 2013-07-29 MED ORDER — LOSARTAN POTASSIUM 50 MG PO TABS
50.0000 mg | ORAL_TABLET | Freq: Every day | ORAL | Status: DC
Start: 2013-07-31 — End: 2013-07-30

## 2013-07-29 MED ORDER — ADULT MULTIVITAMIN W/MINERALS CH
1.0000 | ORAL_TABLET | Freq: Two times a day (BID) | ORAL | Status: DC
  Administered 2013-07-29 – 2013-08-03 (×7): 1 via ORAL
  Filled 2013-07-29 (×13): qty 1

## 2013-07-29 MED ORDER — INSULIN ASPART 100 UNIT/ML ~~LOC~~ SOLN: 0.0000 [IU] | Freq: Every day | SUBCUTANEOUS | Status: DC

## 2013-07-29 MED ORDER — METOPROLOL TARTRATE 50 MG PO TABS
50.0000 mg | ORAL_TABLET | Freq: Two times a day (BID) | ORAL | Status: DC
  Administered 2013-07-29 – 2013-08-03 (×10): 50 mg via ORAL
  Filled 2013-07-29 (×11): qty 1

## 2013-07-29 MED ORDER — ZOLPIDEM TARTRATE 5 MG PO TABS: 5.0000 mg | ORAL_TABLET | Freq: Every evening | ORAL | Status: DC | PRN

## 2013-07-29 MED ORDER — BISACODYL 5 MG PO TBEC: 5.0000 mg | DELAYED_RELEASE_TABLET | Freq: Every day | ORAL | Status: DC | PRN

## 2013-07-29 MED ORDER — SODIUM CHLORIDE 0.9 % IV SOLN
INTRAVENOUS | Status: DC
  Administered 2013-07-29: 50 mL via INTRAVENOUS
  Administered 2013-07-30: 20:00:00 via INTRAVENOUS

## 2013-07-29 MED ORDER — SIMVASTATIN 20 MG PO TABS
20.0000 mg | ORAL_TABLET | Freq: Every day | ORAL | Status: DC
  Administered 2013-07-29 – 2013-08-02 (×4): 20 mg via ORAL
  Filled 2013-07-29 (×6): qty 1

## 2013-07-29 MED ORDER — MORPHINE SULFATE 2 MG/ML IJ SOLN
0.5000 mg | INTRAMUSCULAR | Status: DC | PRN
  Administered 2013-07-30 (×2): 0.5 mg via INTRAVENOUS
  Filled 2013-07-29 (×2): qty 1

## 2013-07-29 MED ORDER — DONEPEZIL HCL 5 MG PO TABS
5.0000 mg | ORAL_TABLET | Freq: Every day | ORAL | Status: DC
  Administered 2013-07-29 – 2013-08-02 (×5): 5 mg via ORAL
  Filled 2013-07-29 (×6): qty 1

## 2013-07-29 MED ORDER — MAGNESIUM CITRATE PO SOLN: 1.0000 | Freq: Once | ORAL | Status: AC | PRN

## 2013-07-29 MED ORDER — HYDROCODONE-ACETAMINOPHEN 5-325 MG PO TABS
1.0000 | ORAL_TABLET | Freq: Four times a day (QID) | ORAL | Status: DC | PRN
  Administered 2013-07-29 (×2): 1 via ORAL
  Filled 2013-07-29 (×2): qty 1

## 2013-07-29 MED ORDER — OXYCODONE HCL 5 MG PO TABS
5.0000 mg | ORAL_TABLET | ORAL | Status: DC | PRN
  Administered 2013-07-30: 5 mg via ORAL
  Filled 2013-07-29: qty 1

## 2013-07-29 MED ORDER — CEFAZOLIN SODIUM-DEXTROSE 2-3 GM-% IV SOLR
2.0000 g | INTRAVENOUS | Status: AC
  Administered 2013-07-30: 2 g via INTRAVENOUS
  Filled 2013-07-29: qty 50

## 2013-07-29 MED ORDER — SODIUM CHLORIDE 0.45 % IV SOLN: INTRAVENOUS | Status: DC

## 2013-07-29 MED ORDER — DOCUSATE SODIUM 100 MG PO CAPS
100.0000 mg | ORAL_CAPSULE | Freq: Two times a day (BID) | ORAL | Status: DC
  Administered 2013-07-29 – 2013-08-03 (×9): 100 mg via ORAL

## 2013-07-29 MED ORDER — SENNOSIDES-DOCUSATE SODIUM 8.6-50 MG PO TABS: 1.0000 | ORAL_TABLET | Freq: Every evening | ORAL | Status: DC | PRN

## 2013-07-29 MED ORDER — INSULIN ASPART 100 UNIT/ML ~~LOC~~ SOLN
0.0000 [IU] | Freq: Three times a day (TID) | SUBCUTANEOUS | Status: DC
  Administered 2013-07-31 – 2013-08-02 (×5): 1 [IU] via SUBCUTANEOUS

## 2013-07-29 NOTE — H&P (Signed)
[facility] History and Physical  Beverly Barnett DJM:[id number] DOB: Nov 02, 1923 DOA: 07/29/2013  Referring physician: EDP PCP: Beverly Serve, MD   Chief Complaint: Left hip pain status post fall  HPI: Beverly Barnett is a 80 y.o. female with history of hypertension osteoporosis, vascular dementia who presents with above complaints. She states that while in the kitchen today she sustained a mechanical fall and began having pain in her left hip. She reports that she had had a right hip fracture or years ago and had surgery done by Dr. Maxie Better and so he went to the office and x-rays today showed a comminuted left femur intertrochanteric fracture. She was directly admitted to the hospital/TRH service for further evaluation and management. She denies dizziness, chest pain, shortness of breath, cough, syncope, melena and no hematochezia.    Review of Systems The patient denies anorexia, fever, weight loss, vision loss, decreased hearing, hoarseness, chest pain, syncope, dyspnea on exertion, peripheral edema,, hemoptysis, abdominal pain, melena, hematochezia, severe indigestion/heartburn, hematuria, incontinence,, muscle weakness, suspicious skin lesions, transient blindness, difficulty walking, depression, unusual weight    Past Medical History  Diagnosis Date  . Hypertension   . High cholesterol   . Osteoporosis   . Type II or unspecified type diabetes mellitus without mention of complication, not stated as uncontrolled   . Hyperosmolality and/or hypernatremia   . Dysphagia, unspecified(787.20)   . Other atopic dermatitis and related conditions   . Encounter for long-term (current) use of other medications   . Cardiac catheterisation as the cause of abnormal reaction of patient, or of later complication 22/29/7989   Past Surgical History  Procedure Laterality Date  . Femur surgery Right 03/2010  . Shoulder surgery Left 03/2010  . Femur im nail  07/24/2011    Procedure: INTRAMEDULLARY (IM)  NAIL FEMORAL;  Surgeon: Johnn Hai, MD;  Location: [facility];  Service: Orthopedics;  Laterality: Right;  Biomet, c-arm, Jackson table   Social History:  reports that she has never smoked. She does not have any smokeless tobacco history on file. She reports that she does not drink alcohol or use illicit drugs.  No Known Allergies  Family History  Problem Relation Age of Onset  . Cancer Sister   . Heart disease Father      Prior to Admission medications   Medication Sig Start Date End Date Taking? Authorizing Provider  amLODipine (NORVASC) 10 MG tablet Take 1 tablet (10 mg total) by mouth daily. 07/06/13   Beverly Serve, MD  Coenzyme Q10 (CO Q 10) 100 MG CAPS Take 1 tablet by mouth 2 (two) times daily.    Historical Provider, MD  donepezil (ARICEPT) 5 MG tablet Take 1 tablet (5 mg total) by mouth at bedtime. 07/20/13   Beverly Serve, MD  losartan (COZAAR) 50 MG tablet TAKE ONE TABLET BY MOUTH EVERY DAY FOR BLOOD PRESSURE 07/20/13   Beverly Serve, MD  metoprolol (LOPRESSOR) 50 MG tablet Take 1 tablet (50 mg total) by mouth 2 (two) times daily. 07/20/13   Beverly Serve, MD  Multiple Vitamin (MULITIVITAMIN WITH MINERALS) TABS Take 1-2 tablets by mouth 2 (two) times daily. 2 in am, 1 in pm    Historical Provider, MD  simvastatin (ZOCOR) 40 MG tablet Take 40 mg by mouth daily.    Historical Provider, MD  Strontium Chloride CRYS Take 1 tablet by mouth daily.    Historical Provider, MD   Physical Exam: Filed Vitals:   07/29/13 1730  BP: 186/73  Pulse: 66  Temp: 97.8 F (36.6 C)    BP 186/73  Pulse 66  Temp(Src) 97.8 F (36.6 C) (Oral)  Ht 5' (1.524 m)  Wt 53.978 kg (119 lb)  BMI 23.24 kg/m2  SpO2 99% Constitutional: Vital signs reviewed.  Patient is a well-developed and well-nourished in no acute distress and cooperative with exam. Alert and oriented x3.  Head: Normocephalic and atraumatic Mouth: no erythema or exudates, MMM Eyes: PERRL, EOMI, conjunctivae normal, No scleral icterus.   Neck: Supple, Trachea midline normal ROM, No JVD, mass, thyromegaly, or carotid bruit present.  Cardiovascular: RRR, S1 normal, S2 normal, no MRG, pulses symmetric and intact bilaterally Pulmonary/Chest: normal respiratory effort, CTAB, no wheezes, rales, or rhonchi Abdominal: Soft. Non-tender, non-distended, bowel sounds are normal, no masses, organomegaly, or guarding present.  GU: no CVA tenderness  extremities: No cyanosis and no edema  Neurological: A&O x3, Strength is normal and symmetric bilaterally, cranial nerve II-XII are grossly intact, no focal motor deficit, sensory intact to light touch bilaterally.  Skin: Warm, dry and intact. No rash.  Psychiatric: Normal mood and affect. speech and behavior is normal.                 Labs on Admission:  Basic Metabolic Panel: No results found for this basename: NA, K, CL, CO2, GLUCOSE, BUN, CREATININE, CALCIUM, MG, PHOS,  in the last 168 hours Liver Function Tests: No results found for this basename: AST, ALT, ALKPHOS, BILITOT, PROT, ALBUMIN,  in the last 168 hours No results found for this basename: LIPASE, AMYLASE,  in the last 168 hours No results found for this basename: AMMONIA,  in the last 168 hours CBC:  Recent Labs Lab 07/29/13 1905  WBC 13.3*  HGB 11.7*  HCT 35.7*  MCV 91.8  PLT 198   Cardiac Enzymes: No results found for this basename: CKTOTAL, CKMB, CKMBINDEX, TROPONINI,  in the last 168 hours  BNP (last 3 results) No results found for this basename: PROBNP,  in the last 8760 hours CBG: No results found for this basename: GLUCAP,  in the last 168 hours  Radiological Exams on Admission: Dg Chest Portable 1 View  07/29/2013   CLINICAL DATA:  80 year old female preoperative study for left hip ORIF. Initial encounter.  EXAM: PORTABLE CHEST - 1 VIEW  COMPARISON:  07/23/2011.  FINDINGS: Portable AP semi upright view at 1854 hr. Mildly lower lung volumes. Normal cardiac size and mediastinal contours. Visualized  tracheal air column is within normal limits. Allowing for portable technique, the lungs are clear. Chronic proximal left humerus ORIF hardware.  IMPRESSION: No acute cardiopulmonary abnormality.   Electronically Signed   By: Lars Pinks M.D.   On: 07/29/2013 19:15   Dg Hip Portable 1 View Left  07/29/2013   CLINICAL DATA:  80 year old female preoperative study for left hip fixation. Initial encounter.  EXAM: PORTABLE LEFT HIP - 1 VIEW  COMPARISON:  None.  FINDINGS: Portable AP view at 1857 hr demonstrating comminuted intertrochanteric fracture. Distal fragment minimally impacted. Lesser trochanter fragment mildly retracted. There appears to be a longitudinal component of the fracture extending into the left femoral neck. Left femoral head appears to remain normally located. Visible left hemipelvis intact. Calcified atherosclerosis.  IMPRESSION: Comminuted left femur intertrochanteric fracture.   Electronically Signed   By: Lars Pinks M.D.   On: 07/29/2013 19:14      Assessment/Plan Active Problems:   Intertrochanteric fracture of left hip -As discussed above, per orthopedics surgery planned in a.m. -Analgesics for pain management  She is oxygenating well on room air chest x-ray shows no acute findings -Patient is chest pain free, EKG ordered and pending at this time>> rounding hospitalist to follow up and clear  as clinically appropriate pending review of pending studies.   History of normocytic Anemia -Likely chronic disease, hemoglobin stable at 11.7 today, follow recheck Leukocytosis -Chest x-ray with no acute findings, followup on pending urinalysis -Possibly reactive, follow and recheck in a.m.   Hypertension -Continue outpatient medications except for ARB for now pending creatinine>> B. met is pending at this time.   Vascular dementia -Continue outpatient medications       Code Status: full Family Communication: none at bedside Disposition Plan:admitted to med surge  Time spent:  >57mns  [facility] Pager [phone]

## 2013-07-29 NOTE — Consult Note (Signed)
Reason for Consult:left hip fx Referring Physician: Dr. York Spaniel- admitting physician  Beverly Barnett is an 80 y.o. female.  HPI: Beverly Barnett fell in her kitchen today on her left hip with immediate pain at approx 2pm. Denies any other injuries. Beverly Barnett did have a prior R hip fx several years ago tx by Dr. Shelle Iron with IM nail. Beverly Barnett was seen as an outpatient today at [facility] by Dr. Penni Bombard, found to have a left hip fx, and decision was made to admit for definitive tx. No head injury or LOC  Past Medical History  Diagnosis Date  . Hypertension   . High cholesterol   . Osteoporosis   . Type II or unspecified type diabetes mellitus without mention of complication, not stated as uncontrolled   . Hyperosmolality and/or hypernatremia   . Dysphagia, unspecified(787.20)   . Other atopic dermatitis and related conditions   . Encounter for long-term (current) use of other medications   . Cardiac catheterisation as the cause of abnormal reaction of patient, or of later complication 10/05/2006    Past Surgical History  Procedure Laterality Date  . Femur surgery Right 03/2010  . Shoulder surgery Left 03/2010  . Femur im nail  07/24/2011    Procedure: INTRAMEDULLARY (IM) NAIL FEMORAL;  Surgeon: Javier Docker, MD;  Location: [facility];  Service: Orthopedics;  Laterality: Right;  Biomet, c-arm, Jackson table    Family History  Problem Relation Age of Onset  . Cancer Sister   . Heart disease Father     Social History:  reports that Beverly Barnett has never smoked. Beverly Barnett does not have any smokeless tobacco history on file. Beverly Barnett reports that Beverly Barnett does not drink alcohol or use illicit drugs.  Allergies: No Known Allergies  Medications: I have reviewed the patient's current medications.  No results found for this or any previous visit (from the past 48 hour(s)).  No results found.  Review of Systems  Constitutional: Negative.   HENT: Negative.   Eyes: Negative.   Respiratory: Negative.    Cardiovascular: Negative.   Gastrointestinal: Negative.   Genitourinary: Negative.   Musculoskeletal: Positive for falls and joint pain.  Skin: Negative.   Neurological: Negative.   Psychiatric/Behavioral: Negative.    Blood pressure 186/73, pulse 66, temperature 97.8 F (36.6 C), temperature source Oral, height 5' (1.524 m), weight 53.978 kg (119 lb), SpO2 99.00%. Physical Exam  Constitutional: Beverly Barnett is oriented to person, place, and time. Beverly Barnett appears well-developed and well-nourished. Beverly Barnett appears distressed.  HENT:  Head: Normocephalic and atraumatic.  Eyes: Conjunctivae and EOM are normal. Pupils are equal, round, and reactive to light.  Neck: Normal range of motion. Neck supple.  Cardiovascular: Normal rate and regular rhythm.   Respiratory: Effort normal and breath sounds normal.  GI: Soft. Bowel sounds are normal.  Musculoskeletal:  Left leg shortened and externally rotated ttp left hip and groin Pain with ROM left hip  Neurological: Beverly Barnett is alert and oriented to person, place, and time. Beverly Barnett has normal reflexes.  Skin: Skin is warm and dry.   xrays at [facility] today AP and lateral with displaced intertroch fx, displaced lesser troch fragment. There is a question of fx extension into the femoral neck.   Assessment/Plan: Left hip fx (intertrochanteric)  Plan admit to hospitalist service directly from office tonight L hip fx will require IM nail, planned for tomorrow either by Dr. Shelle Iron or Dr. Charlann Boxer depending on schedule/time Dr. Penni Bombard discussed surgery with the pt and her family [facility] Pre-op orders for labs, CXR, EKG placed Will obtain new portable AP xray L hip  Remain NPO after MN tonight for surgery tomorrow  Dorothy SparkBISSELL, JACLYN M. 07/29/2013, 6:41 PM

## 2013-07-30 ENCOUNTER — Encounter (HOSPITAL_COMMUNITY): Payer: Self-pay | Admitting: Orthopedic Surgery

## 2013-07-30 ENCOUNTER — Encounter (HOSPITAL_COMMUNITY)
Admission: AD | Disposition: A | Discharge status: Skilled Nursing Facility | Payer: Self-pay | Source: Ambulatory Visit | Attending: Internal Medicine

## 2013-07-30 ENCOUNTER — Inpatient Hospital Stay (HOSPITAL_COMMUNITY): Payer: Medicare Other

## 2013-07-30 ENCOUNTER — Inpatient Hospital Stay (HOSPITAL_COMMUNITY): Payer: Medicare Other | Admitting: Anesthesiology

## 2013-07-30 ENCOUNTER — Encounter (HOSPITAL_COMMUNITY): Payer: Medicare Other | Admitting: Anesthesiology

## 2013-07-30 DIAGNOSIS — I059 Rheumatic mitral valve disease, unspecified: Secondary | ICD-10-CM

## 2013-07-30 HISTORY — PX: INTRAMEDULLARY (IM) NAIL INTERTROCHANTERIC: SHX5875

## 2013-07-30 LAB — URINALYSIS, ROUTINE W REFLEX MICROSCOPIC
Bilirubin Urine: NEGATIVE
Glucose, UA: NEGATIVE mg/dL
Hgb urine dipstick: NEGATIVE
KETONES UR: NEGATIVE mg/dL
LEUKOCYTES UA: NEGATIVE
NITRITE: NEGATIVE
PH: 6.5 (ref 5.0–8.0)
Protein, ur: NEGATIVE mg/dL
Specific Gravity, Urine: 1.014 (ref 1.005–1.030)
Urobilinogen, UA: 0.2 mg/dL (ref 0.0–1.0)

## 2013-07-30 LAB — GLUCOSE, CAPILLARY
GLUCOSE-CAPILLARY: 113 mg/dL — AB (ref 70–99)
GLUCOSE-CAPILLARY: 130 mg/dL — AB (ref 70–99)
Glucose-Capillary: 121 mg/dL — ABNORMAL HIGH (ref 70–99)
Glucose-Capillary: 102 mg/dL — ABNORMAL HIGH (ref 70–99)

## 2013-07-30 LAB — BASIC METABOLIC PANEL
BUN: 24 mg/dL — ABNORMAL HIGH (ref 6–23)
CALCIUM: 8.7 mg/dL (ref 8.4–10.5)
CO2: 25 mEq/L (ref 19–32)
Chloride: 105 mEq/L (ref 96–112)
Creatinine, Ser: 1.01 mg/dL (ref 0.50–1.10)
GFR calc Af Amer: 56 mL/min — ABNORMAL LOW (ref >90)
GFR, EST NON AFRICAN AMERICAN: 49 mL/min — AB (ref >90)
Glucose, Bld: 127 mg/dL — ABNORMAL HIGH (ref 70–99)
POTASSIUM: 4 meq/L (ref 3.7–5.3)
SODIUM: 141 meq/L (ref 137–147)

## 2013-07-30 LAB — CBC
HCT: 29.5 % — ABNORMAL LOW (ref 36.0–46.0)
Hemoglobin: 9.7 g/dL — ABNORMAL LOW (ref 12.0–15.0)
MCH: 30.2 pg (ref 26.0–34.0)
MCHC: 32.9 g/dL (ref 30.0–36.0)
MCV: 91.9 fL (ref 78.0–100.0)
Platelets: 169 10*3/uL (ref 150–400)
RBC: 3.21 MIL/uL — AB (ref 3.87–5.11)
RDW: 13.3 % (ref 11.5–15.5)
WBC: 9.8 10*3/uL (ref 4.0–10.5)

## 2013-07-30 LAB — URINE MICROSCOPIC-ADD ON

## 2013-07-30 LAB — HEMOGLOBIN A1C
HEMOGLOBIN A1C: 5.6 % (ref <5.7)
Mean Plasma Glucose: 114 mg/dL (ref <117)

## 2013-07-30 LAB — SURGICAL PCR SCREEN
MRSA, PCR: NEGATIVE
STAPHYLOCOCCUS AUREUS: NEGATIVE

## 2013-07-30 SURGERY — FIXATION, FRACTURE, INTERTROCHANTERIC, WITH INTRAMEDULLARY ROD
Anesthesia: General | Laterality: Left

## 2013-07-30 MED ORDER — LACTATED RINGERS IV SOLN
INTRAVENOUS | Status: DC | PRN
  Administered 2013-07-30 (×2): via INTRAVENOUS

## 2013-07-30 MED ORDER — 0.9 % SODIUM CHLORIDE (POUR BTL) OPTIME
TOPICAL | Status: DC | PRN
  Administered 2013-07-30: 1000 mL

## 2013-07-30 MED ORDER — MENTHOL 3 MG MT LOZG: 1.0000 | LOZENGE | OROMUCOSAL | Status: DC | PRN

## 2013-07-30 MED ORDER — PROPOFOL 10 MG/ML IV BOLUS
INTRAVENOUS | Status: AC
  Filled 2013-07-30: qty 20

## 2013-07-30 MED ORDER — EPHEDRINE SULFATE 50 MG/ML IJ SOLN
INTRAMUSCULAR | Status: DC | PRN
  Administered 2013-07-30: 10 mg via INTRAVENOUS

## 2013-07-30 MED ORDER — POLYETHYLENE GLYCOL 3350 17 G PO PACK: 17.0000 g | PACK | Freq: Every day | ORAL | Status: DC | PRN

## 2013-07-30 MED ORDER — PHENOL 1.4 % MT LIQD: 1.0000 | OROMUCOSAL | Status: DC | PRN

## 2013-07-30 MED ORDER — HYDROCODONE-ACETAMINOPHEN 5-325 MG PO TABS: 1.0000 | ORAL_TABLET | Freq: Four times a day (QID) | ORAL | Status: DC | PRN

## 2013-07-30 MED ORDER — GLYCOPYRROLATE 0.2 MG/ML IJ SOLN
INTRAMUSCULAR | Status: DC | PRN
  Administered 2013-07-30: 0.2 mg via INTRAVENOUS
  Administered 2013-07-30: 0.4 mg via INTRAVENOUS
  Administered 2013-07-30: 0.2 mg via INTRAVENOUS

## 2013-07-30 MED ORDER — GLYCOPYRROLATE 0.2 MG/ML IJ SOLN
INTRAMUSCULAR | Status: AC
  Filled 2013-07-30: qty 3

## 2013-07-30 MED ORDER — CEFAZOLIN SODIUM-DEXTROSE 2-3 GM-% IV SOLR
2.0000 g | Freq: Four times a day (QID) | INTRAVENOUS | Status: AC
  Administered 2013-07-31 (×2): 2 g via INTRAVENOUS
  Filled 2013-07-30 (×2): qty 50

## 2013-07-30 MED ORDER — FLEET ENEMA 7-19 GM/118ML RE ENEM: 1.0000 | ENEMA | Freq: Once | RECTAL | Status: AC | PRN

## 2013-07-30 MED ORDER — ASPIRIN EC 325 MG PO TBEC: 325.0000 mg | DELAYED_RELEASE_TABLET | Freq: Two times a day (BID) | ORAL | Status: AC

## 2013-07-30 MED ORDER — ONDANSETRON HCL 4 MG/2ML IJ SOLN: 4.0000 mg | Freq: Four times a day (QID) | INTRAMUSCULAR | Status: DC | PRN

## 2013-07-30 MED ORDER — LIDOCAINE HCL (CARDIAC) 20 MG/ML IV SOLN
INTRAVENOUS | Status: DC | PRN
  Administered 2013-07-30: 50 mg via INTRAVENOUS

## 2013-07-30 MED ORDER — FENTANYL CITRATE 0.05 MG/ML IJ SOLN
INTRAMUSCULAR | Status: AC
  Filled 2013-07-30: qty 2

## 2013-07-30 MED ORDER — METOCLOPRAMIDE HCL 5 MG/ML IJ SOLN: 5.0000 mg | Freq: Three times a day (TID) | INTRAMUSCULAR | Status: DC | PRN

## 2013-07-30 MED ORDER — ASPIRIN EC 325 MG PO TBEC
325.0000 mg | DELAYED_RELEASE_TABLET | Freq: Two times a day (BID) | ORAL | Status: DC
  Administered 2013-07-31 – 2013-08-03 (×7): 325 mg via ORAL
  Filled 2013-07-30 (×9): qty 1

## 2013-07-30 MED ORDER — ROCURONIUM BROMIDE 100 MG/10ML IV SOLN
INTRAVENOUS | Status: AC
  Filled 2013-07-30: qty 1

## 2013-07-30 MED ORDER — CEFAZOLIN SODIUM-DEXTROSE 2-3 GM-% IV SOLR
INTRAVENOUS | Status: AC
  Filled 2013-07-30: qty 50

## 2013-07-30 MED ORDER — ROCURONIUM BROMIDE 100 MG/10ML IV SOLN
INTRAVENOUS | Status: DC | PRN
  Administered 2013-07-30: 20 mg via INTRAVENOUS

## 2013-07-30 MED ORDER — FERROUS SULFATE 325 (65 FE) MG PO TABS
325.0000 mg | ORAL_TABLET | Freq: Three times a day (TID) | ORAL | Status: DC
  Administered 2013-07-31 – 2013-08-03 (×10): 325 mg via ORAL
  Filled 2013-07-30 (×14): qty 1

## 2013-07-30 MED ORDER — HYDROCODONE-ACETAMINOPHEN 7.5-325 MG PO TABS: 1.0000 | ORAL_TABLET | Freq: Four times a day (QID) | ORAL | Status: DC | PRN

## 2013-07-30 MED ORDER — ONDANSETRON HCL 4 MG PO TABS: 4.0000 mg | ORAL_TABLET | Freq: Four times a day (QID) | ORAL | Status: DC | PRN

## 2013-07-30 MED ORDER — FENTANYL CITRATE 0.05 MG/ML IJ SOLN
25.0000 ug | INTRAMUSCULAR | Status: DC | PRN
  Administered 2013-07-30 (×4): 25 ug via INTRAVENOUS
  Administered 2013-07-30: 50 ug via INTRAVENOUS

## 2013-07-30 MED ORDER — FENTANYL CITRATE 0.05 MG/ML IJ SOLN
INTRAMUSCULAR | Status: DC | PRN
  Administered 2013-07-30 (×3): 50 ug via INTRAVENOUS

## 2013-07-30 MED ORDER — LIDOCAINE HCL (CARDIAC) 20 MG/ML IV SOLN
INTRAVENOUS | Status: AC
  Filled 2013-07-30: qty 10

## 2013-07-30 MED ORDER — SUCCINYLCHOLINE CHLORIDE 20 MG/ML IJ SOLN
INTRAMUSCULAR | Status: DC | PRN
  Administered 2013-07-30: 100 mg via INTRAVENOUS

## 2013-07-30 MED ORDER — NEOSTIGMINE METHYLSULFATE 1 MG/ML IJ SOLN
INTRAMUSCULAR | Status: DC | PRN
  Administered 2013-07-30: 3 mg via INTRAVENOUS

## 2013-07-30 MED ORDER — METOCLOPRAMIDE HCL 10 MG PO TABS: 5.0000 mg | ORAL_TABLET | Freq: Three times a day (TID) | ORAL | Status: DC | PRN

## 2013-07-30 MED ORDER — HYDROMORPHONE HCL PF 1 MG/ML IJ SOLN
INTRAMUSCULAR | Status: AC
  Filled 2013-07-30: qty 1

## 2013-07-30 MED ORDER — PROPOFOL 10 MG/ML IV BOLUS
INTRAVENOUS | Status: DC | PRN
  Administered 2013-07-30: 80 mg via INTRAVENOUS

## 2013-07-30 SURGICAL SUPPLY — 43 items
BAG ZIPLOCK 12X15 (MISCELLANEOUS) ×3 IMPLANT
BIT DRILL CANN LG 4.3MM (BIT) ×1 IMPLANT
BNDG COHESIVE 6X5 TAN STRL LF (GAUZE/BANDAGES/DRESSINGS) IMPLANT
BNDG GAUZE ELAST 4 BULKY (GAUZE/BANDAGES/DRESSINGS) ×3 IMPLANT
CLOTH 2% CHLOROHEXIDINE 3PK (PERSONAL CARE ITEMS) IMPLANT
DERMABOND ADVANCED (GAUZE/BANDAGES/DRESSINGS) ×2
DERMABOND ADVANCED .7 DNX12 (GAUZE/BANDAGES/DRESSINGS) ×1 IMPLANT
DRAPE C-ARM 42X120 X-RAY (DRAPES) IMPLANT
DRAPE STERI IOBAN 125X83 (DRAPES) ×3 IMPLANT
DRILL BIT CANN LG 4.3MM (BIT) ×3
DRSG AQUACEL AG ADV 3.5X 6 (GAUZE/BANDAGES/DRESSINGS) ×6 IMPLANT
DURAPREP 26ML APPLICATOR (WOUND CARE) ×3 IMPLANT
ELECT REM PT RETURN 9FT ADLT (ELECTROSURGICAL) ×3
ELECTRODE REM PT RTRN 9FT ADLT (ELECTROSURGICAL) ×1 IMPLANT
GLOVE BIOGEL PI IND STRL 7.5 (GLOVE) ×1 IMPLANT
GLOVE BIOGEL PI IND STRL 8 (GLOVE) ×1 IMPLANT
GLOVE BIOGEL PI INDICATOR 7.5 (GLOVE) ×2
GLOVE BIOGEL PI INDICATOR 8 (GLOVE) ×2
GLOVE SURG SS PI 7.5 STRL IVOR (GLOVE) ×3 IMPLANT
GLOVE SURG SS PI 8.0 STRL IVOR (GLOVE) IMPLANT
GOWN STRL REUS W/ TWL XL LVL3 (GOWN DISPOSABLE) IMPLANT
GOWN STRL REUS W/TWL LRG LVL3 (GOWN DISPOSABLE) ×3 IMPLANT
GOWN STRL REUS W/TWL XL LVL3 (GOWN DISPOSABLE) ×6 IMPLANT
GUIDEPIN 3.2X17.5 THRD DISP (PIN) ×3 IMPLANT
HIP FRAC NAIL LAG SCR 10.5X100 (Orthopedic Implant) ×2 IMPLANT
KIT BASIN OR (CUSTOM PROCEDURE TRAY) ×3 IMPLANT
MANIFOLD NEPTUNE II (INSTRUMENTS) IMPLANT
NAIL HIP FRACT 130D 9X180 (Orthopedic Implant) ×3 IMPLANT
PACK GENERAL/GYN (CUSTOM PROCEDURE TRAY) ×3 IMPLANT
PAD ABD 8X10 STRL (GAUZE/BANDAGES/DRESSINGS) ×3 IMPLANT
PAD CAST 4YDX4 CTTN HI CHSV (CAST SUPPLIES) ×1 IMPLANT
PADDING CAST COTTON 4X4 STRL (CAST SUPPLIES) ×2
SCREW BONE CORTICAL 5.0X3 (Screw) ×3 IMPLANT
SCREW CANN THRD AFF 10.5X100 (Orthopedic Implant) ×1 IMPLANT
SPONGE GAUZE 4X4 12PLY (GAUZE/BANDAGES/DRESSINGS) IMPLANT
STAPLER VISISTAT (STAPLE) IMPLANT
SUT MNCRL AB 4-0 PS2 18 (SUTURE) ×3 IMPLANT
SUT VIC AB 0 CT1 27 (SUTURE)
SUT VIC AB 0 CT1 27XBRD ANTBC (SUTURE) IMPLANT
SUT VIC AB 1 CT1 36 (SUTURE) ×3 IMPLANT
SUT VIC AB 2-0 CT1 27 (SUTURE)
SUT VIC AB 2-0 CT1 TAPERPNT 27 (SUTURE) IMPLANT
TRAY FOLEY CATH 14FRSI W/METER (CATHETERS) ×3 IMPLANT

## 2013-07-30 NOTE — Progress Notes (Signed)
Clinical Social Work Department BRIEF PSYCHOSOCIAL ASSESSMENT 07/30/2013  Patient:  Beverly Barnett,Beverly Barnett     Account Number:  [id number]     Admit date:  07/29/2013  Clinical Social Worker:  Candie ChromanHAIDINGER,Shima Compere, LCSW  Date/Time:  07/30/2013 03:20 PM  Referred by:  Physician  Date Referred:  07/30/2013 Referred for  SNF Placement   Other Referral:   Interview type:  Patient Other interview type:    PSYCHOSOCIAL DATA Living Status:  ALONE Admitted from facility:   Level of care:   Primary support name:  Amy Callaghan Primary support relationship to patient:   Degree of support available:    CURRENT CONCERNS Current Concerns  Post-Acute Placement   Other Concerns:    SOCIAL WORK ASSESSMENT / PLAN Pt is an 80 yr old female living at home prior to hospitalization. Pt fell and fx her hip. She is having surgery this afternoon. ST Rehab will be needed following hospital Barnett/c. Pt is in agreement with this plan . SNF search will be initiated and bed offers will be provided as received. Pt has been to [facility] in the past and would like her rehab at that facility. [facility] has been contacted and a decision is pending.   Assessment/plan status:  Psychosocial Support/Ongoing Assessment of Needs Other assessment/ plan:   Information/referral to community resources:   SNF list with bed offers will be provided. Insurance coverage for SNF and ambulance transport reviewed.    PATIENT'S/FAMILY'S RESPONSE TO PLAN OF CARE: Pt is hopeful that [facility] will have an opening for her rehab.    Cori RazorJamie Brodi Nery LCSW [phone]

## 2013-07-30 NOTE — Progress Notes (Signed)
  Echocardiogram 2D Echocardiogram has been performed.  Jorje GuildCHUNG, Halil Rentz 07/30/2013, 12:55 PM

## 2013-07-30 NOTE — Progress Notes (Signed)
INITIAL NUTRITION ASSESSMENT  DOCUMENTATION CODES Per approved criteria  -Not Applicable   INTERVENTION: - Diet advancement per MD - Educated pt on high calorie/protein diet therapy to promote strength building. Encouraged pt to consume 2 Ensure Complete/day after surgery once diet advanced. - Will continue to monitor   NUTRITION DIAGNOSIS: Inadequate oral intake related to inability to eat as evidenced by NPO for surgery.   Goal: Advance diet as tolerated to diabetic diet  Monitor:  Weights, labs, diet advancement  Reason for Assessment: Consult for hip fracture protocol   80 y.o. female  Admitting Dx: Left hip pain s/p fall  ASSESSMENT: Pt with history of hypertension osteoporosis, vascular dementia who presents with above complaints. She states that while in the kitchen yesterday she sustained a mechanical fall and began having pain in her left hip. She reports that she had had a right hip fracture years ago and had surgery done and so she went to the office and x-rays today showed a comminuted left femur intertrochanteric fracture. Plan is for ORIF of left femur today.  Met with pt who reports eating 3 meals/day at home of easy to prepare foods, typically meals that she microwaves. States these are well balanced meals. Reports 6 pound unintended weight loss in the past 2 months r/t sometimes eating less at mealtimes. Denies any problems chewing or swallowing. Drinks 1 Ensure or Boost per day but has been increasing intake to 2 per day in the past week.   Nutrition Focused Physical Exam:  Subcutaneous Fat:  Orbital Region: WNL Upper Arm Region: mild/moderate fat loss Thoracic and Lumbar Region: WNL  Muscle:  Temple Region: WNL Clavicle Bone Region: mild/moderate fat loss Clavicle and Acromion Bone Region: mild/moderate fat loss Scapular Bone Region: NA Dorsal Hand: mild/moderate fat loss Patellar Region: NA Anterior Thigh Region: NA Posterior Calf Region: NA  Edema:   None noted    Height: Ht Readings from Last 1 Encounters:  07/29/13 5' (1.524 m)    Weight: Wt Readings from Last 1 Encounters:  07/29/13 119 lb (53.978 kg)    Ideal Body Weight: 100 lb  % Ideal Body Weight: 119%  Wt Readings from Last 10 Encounters:  07/29/13 119 lb (53.978 kg)  07/29/13 119 lb (53.978 kg)  07/20/13 119 lb (53.978 kg)  07/06/13 119 lb 12.8 oz (54.341 kg)  06/22/13 120 lb 3.2 oz (54.522 kg)  02/16/13 118 lb (53.524 kg)  01/05/13 118 lb (53.524 kg)  07/24/11 146 lb 9.7 oz (66.5 kg)  07/24/11 146 lb 9.7 oz (66.5 kg)    Usual Body Weight: 125 lb 2 months ago per pt  % Usual Body Weight: 95%  BMI:  Body mass index is 23.24 kg/(m^2).  Estimated Nutritional Needs: Kcal: 2831-5176 Protein: 65-80g Fluid: 1.3-1.5L/day  Skin: Intact  Diet Order: NPO  EDUCATION NEEDS: -Education needs addressed - educated pt on high calorie/protein diet therapy to promote strength building   Intake/Output Summary (Last 24 hours) at 07/30/13 1019 Last data filed at 07/30/13 0900  Gross per 24 hour  Intake    425 ml  Output    300 ml  Net    125 ml    Last BM: 2/18  Labs:   Recent Labs Lab 07/29/13 1905 07/30/13 0422  NA 139 141  K 4.1 4.0  CL 101 105  CO2 25 25  BUN 19 24*  CREATININE 0.80 1.01  CALCIUM 9.4 8.7  GLUCOSE 131* 127*    CBG (last 3)   Recent  Labs  07/29/13 2156 07/30/13 0728  GLUCAP 121* 121*    Scheduled Meds: . amLODipine  10 mg Oral Daily  .  ceFAZolin (ANCEF) IV  2 g Intravenous On Call to OR  . docusate sodium  100 mg Oral BID  . donepezil  5 mg Oral QHS  . insulin aspart  0-5 Units Subcutaneous QHS  . insulin aspart  0-9 Units Subcutaneous TID WC  . [START ON 07/31/2013] losartan  50 mg Oral Daily  . metoprolol  50 mg Oral BID  . multivitamin with minerals  1 tablet Oral BID  . simvastatin  20 mg Oral QPC supper    Continuous Infusions: . sodium chloride 50 mL (07/29/13 2130)    Past Medical History   Diagnosis Date  . Hypertension   . High cholesterol   . Osteoporosis   . Type II or unspecified type diabetes mellitus without mention of complication, not stated as uncontrolled   . Hyperosmolality and/or hypernatremia   . Dysphagia, unspecified(787.20)   . Other atopic dermatitis and related conditions   . Encounter for long-term (current) use of other medications   . Cardiac catheterisation as the cause of abnormal reaction of patient, or of later complication 95/18/8416    Past Surgical History  Procedure Laterality Date  . Femur surgery Right 03/2010  . Shoulder surgery Left 03/2010  . Femur im nail  07/24/2011    Procedure: INTRAMEDULLARY (IM) NAIL FEMORAL;  Surgeon: Johnn Hai, MD;  Location: [facility];  Service: Orthopedics;  Laterality: Right;  Biomet, c-arm, Jackson table    Mikey College MS, RD, LDN [phone] Pager [phone] After Hours Pager

## 2013-07-30 NOTE — Progress Notes (Signed)
[facility] PROGRESS NOTE  Beverly Barnett ZOX:[id number] DOB: 20-Mar-1924 DOA: 07/29/2013 PCP: Oneal Grout, MD  Assessment/Plan: Intertrochanteric fracture of left hip  - per orthopedics surgery planned in a.m.  -patient denies chest pain and dyspnea. She has murmur. Prior history of mitral valve regurgitation. Will repeat ECHO to make sure is stable.   History of normocytic Anemia  -Likely chronic disease, hb decrease to 9, probably hemodilution.   Leukocytosis  -Possibly reactive. Cbc normalized.  -UA negative. Chest x ray ;  No acute cardiopulmonary abnormality.  Hypertension  -Hold Norvasc, cozaar. SBP in the 90 range.   Vascular dementia  -Continue outpatient medications   Code Status: full Code.  Family Communication: care discussed with patient.  Disposition Plan: to be determine.    Consultants:  Ortho.   Procedures:  ECHO pending.   Antibiotics:  none  HPI/Subjective: Denies chest pain or dyspnea at rest and on exertion.   Objective: Filed Vitals:   07/30/13 0931  BP: 99/54  Pulse: 67  Temp:   Resp:     Intake/Output Summary (Last 24 hours) at 07/30/13 1044 Last data filed at 07/30/13 0900  Gross per 24 hour  Intake    425 ml  Output    300 ml  Net    125 ml   Filed Weights   07/29/13 1730  Weight: 53.978 kg (119 lb)    Exam:   General:  No distress.   Cardiovascular: S 1, S 2 continues murmur, louder mitral valve area.   Respiratory: CTA  Abdomen: BS present, soft, NT  Musculoskeletal: no edema.   Data Reviewed: Basic Metabolic Panel:  Recent Labs Lab 07/29/13 1905 07/30/13 0422  NA 139 141  K 4.1 4.0  CL 101 105  CO2 25 25  GLUCOSE 131* 127*  BUN 19 24*  CREATININE 0.80 1.01  CALCIUM 9.4 8.7   Liver Function Tests: No results found for this basename: AST, ALT, ALKPHOS, BILITOT, PROT, ALBUMIN,  in the last 168 hours No results found for this basename: LIPASE, AMYLASE,  in the last 168 hours No results found  for this basename: AMMONIA,  in the last 168 hours CBC:  Recent Labs Lab 07/29/13 1905 07/30/13 0422  WBC 13.3* 9.8  HGB 11.7* 9.7*  HCT 35.7* 29.5*  MCV 91.8 91.9  PLT 198 169   Cardiac Enzymes: No results found for this basename: CKTOTAL, CKMB, CKMBINDEX, TROPONINI,  in the last 168 hours BNP (last 3 results) No results found for this basename: PROBNP,  in the last 8760 hours CBG:  Recent Labs Lab 07/29/13 2156 07/30/13 0728  GLUCAP 121* 121*    Recent Results (from the past 240 hour(s))  SURGICAL PCR SCREEN     Status: None   Collection Time    07/30/13  3:28 AM      Result Value Ref Range Status   MRSA, PCR NEGATIVE  NEGATIVE Final   Staphylococcus aureus NEGATIVE  NEGATIVE Final   Comment:            The Xpert SA Assay (FDA     approved for NASAL specimens     in patients over 30 years of age),     is one component of     a comprehensive surveillance     program.  Test performance has     been validated by [employer] for patients greater     than or equal to 83 year old.  It is not intended     to diagnose infection nor to     guide or monitor treatment.     Studies: Dg Chest Portable 1 View  07/29/2013   CLINICAL DATA:  80 year old female preoperative study for left hip ORIF. Initial encounter.  EXAM: PORTABLE CHEST - 1 VIEW  COMPARISON:  07/23/2011.  FINDINGS: Portable AP semi upright view at 1854 hr. Mildly lower lung volumes. Normal cardiac size and mediastinal contours. Visualized tracheal air column is within normal limits. Allowing for portable technique, the lungs are clear. Chronic proximal left humerus ORIF hardware.  IMPRESSION: No acute cardiopulmonary abnormality.   Electronically Signed   By: Augusto GambleLee  Hall M.D.   On: 07/29/2013 19:15   Dg Hip Portable 1 View Left  07/29/2013   CLINICAL DATA:  80 year old female preoperative study for left hip fixation. Initial encounter.  EXAM: PORTABLE LEFT HIP - 1 VIEW  COMPARISON:  None.  FINDINGS:  Portable AP view at 1857 hr demonstrating comminuted intertrochanteric fracture. Distal fragment minimally impacted. Lesser trochanter fragment mildly retracted. There appears to be a longitudinal component of the fracture extending into the left femoral neck. Left femoral head appears to remain normally located. Visible left hemipelvis intact. Calcified atherosclerosis.  IMPRESSION: Comminuted left femur intertrochanteric fracture.   Electronically Signed   By: Augusto GambleLee  Hall M.D.   On: 07/29/2013 19:14    Scheduled Meds: . amLODipine  10 mg Oral Daily  .  ceFAZolin (ANCEF) IV  2 g Intravenous On Call to OR  . docusate sodium  100 mg Oral BID  . donepezil  5 mg Oral QHS  . insulin aspart  0-5 Units Subcutaneous QHS  . insulin aspart  0-9 Units Subcutaneous TID WC  . [START ON 07/31/2013] losartan  50 mg Oral Daily  . metoprolol  50 mg Oral BID  . multivitamin with minerals  1 tablet Oral BID  . simvastatin  20 mg Oral QPC supper   Continuous Infusions: . sodium chloride 50 mL (07/29/13 2130)    Active Problems:   Hypertension   Vascular dementia   Essential hypertension, benign   Intertrochanteric fracture of left hip   Anemia   Hip fracture, left    Time spent: 35 minutes.     Filomeno Cromley  [facility] Pager [phone]. If 7PM-7AM, please contact night-coverage at [email or web address], password [email or web address] 07/30/2013, 10:44 AM  LOS: 1 day

## 2013-07-30 NOTE — Transfer of Care (Signed)
Immediate Anesthesia Transfer of Care Note  Patient: Beverly Barnett  Procedure(s) Performed: Procedure(s): INTRAMEDULLARY (IM) NAIL INTERTROCHANTRIC LEFT (Left)  Patient Location: PACU  Anesthesia Type:General  Level of Consciousness: awake and alert   Airway & Oxygen Therapy: Patient Spontanous Breathing and Patient connected to face mask oxygen  Post-op Assessment: Report given to PACU RN and Post -op Vital signs reviewed and stable  Post vital signs: Reviewed and stable  Complications: No apparent anesthesia complications

## 2013-07-30 NOTE — Brief Op Note (Signed)
07/29/2013 - 07/30/2013  10:12 PM  PATIENT:  Beverly Barnett  80 y.o. female  PRE-OPERATIVE DIAGNOSIS:  Left intertrochanteric hip fracture  POST-OPERATIVE DIAGNOSIS:  Left intertrochanteric hip fracture  PROCEDURE:  Procedure(s): INTRAMEDULLARY (IM) NAIL INTERTROCHANTRIC LEFT (Left)  SURGEON:  Surgeon(s) and Role:    * Shelda PalMatthew D Kalep Full, MD - Primary  PHYSICIAN ASSISTANT: No  ASSISTANTS: Surgical team  ANESTHESIA:   general  EBL:   50-100cc  BLOOD ADMINISTERED:none  DRAINS: none   LOCAL MEDICATIONS USED:  NONE  SPECIMEN:  No Specimen  DISPOSITION OF SPECIMEN:  N/A  COUNTS:  YES  TOURNIQUET:  * No tourniquets in log *  DICTATION: .Other Dictation: Dictation Number [id number]  PLAN OF CARE: Admit to inpatient   PATIENT DISPOSITION:  PACU - hemodynamically stable.   Delay start of Pharmacological VTE agent (>24hrs) due to surgical blood loss or risk of bleeding: no

## 2013-07-30 NOTE — Progress Notes (Signed)
   Subjective: Day of Surgery Procedure(s) (LRB): INTRAMEDULLARY (IM) NAIL INTERTROCHANTRIC LEFT (Left)    Patient reports pain as mild, pain controled. No events throughout the night.  Objective:   VITALS:   Filed Vitals:   07/30/13 0447  BP: 105/59  Pulse: 64  Temp: 98.2 F (36.8 C)  Resp: 12    Neurovascular intact Dorsiflexion/Plantar flexion intact No cellulitis present Compartment soft  LABS  Recent Labs  07/29/13 1905 07/30/13 0422  HGB 11.7* 9.7*  HCT 35.7* 29.5*  WBC 13.3* 9.8  PLT 198 169     Recent Labs  07/29/13 1905 07/30/13 0422  NA 139 141  K 4.1 4.0  BUN 19 24*  CREATININE 0.80 1.01  GLUCOSE 131* 127*     Assessment/Plan: Day of Surgery Procedure(s) (LRB): INTRAMEDULLARY (IM) NAIL INTERTROCHANTRIC LEFT (Left)   NPO since this AM  Plan for ORIF of the left femur today per Dr. Charlann Boxerlin  Dr. Charlann Boxerlin discussed the case with the patient. Risks, benefits and expectations were discussed with the patient.  Risks including but not limited to the risk of anesthesia, blood clots, nerve damage, blood vessel damage, failure of the prosthesis, infection and up to and including death.  Patient understand the risks, benefits and expectations and wishes to proceed with surgery.    Beverly Barnett   PAC  07/30/2013, 9:31 AM

## 2013-07-30 NOTE — Anesthesia Preprocedure Evaluation (Addendum)
Anesthesia Evaluation  Patient identified by MRN, date of birth, ID band Patient awake  General Assessment Comment: Hypertension     .  High cholesterol     .  Osteoporosis     .  Type II or unspecified type diabetes mellitus without mention of complication, not stated as uncontrolled     .  Hyperosmolality and/or hypernatremia     .  Dysphagia, unspecified(787.20)     .  Other atopic dermatitis and related conditions     .  Encounter for long-term (current) use of other medications     .  Cardiac catheterisation as the cause of abnormal reaction of patient, or of later complication  10/05/2006     Reviewed: Allergy & Precautions, H&P , NPO status , Patient's Chart, lab work & pertinent test results  Airway Mallampati: II TM Distance: >3 FB Neck ROM: Full    Dental  (+) Missing, Poor Dentition, Dental Advisory Given   Pulmonary neg pulmonary ROS,  breath sounds clear to auscultation  Pulmonary exam normal       Cardiovascular Exercise Tolerance: Good hypertension, Pt. on medications and Pt. on home beta blockers Rhythm:Regular Rate:Normal  ECHO reviewed. EF normal.   Neuro/Psych PSYCHIATRIC DISORDERS Vascular dementia. negative psych ROS   GI/Hepatic negative GI ROS, Neg liver ROS,   Endo/Other  diabetes  Renal/GU negative Renal ROS  negative genitourinary   Musculoskeletal negative musculoskeletal ROS (+)   Abdominal   Peds negative pediatric ROS (+)  Hematology  (+) anemia ,   Anesthesia Other Findings   Reproductive/Obstetrics negative OB ROS                         Anesthesia Physical Anesthesia Plan  ASA: III  Anesthesia Plan: General   Post-op Pain Management:    Induction: Intravenous  Airway Management Planned: Oral ETT  Additional Equipment:   Intra-op Plan:   Post-operative Plan: Extubation in OR  Informed Consent: I have reviewed the patients History and Physical,  chart, labs and discussed the procedure including the risks, benefits and alternatives for the proposed anesthesia with the patient or authorized representative who has indicated his/her understanding and acceptance.   Dental advisory given  Plan Discussed with: CRNA  Anesthesia Plan Comments: (She did well with general anesthesia 07-24-11 for hip fracture. Prefers general today.)       Anesthesia Quick Evaluation

## 2013-07-30 NOTE — Progress Notes (Signed)
Clinical Social Work Department CLINICAL SOCIAL WORK PLACEMENT NOTE 07/30/2013  Patient:  Aurea GraffRACE,Ahria D  Account Number:  [id number] Admit date:  07/29/2013  Clinical Social Worker:  Cori RazorJAMIE Altonio Schwertner, LCSW  Date/time:  07/30/2013 03:31 PM  Clinical Social Work is seeking post-discharge placement for this patient at the following level of care:   SKILLED NURSING   (*CSW will update this form in Epic as items are completed)     Patient/family provided with [facility] of [facility] Work's list of facilities offering this level of care within the geographic area requested by the patient (or if unable, by the patient's family).  07/30/2013  Patient/family informed of their freedom to choose among providers that offer the needed level of care, that participate in Medicare, Medicaid or managed care program needed by the patient, have an available bed and are willing to accept the patient.    Patient/family informed of [facility]' ownership interest in [facility], as well as of the fact that they are under no obligation to receive care at this facility.  PASARR submitted to [employer] on  PASARR number received from [employer] on 03/06/2010  FL2 transmitted to all facilities in geographic area requested by pt/family on  07/30/2013 FL2 transmitted to all facilities within larger geographic area on   Patient informed that his/her managed care company has contracts with or will negotiate with  certain facilities, including the following:     Patient/family informed of bed offers received:   Patient chooses bed at  Physician recommends and patient chooses bed at    Patient to be transferred to  on   Patient to be transferred to facility by   The following physician request were entered in Epic:   Additional Comments:  Cori RazorJamie Zarria Towell LCSW [phone]

## 2013-07-30 NOTE — Anesthesia Postprocedure Evaluation (Signed)
  Anesthesia Post-op Note  Patient: Beverly Barnett  Procedure(s) Performed: Procedure(s) (LRB): INTRAMEDULLARY (IM) NAIL INTERTROCHANTRIC LEFT (Left)  Patient Location: PACU  Anesthesia Type: General  Level of Consciousness: awake and alert   Airway and Oxygen Therapy: Patient Spontanous Breathing  Post-op Pain: mild  Post-op Assessment: Post-op Vital signs reviewed, Patient's Cardiovascular Status Stable, Respiratory Function Stable, Patent Airway and No signs of Nausea or vomiting  Last Vitals:  Filed Vitals:   07/30/13 1945  BP: 144/71  Pulse: 84  Temp: 36.4 C  Resp: 12    Post-op Vital Signs: stable   Complications: No apparent anesthesia complications

## 2013-07-31 DIAGNOSIS — E7800 Pure hypercholesterolemia, unspecified: Secondary | ICD-10-CM

## 2013-07-31 LAB — CBC
HEMATOCRIT: 23.6 % — AB (ref 36.0–46.0)
Hemoglobin: 8 g/dL — ABNORMAL LOW (ref 12.0–15.0)
MCH: 31.1 pg (ref 26.0–34.0)
MCHC: 33.9 g/dL (ref 30.0–36.0)
MCV: 91.8 fL (ref 78.0–100.0)
PLATELETS: 145 10*3/uL — AB (ref 150–400)
RBC: 2.57 MIL/uL — ABNORMAL LOW (ref 3.87–5.11)
RDW: 13.4 % (ref 11.5–15.5)
WBC: 10.6 10*3/uL — ABNORMAL HIGH (ref 4.0–10.5)

## 2013-07-31 LAB — BASIC METABOLIC PANEL
BUN: 16 mg/dL (ref 6–23)
CO2: 26 mEq/L (ref 19–32)
Calcium: 8.2 mg/dL — ABNORMAL LOW (ref 8.4–10.5)
Chloride: 103 mEq/L (ref 96–112)
Creatinine, Ser: 0.76 mg/dL (ref 0.50–1.10)
GFR calc Af Amer: 85 mL/min — ABNORMAL LOW (ref >90)
GFR, EST NON AFRICAN AMERICAN: 74 mL/min — AB (ref >90)
Glucose, Bld: 131 mg/dL — ABNORMAL HIGH (ref 70–99)
POTASSIUM: 3.9 meq/L (ref 3.7–5.3)
Sodium: 139 mEq/L (ref 137–147)

## 2013-07-31 LAB — GLUCOSE, CAPILLARY
GLUCOSE-CAPILLARY: 121 mg/dL — AB (ref 70–99)
GLUCOSE-CAPILLARY: 137 mg/dL — AB (ref 70–99)
GLUCOSE-CAPILLARY: 141 mg/dL — AB (ref 70–99)
Glucose-Capillary: 128 mg/dL — ABNORMAL HIGH (ref 70–99)

## 2013-07-31 MED ORDER — HYDROCODONE-ACETAMINOPHEN 5-325 MG PO TABS
1.0000 | ORAL_TABLET | Freq: Four times a day (QID) | ORAL | Status: DC | PRN
  Administered 2013-07-31 – 2013-08-02 (×5): 1 via ORAL
  Filled 2013-07-31 (×5): qty 1

## 2013-07-31 MED ORDER — ACETAMINOPHEN 325 MG PO TABS
650.0000 mg | ORAL_TABLET | Freq: Four times a day (QID) | ORAL | Status: DC | PRN
  Administered 2013-08-02: 650 mg via ORAL
  Filled 2013-07-31: qty 2

## 2013-07-31 MED ORDER — POLYETHYLENE GLYCOL 3350 17 G PO PACK
17.0000 g | PACK | Freq: Every day | ORAL | Status: DC
  Administered 2013-07-31 – 2013-08-01 (×2): 17 g via ORAL

## 2013-07-31 NOTE — Progress Notes (Addendum)
[facility] PROGRESS NOTE  Beverly Barnett ZOX:[id number] DOB: 19-Sep-1923 DOA: 07/29/2013 PCP: Oneal Grout, MD  Assessment/Plan: Intertrochanteric fracture of left hip ;  Patient S/P Left Intramedullary Nail Intertrochanteric 2-20. Patient tolerated procedure well.  Aspirin for DVT prophylax.  Docusate BID, Miralax for bowel regimen.   Delirium; was confuse last night and early this morning. Patient alert, oriented to place and situation. Avoid high dose opioid.   History of normocytic Anemia // Acute Blood loss Anemia; Post surgery expected.  -Likely chronic disease with acute blood loss post surgery.  -Hb at 8, monitor. If Hb decrease tomorrow might need blood transfusion.   Mitral Valve Regurgitation:  Stable by ECHO. Asymptomatic. Needs to follow up with primary cardiologist. Family aware.   Leukocytosis  -Possibly reactive. Cbc normalized.  -UA negative. Chest x ray ; No acute cardiopulmonary abnormality.  Hypertension  -Hold Norvasc, cozaar. SBP in the 110 range.   Vascular dementia  -Continue outpatient medications.   Code Status: full Code.  Family Communication: care discussed with patient.  Disposition Plan: to be determine.    Consultants:  Dr Charlann Boxer.   Procedures: ECHO: Normal LV function; basal septal hypertrophy and chordal SAM; elevated LVOT velocity; moderate to severe MR;   Antibiotics:  none  HPI/Subjective: Denies chest pain or dyspnea at rest and on exertion.   Objective: Filed Vitals:   07/31/13 1046  BP: 118/62  Pulse: 71  Temp: 98.3 F (36.8 C)  Resp: 16    Intake/Output Summary (Last 24 hours) at 07/31/13 1327 Last data filed at 07/31/13 0932  Gross per 24 hour  Intake   2615 ml  Output   1350 ml  Net   1265 ml   Filed Weights   07/29/13 1730  Weight: 53.978 kg (119 lb)    Exam:   General:  No distress.   Cardiovascular: S 1, S 2 continues murmur, louder mitral valve area.   Respiratory: CTA  Abdomen: BS  present, soft, NT  Musculoskeletal: no edema.   Data Reviewed: Basic Metabolic Panel:  Recent Labs Lab 07/29/13 1905 07/30/13 0422 07/31/13 0800  NA 139 141 139  K 4.1 4.0 3.9  CL 101 105 103  CO2 25 25 26   GLUCOSE 131* 127* 131*  BUN 19 24* 16  CREATININE 0.80 1.01 0.76  CALCIUM 9.4 8.7 8.2*   Liver Function Tests: No results found for this basename: AST, ALT, ALKPHOS, BILITOT, PROT, ALBUMIN,  in the last 168 hours No results found for this basename: LIPASE, AMYLASE,  in the last 168 hours No results found for this basename: AMMONIA,  in the last 168 hours CBC:  Recent Labs Lab 07/29/13 1905 07/30/13 0422 07/31/13 0800  WBC 13.3* 9.8 10.6*  HGB 11.7* 9.7* 8.0*  HCT 35.7* 29.5* 23.6*  MCV 91.8 91.9 91.8  PLT 198 169 145*   Cardiac Enzymes: No results found for this basename: CKTOTAL, CKMB, CKMBINDEX, TROPONINI,  in the last 168 hours BNP (last 3 results) No results found for this basename: PROBNP,  in the last 8760 hours CBG:  Recent Labs Lab 07/30/13 1109 07/30/13 1841 07/30/13 2138 07/31/13 0753 07/31/13 1215  GLUCAP 113* 102* 130* 128* 121*    Recent Results (from the past 240 hour(s))  SURGICAL PCR SCREEN     Status: None   Collection Time    07/30/13  3:28 AM      Result Value Ref Range Status   MRSA, PCR NEGATIVE  NEGATIVE Final   Staphylococcus aureus NEGATIVE  NEGATIVE Final   Comment:            The Xpert SA Assay (FDA     approved for NASAL specimens     in patients over 78 years of age),     is one component of     a comprehensive surveillance     program.  Test performance has     been validated by [facility] for patients greater     than or equal to 78 year old.     It is not intended     to diagnose infection nor to     guide or monitor treatment.     Studies: Dg Hip Operative Left  07/30/2013   CLINICAL DATA:  Left femoral intramedullary nail placement.  EXAM: OPERATIVE LEFT HIP  COMPARISON:  Left hip radiographs  performed 07/29/2013  FINDINGS: Three fluoroscopic C-arm images demonstrate successful placement of an intramedullary nail within the proximal left femur, transfixing the intertrochanteric fracture in grossly anatomic alignment. A mildly displaced lesser trochanteric fragment is again seen.  The left femoral head remains seated at the acetabulum. No new fractures are identified.  IMPRESSION: Successful internal fixation of left femoral intertrochanteric fracture in grossly anatomic alignment. Mildly displaced lesser trochanteric fragment again seen.   Electronically Signed   By: Roanna RaiderJeffery  Chang M.D.   On: 07/30/2013 22:02   Dg Pelvis Portable  07/30/2013   CLINICAL DATA:  Status post internal fixation of left femoral intertrochanteric fracture.  EXAM: PORTABLE PELVIS 1-2 VIEWS  COMPARISON:  Left hip radiographs performed 07/29/2013  FINDINGS: There has been successful placement of an intramedullary nail within the proximal left femur, transfixing the patient's intertrochanteric fracture in grossly anatomic alignment. A mildly displaced lesser trochanteric fragment is again seen.  The patient's right-sided femoral hardware is grossly unchanged in appearance; no new fractures are seen. Both femoral heads remain within their respective acetabula. Scattered postoperative soft tissue air is noted overlying the left hip.  IMPRESSION: Successful internal fixation of left-sided intertrochanteric fracture, in grossly anatomic alignment. Mildly displaced lesser trochanteric fragment again seen. No new fracture identified.   Electronically Signed   By: Roanna RaiderJeffery  Chang M.D.   On: 07/30/2013 21:59   Dg Chest Portable 1 View  07/29/2013   CLINICAL DATA:  [age] year old female preoperative study for left hip ORIF. Initial encounter.  EXAM: PORTABLE CHEST - 1 VIEW  COMPARISON:  07/23/2011.  FINDINGS: Portable AP semi upright view at 1854 hr. Mildly lower lung volumes. Normal cardiac size and mediastinal contours. Visualized  tracheal air column is within normal limits. Allowing for portable technique, the lungs are clear. Chronic proximal left humerus ORIF hardware.  IMPRESSION: No acute cardiopulmonary abnormality.   Electronically Signed   By: Augusto GambleLee  Hall M.D.   On: 07/29/2013 19:15   Dg Hip Portable 1 View Left  07/29/2013   CLINICAL DATA:  [age] year old female preoperative study for left hip fixation. Initial encounter.  EXAM: PORTABLE LEFT HIP - 1 VIEW  COMPARISON:  None.  FINDINGS: Portable AP view at 1857 hr demonstrating comminuted intertrochanteric fracture. Distal fragment minimally impacted. Lesser trochanter fragment mildly retracted. There appears to be a longitudinal component of the fracture extending into the left femoral neck. Left femoral head appears to remain normally located. Visible left hemipelvis intact. Calcified atherosclerosis.  IMPRESSION: Comminuted left femur intertrochanteric fracture.   Electronically Signed   By: Augusto GambleLee  Hall M.D.   On: 07/29/2013 19:14    Scheduled Meds: . aspirin  EC  325 mg Oral BID  . docusate sodium  100 mg Oral BID  . donepezil  5 mg Oral QHS  . ferrous sulfate  325 mg Oral TID PC  . insulin aspart  0-5 Units Subcutaneous QHS  . insulin aspart  0-9 Units Subcutaneous TID WC  . metoprolol  50 mg Oral BID  . multivitamin with minerals  1 tablet Oral BID  . simvastatin  20 mg Oral QPC supper   Continuous Infusions:    Active Problems:   Hypertension   Vascular dementia   Essential hypertension, benign   Intertrochanteric fracture of left hip   Anemia   Hip fracture, left    Time spent: 30 minutes.     Beverly Barnett  [facility] Pager [phone]. If 7PM-7AM, please contact night-coverage at [email or web address], password [facility] 07/31/2013, 1:27 PM  LOS: 2 days

## 2013-07-31 NOTE — Progress Notes (Signed)
Provided bed offers to Pt and son and answered all questions.  Pt interested in [facility].  She will discuss options with family and have a decision by Monday.  CSW thanked Pt and her brother for their time.  Providence CrosbyAmanda Vanna Shavers, LCSWA Clinical Social Work [phone]

## 2013-07-31 NOTE — Progress Notes (Signed)
OT Cancellation Note  Patient Details Name: Beverly Barnett MRN: [id number] DOB: 02/21/1924   Cancelled Treatment:    Reason Eval/Treat Not Completed: Other (comment) (D/c plan is SNF; will defer OT eval/tx to SNF)  Sundy Houchins A 07/31/2013, 8:34 AM

## 2013-07-31 NOTE — Op Note (Signed)
NAMMoshe Cipro:  Nelms, Margaretha                  ACCOUNT NO.:  [id number]  MEDICAL RECORD NO.:  [id number]  LOCATION:  1611                         [facility]  PHYSICIAN:  Madlyn FrankelMatthew D. Charlann Boxerlin, M.D.  DATE OF BIRTH:  05-11-1924  DATE OF PROCEDURE:  07/30/2013 DATE OF DISCHARGE:                              OPERATIVE REPORT   PREOPERATIVE DIAGNOSIS:  Left comminuted intertrochanteric femur fracture.  POSTOPERATIVE DIAGNOSIS:  Left comminuted intertrochanteric femur fracture.  PROCEDURE:  Open reduction internal fixation of left intertrochanteric femur fracture, utilizing the Biomet IM nail, Affixus nail 9 x 170 mm with a 130-degree lag screw measuring a 100 mm with a distal interlock.  SURGEON:  Madlyn FrankelMatthew D. Charlann Boxerlin, M.D.  ASSISTANTS:  Surgical team.  ANESTHESIA:  General.  SPECIMENS:  None.  COMPLICATIONS:  None.  BLOOD LOSS:  Minimal, 50-100 mL.  DRAINS:  None.  INDICATION FOR PROCEDURE:  Ms. Feliberto Hartsrace is an 80 year old female, [place]  She unfortunately had a fall at home and had hip pain.  She had actually presented to our office on February 19 with complaints of hip pain after the fall, where radiographs in the office revealed a comminuted intertrochanteric femur fracture.  She had had a previous right-sided intertrochanteric femur fracture fixed by Dr. Jene EveryJeffrey Beane.  She was subsequently admitted to the hospital through our hospitalist program and fracture arrangements.  She once cleared for surgery, was scheduled for surgery on February 20.  Dr. Shelle IronBeane had initially spoken to her about trying to get her hip fixed early in the day due to conflicts in the OR as well as portion of the conflicts, he is unable to manage to get to it on time.  I was subsequently asked to get involved with definitive care, had a chance to review with her.  The fracture pattern mimicked very similar to the contralateral hip.  We reviewed the indications for the procedure.  The  necessity, the risks of malunion and nonunion, need for future surgery in addition to the infection and DVT, consent was obtained for benefit of fracture management.  I had a chance to speak to her son-in-law on the phone earlier in the morning and her son at the time of surgery.  Consent was obtained for fracture management.  PROCEDURE IN DETAIL:  The patient was brought to the operative theater. Once adequate anesthesia, preoperative antibiotics, Ancef administered; she was positioned supine on the fracture table.  Her currently unaffected right lower extremity was flexed and abducted away with bony prominences, padded particularly of the peroneal nerve.  The left foot was placed in a traction boot and traction, internal rotation applied. Under fluoroscopic imaging, the fracture was reduced to a near-anatomic position.  Once this was confirmed in AP and lateral planes, a time-out was performed identifying the patient, planned procedure, and extremity. The left lower extremity was then prepped and draped in sterile fashion using shower curtain technique.  Fluoroscopy was brought back to the field and landmarks identified, including the tip of the trochanter. Incision was then made proximal to trochanter and sharp dissection was carried through the gluteal fascia.  Guidewire was then inserted into the  tip of the trochanter and passed to the proximal femur.  Once confirmed, the proximal femur was opened with a drill and then the nail selected 9 x 170 mm was passed by hand to its appropriate depth.  Once correctly oriented, a guidewire was inserted under fluoroscopic imaging into the near center of the femoral head slightly anterior and slightly posterior.  This was found to be acceptable.  I measured the depth, drilled, and then placed the 100 mm lag screw, then using the compression wheel, I was able to medialize the shaft of the femur to the fractured intertrochanteric segment.  I  then locked this down to make it a fixed angle device due to the comminution present.  Utilizing the insertion jig, I then placed a 34 mm distal interlock due to static position.  At this point, the jig was removed.  Final radiographs were obtained in AP and lateral planes.  The 2 distal wounds were closed with 2-0 Vicryl and Dermabond on the skin.  The proximal wound was closed in layers with #1 Vicryl deep on the gluteal fascia, 2-0 Vicryl, and then a running 4-0 Monocryl.  All wounds were dressed with Dermabond and Aquacel dressing. She was then extubated and brought to the recovery room in stable condition, tolerating the procedure well.     Madlyn Frankel Charlann Boxer, M.D.     MDO/MEDQ  D:  07/30/2013  T:  07/31/2013  Job:  [id number]

## 2013-07-31 NOTE — Progress Notes (Signed)
Patient ID: Beverly Barnett, female   DOB: 08/26/1923, 80 y.o.   MRN: [id number] Subjective: 1 Day Post-Op Procedure(s) (LRB): INTRAMEDULLARY (IM) NAIL INTERTROCHANTRIC LEFT (Left)    Patient confused last night and this am, not orientated to place situation Pain with movement  Objective:   VITALS:   Filed Vitals:   07/31/13 0642  BP: 120/64  Pulse: 67  Temp: 98.7 F (37.1 C)  Resp: 12    Neurovascular intact Incision: dressing C/D/I  LABS  Recent Labs  07/29/13 1905 07/30/13 0422  HGB 11.7* 9.7*  HCT 35.7* 29.5*  WBC 13.3* 9.8  PLT 198 169     Recent Labs  07/29/13 1905 07/30/13 0422  NA 139 141  K 4.1 4.0  BUN 19 24*  CREATININE 0.80 1.01  GLUCOSE 131* 127*     Recent Labs  07/29/13 1905  INR 1.06     Assessment/Plan: 1 Day Post-Op Procedure(s) (LRB): INTRAMEDULLARY (IM) NAIL INTERTROCHANTRIC LEFT (Left)   Advance diet Up with therapy Discharge to SNF when medically stable Hold narcotics to allow mentation to clear

## 2013-07-31 NOTE — Progress Notes (Signed)
At 0130 pulse oximeter was alarming.  Patient was found sitting upright with pulse oximetry probe and foley catheter removed.  Patient disoriented, vitals taken found to be WNL, will continue to monitor.

## 2013-07-31 NOTE — Evaluation (Signed)
Physical Therapy Evaluation Patient Details Name: Beverly GraffRuth D Lizarraga MRN: [id number] DOB: 11/25/1923 Today's Date: 07/31/2013 Time: [id number] PT Time Calculation (min): 31 min  PT Assessment / Plan / Recommendation History of Present Illness  s/p L IM nail after fx from falling at home  Clinical Impression  Patient is s/p L IM nail surgery resulting in functional limitations due to the deficits listed below (see PT Problem List).  Patient will benefit from skilled PT to increase their independence and safety with mobility to allow discharge to the venue listed below.  Pt able to tolerate short distance ambulation and exercises today.  Pt educated on PWB status and given exercises handout with ankle pumps, quad sets and glut sets circled x10 for pt to perform as able as "homework."  Pt plans to d/c to SNF.    PT Assessment  Patient needs continued PT services    Follow Up Recommendations  SNF    Does the patient have the potential to tolerate intense rehabilitation      Barriers to Discharge        Equipment Recommendations  Rolling walker with 5" wheels    Recommendations for Other Services     Frequency Min 3X/week    Precautions / Restrictions Precautions Precautions: Fall Restrictions Weight Bearing Restrictions: Yes LLE Weight Bearing: Partial weight bearing LLE Partial Weight Bearing Percentage or Pounds: 50   Pertinent Vitals/Pain Pt reports no pain at rest, mod pain in L hip with activity, ice pack applied, repositioned      Mobility  Bed Mobility Overal bed mobility: Needs Assistance Bed Mobility: Supine to Sit Supine to sit: Min assist;HOB elevated General bed mobility comments: verbal cues for technique, assist for L LE Transfers Overall transfer level: Needs assistance Equipment used: Rolling walker (2 wheeled) Transfers: Sit to/from Stand Sit to Stand: Min assist General transfer comment: verbal cues for technique Ambulation/Gait Ambulation/Gait assistance:  Min assist Ambulation Distance (Feet): 20 Feet Assistive device: Rolling walker (2 wheeled) Gait Pattern/deviations: Step-to pattern;Antalgic General Gait Details: verbal cues for sequence, step length, RW distance, PWB    Exercises General Exercises - Lower Extremity Ankle Circles/Pumps: AROM;Both;15 reps Quad Sets: AROM;Both;15 reps Gluteal Sets: AROM;Both;10 reps Short Arc Quad: AROM;Left;10 reps Heel Slides: AAROM;Left;10 reps Hip ABduction/ADduction: AAROM;Left;10 reps   PT Diagnosis: Difficulty walking;Acute pain  PT Problem List: Decreased strength;Decreased activity tolerance;Decreased mobility;Decreased knowledge of use of DME;Decreased knowledge of precautions;Pain PT Treatment Interventions: DME instruction;Gait training;Functional mobility training;Patient/family education;Therapeutic activities;Therapeutic exercise     PT Goals(Current goals can be found in the care plan section) Acute Rehab PT Goals PT Goal Formulation: With patient Time For Goal Achievement: 08/07/13 Potential to Achieve Goals: Good  Visit Information  Last PT Received On: 07/31/13 Assistance Needed: +1 History of Present Illness: s/p L IM nail after fx from falling at home       Prior Functioning  Home Living Family/patient expects to be discharged to:: Skilled nursing facility Living Arrangements: Alone Prior Function Level of Independence: Independent with assistive device(s) Comments: pt reports using SPC in community and furniture walking around home Communication Communication: No difficulties    Cognition  Cognition Arousal/Alertness: Awake/alert Behavior During Therapy: WFL for tasks assessed/performed Overall Cognitive Status: Within Functional Limits for tasks assessed    Extremity/Trunk Assessment Lower Extremity Assessment Lower Extremity Assessment: LLE deficits/detail LLE Deficits / Details: functional hip weakness observed however able to move against gravity   Balance     End of Session PT - End of Session Activity  Tolerance: Patient limited by pain Patient left: in chair;with call bell/phone within reach Nurse Communication: Mobility status (nsg tech aware of pt in recliner)  GP     Johncarlo Maalouf,KATHrine E 07/31/2013, 10:20 AM Zenovia Jarred, PT, DPT 07/31/2013 Pager: [phone]

## 2013-08-01 LAB — CBC
HCT: 25.6 % — ABNORMAL LOW (ref 36.0–46.0)
Hemoglobin: 8.7 g/dL — ABNORMAL LOW (ref 12.0–15.0)
MCH: 31.3 pg (ref 26.0–34.0)
MCHC: 34 g/dL (ref 30.0–36.0)
MCV: 92.1 fL (ref 78.0–100.0)
PLATELETS: 162 10*3/uL (ref 150–400)
RBC: 2.78 MIL/uL — ABNORMAL LOW (ref 3.87–5.11)
RDW: 13.6 % (ref 11.5–15.5)
WBC: 14 10*3/uL — ABNORMAL HIGH (ref 4.0–10.5)

## 2013-08-01 LAB — BASIC METABOLIC PANEL
BUN: 22 mg/dL (ref 6–23)
CALCIUM: 8.6 mg/dL (ref 8.4–10.5)
CHLORIDE: 104 meq/L (ref 96–112)
CO2: 26 mEq/L (ref 19–32)
CREATININE: 0.84 mg/dL (ref 0.50–1.10)
GFR calc non Af Amer: 61 mL/min — ABNORMAL LOW (ref >90)
GFR, EST AFRICAN AMERICAN: 70 mL/min — AB (ref >90)
Glucose, Bld: 122 mg/dL — ABNORMAL HIGH (ref 70–99)
Potassium: 4.1 mEq/L (ref 3.7–5.3)
Sodium: 141 mEq/L (ref 137–147)

## 2013-08-01 LAB — GLUCOSE, CAPILLARY
GLUCOSE-CAPILLARY: 120 mg/dL — AB (ref 70–99)
Glucose-Capillary: 113 mg/dL — ABNORMAL HIGH (ref 70–99)
Glucose-Capillary: 129 mg/dL — ABNORMAL HIGH (ref 70–99)
Glucose-Capillary: 114 mg/dL — ABNORMAL HIGH (ref 70–99)

## 2013-08-01 MED ORDER — POLYETHYLENE GLYCOL 3350 17 G PO PACK
17.0000 g | PACK | Freq: Two times a day (BID) | ORAL | Status: DC
  Administered 2013-08-01 – 2013-08-02 (×3): 17 g via ORAL

## 2013-08-01 NOTE — Progress Notes (Signed)
[facility] PROGRESS NOTE  Beverly Barnett AVW:[id number] DOB: 07/30/1923 DOA: 07/29/2013 PCP: Oneal GroutPANDEY, MAHIMA, MD  Assessment/Plan: Intertrochanteric fracture of left hip ;  Patient S/P Left Intramedullary Nail Intertrochanteric 2-20. Patient tolerated procedure well.  Aspirin for DVT prophylax.   Constipation;  Docusate BID, Miralax changed to BID.   Delirium; was confuse last night and early this morning. Patient alert, oriented to place and situation. Avoid high dose opioid.   History of normocytic Anemia // Acute Blood loss Anemia; Post surgery expected.  -Likely chronic disease with acute blood loss post surgery.  -Hb  Stable at 8.7.   Mitral Valve Regurgitation:  Stable by ECHO. Asymptomatic. Needs to follow up with primary cardiologist. Family aware.   Leukocytosis  -Possibly reactive. WBC increase to 14 this am. No symptoms or evidence of infection.  -Repeat CBC in am.  -UA negative. Chest x ray ; No acute cardiopulmonary abnormality.  Hypertension  -Hold Norvasc, cozaar. SBP in the 110 range.   Vascular dementia  -Continue outpatient medications.   Code Status: full Code.  Family Communication: care discussed with patient.  Disposition Plan: to be determine.    Consultants:  Dr Charlann Boxerlin.   Procedures: ECHO: Normal LV function; basal septal hypertrophy and chordal SAM; elevated LVOT velocity; moderate to severe MR;   Antibiotics:  none  HPI/Subjective: Denies chest pain or dyspnea at rest and on exertion. No abdominal pain. No Bowel movement.   Objective: Filed Vitals:   08/01/13 0815  BP: 126/67  Pulse: 70  Temp: 98.1 F (36.7 C)  Resp: 16    Intake/Output Summary (Last 24 hours) at 08/01/13 1217 Last data filed at 08/01/13 [id number]  Gross per 24 hour  Intake    720 ml  Output   1350 ml  Net   -630 ml   Filed Weights   07/29/13 1730  Weight: 53.978 kg (119 lb)    Exam:   General:  No distress.   Cardiovascular: S 1, S 2 continues  murmur, louder mitral valve area.   Respiratory: CTA  Abdomen: BS present, soft, NT  Musculoskeletal: no edema.   Data Reviewed: Basic Metabolic Panel:  Recent Labs Lab 07/29/13 1905 07/30/13 0422 07/31/13 0800 08/01/13 0537  NA 139 141 139 141  K 4.1 4.0 3.9 4.1  CL 101 105 103 104  CO2 25 25 26 26   GLUCOSE 131* 127* 131* 122*  BUN 19 24* 16 22  CREATININE 0.80 1.01 0.76 0.84  CALCIUM 9.4 8.7 8.2* 8.6   Liver Function Tests: No results found for this basename: AST, ALT, ALKPHOS, BILITOT, PROT, ALBUMIN,  in the last 168 hours No results found for this basename: LIPASE, AMYLASE,  in the last 168 hours No results found for this basename: AMMONIA,  in the last 168 hours CBC:  Recent Labs Lab 07/29/13 1905 07/30/13 0422 07/31/13 0800 08/01/13 0537  WBC 13.3* 9.8 10.6* 14.0*  HGB 11.7* 9.7* 8.0* 8.7*  HCT 35.7* 29.5* 23.6* 25.6*  MCV 91.8 91.9 91.8 92.1  PLT 198 169 145* 162   Cardiac Enzymes: No results found for this basename: CKTOTAL, CKMB, CKMBINDEX, TROPONINI,  in the last 168 hours BNP (last 3 results) No results found for this basename: PROBNP,  in the last 8760 hours CBG:  Recent Labs Lab 07/31/13 1215 07/31/13 1731 07/31/13 2149 08/01/13 0720 08/01/13 1128  GLUCAP 121* 137* 141* 120* 113*    Recent Results (from the past 240 hour(s))  SURGICAL PCR SCREEN     Status:  None   Collection Time    08-09-13  3:28 AM      Result Value Ref Range Status   MRSA, PCR NEGATIVE  NEGATIVE Final   Staphylococcus aureus NEGATIVE  NEGATIVE Final   Comment:            The Xpert SA Assay (FDA     approved for NASAL specimens     in patients over 63 years of age),     is one component of     a comprehensive surveillance     program.  Test performance has     been validated by [employer] for patients greater     than or equal to 53 year old.     It is not intended     to diagnose infection nor to     guide or monitor treatment.     Studies: Dg Hip  Operative Left  08/09/2013   CLINICAL DATA:  Left femoral intramedullary nail placement.  EXAM: OPERATIVE LEFT HIP  COMPARISON:  Left hip radiographs performed 07/29/2013  FINDINGS: Three fluoroscopic C-arm images demonstrate successful placement of an intramedullary nail within the proximal left femur, transfixing the intertrochanteric fracture in grossly anatomic alignment. A mildly displaced lesser trochanteric fragment is again seen.  The left femoral head remains seated at the acetabulum. No new fractures are identified.  IMPRESSION: Successful internal fixation of left femoral intertrochanteric fracture in grossly anatomic alignment. Mildly displaced lesser trochanteric fragment again seen.   Electronically Signed   By: Roanna Raider M.D.   On: August 09, 2013 22:02   Dg Pelvis Portable  09-Aug-2013   CLINICAL DATA:  Status post internal fixation of left femoral intertrochanteric fracture.  EXAM: PORTABLE PELVIS 1-2 VIEWS  COMPARISON:  Left hip radiographs performed 07/29/2013  FINDINGS: There has been successful placement of an intramedullary nail within the proximal left femur, transfixing the patient's intertrochanteric fracture in grossly anatomic alignment. A mildly displaced lesser trochanteric fragment is again seen.  The patient's right-sided femoral hardware is grossly unchanged in appearance; no new fractures are seen. Both femoral heads remain within their respective acetabula. Scattered postoperative soft tissue air is noted overlying the left hip.  IMPRESSION: Successful internal fixation of left-sided intertrochanteric fracture, in grossly anatomic alignment. Mildly displaced lesser trochanteric fragment again seen. No new fracture identified.   Electronically Signed   By: Roanna Raider M.D.   On: 08-09-2013 21:59    Scheduled Meds: . aspirin EC  325 mg Oral BID  . docusate sodium  100 mg Oral BID  . donepezil  5 mg Oral QHS  . ferrous sulfate  325 mg Oral TID PC  . insulin aspart  0-5  Units Subcutaneous QHS  . insulin aspart  0-9 Units Subcutaneous TID WC  . metoprolol  50 mg Oral BID  . multivitamin with minerals  1 tablet Oral BID  . polyethylene glycol  17 g Oral Daily  . simvastatin  20 mg Oral QPC supper   Continuous Infusions:    Active Problems:   Hypertension   Vascular dementia   Essential hypertension, benign   Intertrochanteric fracture of left hip   Anemia   Hip fracture, left    Time spent: 30 minutes.     Mahmud Keithly  [facility] Pager [phone]. If 7PM-7AM, please contact night-coverage at [email or web address], password [facility] 08/01/2013, 12:17 PM  LOS: 3 days

## 2013-08-01 NOTE — Plan of Care (Signed)
Problem: Phase III Progression Outcomes Goal: Anticoagulant follow-up in place Outcome: Not Applicable Date Met:  15/48/84 On ASA

## 2013-08-01 NOTE — Progress Notes (Signed)
Subjective: 2 Days Post-Op Procedure(s) (LRB): INTRAMEDULLARY (IM) NAIL INTERTROCHANTRIC LEFT (Left) Patient doing very well this AM. Pain well controlled, able to ambulate to bathroom and around room.  Pt denies N/V/F/C, chest pain, SOB, calf pain, and paresthesias b/l.   Objective: Vital signs in last 24 hours: Temp:  [98.3 F (36.8 C)-99.1 F (37.3 C)] 99.1 F (37.3 C) (02/22 0500) Pulse Rate:  [71-81] 71 (02/22 0500) Resp:  [14-16] 16 (02/22 0500) BP: (93-150)/(51-67) 117/64 mmHg (02/22 0500) SpO2:  [92 %-98 %] 92 % (02/22 0500)  Intake/Output from previous day: 02/21 0701 - 02/22 0700 In: 720 [P.O.:720] Out: 1150 [Urine:1150] Intake/Output this shift: Total I/O In: 240 [P.O.:240] Out: 200 [Urine:200]   Recent Labs  07/29/13 1905 07/30/13 0422 07/31/13 0800 08/01/13 0537  HGB 11.7* 9.7* 8.0* 8.7*    Recent Labs  07/31/13 0800 08/01/13 0537  WBC 10.6* 14.0*  RBC 2.57* 2.78*  HCT 23.6* 25.6*  PLT 145* 162    Recent Labs  07/31/13 0800 08/01/13 0537  NA 139 141  K 3.9 4.1  CL 103 104  CO2 26 26  BUN 16 22  CREATININE 0.76 0.84  GLUCOSE 131* 122*  CALCIUM 8.2* 8.6    Recent Labs  07/29/13 1905  INR 1.06    Incision C/D/I, well approximated with no dehiscence noted.  No erythema/drainage noted.  Normal amounts of post op edema.  Compartments soft, negative Homan's sign.  Lower extremtities are NVI with good motor control of plantar/dorsiflexion of great toes and feet b/l.  No evidence of infection noted.  Assessment/Plan: 2 Days Post-Op Procedure(s) (LRB): INTRAMEDULLARY (IM) NAIL INTERTROCHANTRIC LEFT (Left) Continue physical therapy.  Continue medical management as dictated by Dr. Carmell Austriaegaldo.  Continue Aspirin for anticoagulation.  F/u with Dr. Shelle IronBeane in 2 weeks.  FLOWERS, CHRISTOPHER S 08/01/2013, 10:28 AM

## 2013-08-02 ENCOUNTER — Encounter (HOSPITAL_COMMUNITY): Payer: Self-pay | Admitting: Orthopedic Surgery

## 2013-08-02 DIAGNOSIS — D649 Anemia, unspecified: Secondary | ICD-10-CM

## 2013-08-02 DIAGNOSIS — E7800 Pure hypercholesterolemia, unspecified: Secondary | ICD-10-CM

## 2013-08-02 DIAGNOSIS — F0390 Unspecified dementia without behavioral disturbance: Secondary | ICD-10-CM

## 2013-08-02 DIAGNOSIS — I10 Essential (primary) hypertension: Secondary | ICD-10-CM

## 2013-08-02 LAB — BASIC METABOLIC PANEL
BUN: 21 mg/dL (ref 6–23)
CALCIUM: 8.3 mg/dL — AB (ref 8.4–10.5)
CO2: 24 mEq/L (ref 19–32)
CREATININE: 0.73 mg/dL (ref 0.50–1.10)
Chloride: 107 mEq/L (ref 96–112)
GFR calc Af Amer: 87 mL/min — ABNORMAL LOW (ref >90)
GFR calc non Af Amer: 75 mL/min — ABNORMAL LOW (ref >90)
GLUCOSE: 120 mg/dL — AB (ref 70–99)
Potassium: 4.1 mEq/L (ref 3.7–5.3)
Sodium: 141 mEq/L (ref 137–147)

## 2013-08-02 LAB — CBC
HCT: 22.7 % — ABNORMAL LOW (ref 36.0–46.0)
HEMOGLOBIN: 7.5 g/dL — AB (ref 12.0–15.0)
MCH: 30.5 pg (ref 26.0–34.0)
MCHC: 33 g/dL (ref 30.0–36.0)
MCV: 92.3 fL (ref 78.0–100.0)
Platelets: 148 10*3/uL — ABNORMAL LOW (ref 150–400)
RBC: 2.46 MIL/uL — ABNORMAL LOW (ref 3.87–5.11)
RDW: 13.5 % (ref 11.5–15.5)
WBC: 9.5 10*3/uL (ref 4.0–10.5)

## 2013-08-02 LAB — GLUCOSE, CAPILLARY
GLUCOSE-CAPILLARY: 106 mg/dL — AB (ref 70–99)
GLUCOSE-CAPILLARY: 108 mg/dL — AB (ref 70–99)
GLUCOSE-CAPILLARY: 125 mg/dL — AB (ref 70–99)
Glucose-Capillary: 117 mg/dL — ABNORMAL HIGH (ref 70–99)

## 2013-08-02 LAB — PREPARE RBC (CROSSMATCH)

## 2013-08-02 MED ORDER — LOSARTAN POTASSIUM 50 MG PO TABS
50.0000 mg | ORAL_TABLET | Freq: Every day | ORAL | Status: DC
  Administered 2013-08-02 – 2013-08-03 (×2): 50 mg via ORAL
  Filled 2013-08-02 (×2): qty 1

## 2013-08-02 MED ORDER — FUROSEMIDE 10 MG/ML IJ SOLN
20.0000 mg | Freq: Once | INTRAMUSCULAR | Status: AC
  Administered 2013-08-02: 20 mg via INTRAVENOUS
  Filled 2013-08-02: qty 2

## 2013-08-02 NOTE — Progress Notes (Signed)
Patient ID: Beverly Barnett, female   DOB: 01/17/1924, 80 y.o.   MRN: [id number] Subjective: 3 Days Post-Op Procedure(s) (LRB): INTRAMEDULLARY (IM) NAIL INTERTROCHANTRIC LEFT (Left)    Patient reports pain as mild.  Slow progress with therapy.  Apparently receiving blood today per medical team.  Objective:   VITALS:   Filed Vitals:   08/02/13 1450  BP: 131/67  Pulse: 85  Temp: 98.7 F (37.1 C)  Resp: 16    Neurovascular intact Incision: dressing C/D/I Family in room  LABS  Recent Labs  07/31/13 0800 08/01/13 0537 08/02/13 0400  HGB 8.0* 8.7* 7.5*  HCT 23.6* 25.6* 22.7*  WBC 10.6* 14.0* 9.5  PLT 145* 162 148*     Recent Labs  07/31/13 0800 08/01/13 0537 08/02/13 0400  NA 139 141 141  K 3.9 4.1 4.1  BUN 16 22 21   CREATININE 0.76 0.84 0.73  GLUCOSE 131* 122* 120*    No results found for this basename: LABPT, INR,  in the last 72 hours   Assessment/Plan: 3 Days Post-Op Procedure(s) (LRB): INTRAMEDULLARY (IM) NAIL INTERTROCHANTRIC LEFT (Left)   Advance diet Up with therapy Discharge to SNF when medically stable Follow up in 2 weeks DVT prophylaxis Partial weight bearing

## 2013-08-02 NOTE — Progress Notes (Signed)
Physical Therapy Treatment Patient Details Name: Beverly GraffRuth D Barnett MRN: [id number] DOB: 10/28/1923 Today's Date: 08/02/2013 Time: [id number] PT Time Calculation (min): 25 min  PT Assessment / Plan / Recommendation  History of Present Illness s/p L IM nail after fx from falling at home   PT Comments   Pt progressing; sats 98% on RA, HR 92  Follow Up Recommendations  SNF     Does the patient have the potential to tolerate intense rehabilitation     Barriers to Discharge        Equipment Recommendations  Rolling walker with 5" wheels    Recommendations for Other Services    Frequency Min 3X/week   Progress towards PT Goals Progress towards PT goals: Progressing toward goals  Plan Current plan remains appropriate    Precautions / Restrictions Restrictions Weight Bearing Restrictions: Yes LLE Weight Bearing: Partial weight bearing LLE Partial Weight Bearing Percentage or Pounds: 50   Pertinent Vitals/Pain No c/o pain,  Ice to left hip    Mobility  Transfers Overall transfer level: Needs assistance Equipment used: Rolling walker (2 wheeled) Transfers: Sit to/from Stand Sit to Stand: Min guard General transfer comment: verbal cues for technique Ambulation/Gait Ambulation/Gait assistance: Min guard Ambulation Distance (Feet): 56 Feet Assistive device: Rolling walker (2 wheeled) Gait Pattern/deviations: Step-to pattern General Gait Details: verbal cues for sequence, step length, RW distance, PWB    Exercises General Exercises - Lower Extremity Ankle Circles/Pumps: AROM;Both;15 reps Quad Sets: AROM;Strengthening;Both;10 reps Heel Slides: AAROM;Left;10 reps Hip ABduction/ADduction: AAROM;Left;10 reps   PT Diagnosis:    PT Problem List:   PT Treatment Interventions:     PT Goals (current goals can now be found in the care plan section) Acute Rehab PT Goals Time For Goal Achievement: 08/07/13 Potential to Achieve Goals: Good  Visit Information  Last PT Received On:  08/02/13 Assistance Needed: +1 History of Present Illness: s/p L IM nail after fx from falling at home    Subjective Data      Cognition  Cognition Arousal/Alertness: Awake/alert Behavior During Therapy: WFL for tasks assessed/performed Overall Cognitive Status: Within Functional Limits for tasks assessed    Balance     End of Session PT - End of Session Equipment Utilized During Treatment: Gait belt Activity Tolerance: Patient tolerated treatment well Patient left: in bed;with call bell/phone within reach;with family/visitor present Nurse Communication: Mobility status   GP     [facility] CenterWILLIAMS,Beverly Barnett 08/02/2013, 10:49 AM

## 2013-08-02 NOTE — Progress Notes (Signed)
UR completed. Anterrio Mccleery RN CCM Case Mgmt phone [phone] 

## 2013-08-02 NOTE — Progress Notes (Signed)
[facility] PROGRESS NOTE  Beverly Barnett ZOX:[id number] DOB: May 13, 1924 DOA: 07/29/2013 PCP: Oneal Grout, MD  Assessment/Plan: Intertrochanteric fracture of left hip:  Patient S/P Left Intramedullary Nail Intertrochanteric 2-20. Patient tolerated procedure well.  Aspirin for DVT prophylax.   Anemia; Acute blood loss; post surgery expected. Will transfuse 1 unit of PRBC. IV lasix after transfusin. Hb in am.   Constipation;  Docusate BID, Miralax changed to BID.   Delirium; was confuse last night and early this morning. Patient alert, oriented to place and situation. Avoid high dose opioid.   History of normocytic Anemia // Acute Blood loss Anemia; Post surgery expected.  -Likely chronic disease with acute blood loss post surgery.  -Hb  Stable at 8.7.   Mitral Valve Regurgitation:  Stable by ECHO. Asymptomatic. Needs to follow up with primary cardiologist. Family aware.   Leukocytosis  -Possibly reactive. WBC increase to 14 this am. No symptoms or evidence of infection.  -CBC normalized.  -UA negative. Chest x ray ; No acute cardiopulmonary abnormality.  Hypertension  -Hold Norvasc. Resume Cozaar.   Vascular dementia  -Continue outpatient medications.   Code Status: full Code.  Family Communication: care discussed with patient son at bedside.  Disposition Plan: SNF tomorrow if hb stable.    Consultants:  Dr Charlann Boxer.   Procedures: ECHO: Normal LV function; basal septal hypertrophy and chordal SAM; elevated LVOT velocity; moderate to severe MR;   Antibiotics:  none  HPI/Subjective: Denies chest pain or dyspnea at rest and on exertion. No abdominal pain. No Bowel movement.   Objective: Filed Vitals:   08/02/13 0626  BP: 152/68  Pulse: 79  Temp: 98.5 F (36.9 C)  Resp: 16    Intake/Output Summary (Last 24 hours) at 08/02/13 1159 Last data filed at 08/02/13 0828  Gross per 24 hour  Intake    720 ml  Output   1000 ml  Net   -280 ml   Filed Weights    07/29/13 1730  Weight: 53.978 kg (119 lb)    Exam:   General:  No distress.   Cardiovascular: S 1, S 2 continues murmur, louder mitral valve area.   Respiratory: CTA  Abdomen: BS present, soft, NT  Musculoskeletal: no edema.   Data Reviewed: Basic Metabolic Panel:  Recent Labs Lab 07/29/13 1905 07/30/13 0422 07/31/13 0800 08/01/13 0537 08/02/13 0400  NA 139 141 139 141 141  K 4.1 4.0 3.9 4.1 4.1  CL 101 105 103 104 107  CO2 25 25 26 26 24   GLUCOSE 131* 127* 131* 122* 120*  BUN 19 24* 16 22 21   CREATININE 0.80 1.01 0.76 0.84 0.73  CALCIUM 9.4 8.7 8.2* 8.6 8.3*   Liver Function Tests: No results found for this basename: AST, ALT, ALKPHOS, BILITOT, PROT, ALBUMIN,  in the last 168 hours No results found for this basename: LIPASE, AMYLASE,  in the last 168 hours No results found for this basename: AMMONIA,  in the last 168 hours CBC:  Recent Labs Lab 07/29/13 1905 07/30/13 0422 07/31/13 0800 08/01/13 0537 08/02/13 0400  WBC 13.3* 9.8 10.6* 14.0* 9.5  HGB 11.7* 9.7* 8.0* 8.7* 7.5*  HCT 35.7* 29.5* 23.6* 25.6* 22.7*  MCV 91.8 91.9 91.8 92.1 92.3  PLT 198 169 145* 162 148*   Cardiac Enzymes: No results found for this basename: CKTOTAL, CKMB, CKMBINDEX, TROPONINI,  in the last 168 hours BNP (last 3 results) No results found for this basename: PROBNP,  in the last 8760 hours CBG:  Recent Labs  Lab 08/01/13 1128 08/01/13 1655 08/01/13 2202 08/02/13 0716 08/02/13 1116  GLUCAP 113* 129* 114* 106* 108*    Recent Results (from the past 240 hour(s))  SURGICAL PCR SCREEN     Status: None   Collection Time    07/30/13  3:28 AM      Result Value Ref Range Status   MRSA, PCR NEGATIVE  NEGATIVE Final   Staphylococcus aureus NEGATIVE  NEGATIVE Final   Comment:            The Xpert SA Assay (FDA     approved for NASAL specimens     in patients over 78 years of age),     is one component of     a comprehensive surveillance     program.  Test performance  has     been validated by [facility] for patients greater     than or equal to 78 year old.     It is not intended     to diagnose infection nor to     guide or monitor treatment.     Studies: No results found.  Scheduled Meds: . aspirin EC  325 mg Oral BID  . docusate sodium  100 mg Oral BID  . donepezil  5 mg Oral QHS  . ferrous sulfate  325 mg Oral TID PC  . furosemide  20 mg Intravenous Once  . insulin aspart  0-5 Units Subcutaneous QHS  . insulin aspart  0-9 Units Subcutaneous TID WC  . metoprolol  50 mg Oral BID  . multivitamin with minerals  1 tablet Oral BID  . polyethylene glycol  17 g Oral BID  . simvastatin  20 mg Oral QPC supper   Continuous Infusions:    Active Problems:   Hypertension   Vascular dementia   Essential hypertension, benign   Intertrochanteric fracture of left hip   Anemia   Hip fracture, left    Time spent: 30 minutes.     Adelyne Marchese  [facility] Pager [phone]. If 7PM-7AM, please contact night-coverage at [email or web address], password [email or web address] 08/02/2013, 11:59 AM  LOS: 4 days

## 2013-08-03 LAB — GLUCOSE, CAPILLARY: Glucose-Capillary: 99 mg/dL (ref 70–99)

## 2013-08-03 LAB — HEMOGLOBIN AND HEMATOCRIT, BLOOD
HEMATOCRIT: 26.5 % — AB (ref 36.0–46.0)
Hemoglobin: 9.1 g/dL — ABNORMAL LOW (ref 12.0–15.0)

## 2013-08-03 LAB — TYPE AND SCREEN
ABO/RH(D): A POS
ANTIBODY SCREEN: NEGATIVE
Unit division: 0

## 2013-08-03 MED ORDER — FERROUS SULFATE 325 (65 FE) MG PO TABS: 325.0000 mg | ORAL_TABLET | Freq: Three times a day (TID) | ORAL | Status: DC

## 2013-08-03 MED ORDER — AMLODIPINE BESYLATE 10 MG PO TABS
10.0000 mg | ORAL_TABLET | Freq: Every day | ORAL | Status: DC
  Administered 2013-08-03: 10 mg via ORAL
  Filled 2013-08-03: qty 1

## 2013-08-03 MED ORDER — DSS 100 MG PO CAPS: 100.0000 mg | ORAL_CAPSULE | Freq: Two times a day (BID) | ORAL | Status: DC

## 2013-08-03 NOTE — Discharge Summary (Signed)
Physician Discharge Summary  Beverly Barnett:[id number] DOB: 12/05/1923 DOA: 07/29/2013  PCP: Oneal Grout, MD  Admit date: 07/29/2013 Discharge date: 08/03/2013  Time spent: 35 minutes  Recommendations for Outpatient Follow-up:  1. Needs to follow up with Dr Charlann Boxer in 2 weeks.  2. Need cbc to follow HB.  3. Need to follow up with [facility] cardiology for mitral valve regurgitation.   Discharge Diagnoses:    Hip fracture, left   Acute blood loss; post surgery expected   Post surgery Delirium.    Hypertension   Vascular dementia   Essential hypertension, benign   Intertrochanteric fracture of left hip   Anemia     Discharge Condition: Stable.   Diet recommendation: Heart Healthy  Filed Weights   07/29/13 1730  Weight: 53.978 kg (119 lb)    History of present illness:  Beverly Barnett is a 80 y.o. female with history of hypertension osteoporosis, vascular dementia who presents with above complaints. She states that while in the kitchen today she sustained a mechanical fall and began having pain in her left hip. She reports that she had had a right hip fracture or years ago and had surgery done by Dr. Jillyn Hidden and so he went to the office and x-rays today showed a comminuted left femur intertrochanteric fracture. She was directly admitted to the hospital/TRH service for further evaluation and management. She denies dizziness, chest pain, shortness of breath, cough, syncope, melena and no hematochezia.   Hospital Course:  Intertrochanteric fracture of left hip:  Patient S/P Left Intramedullary Nail Intertrochanteric 2-20.  Patient tolerated procedure well.  Aspirin for DVT prophylax.   Anemia; Acute blood loss; post surgery expected. S/P 1 unit of PRBC transfusion on 2-23. IV lasix after transfusin. Hb post transfusion at 9. Discharge on ferrous sulfate. Need repeat CBC.   Constipation;  Docusate BID. Had BM.   Delirium; was confuse post surgery. Patient alert, oriented to place and  situation. Avoid high dose opioid.   History of normocytic Anemia // Acute Blood loss Anemia; Post surgery expected.  -Likely chronic disease with acute blood loss post surgery.    Mitral Valve Regurgitation:  Stable by ECHO. Asymptomatic. Needs to follow up with primary cardiologist. Family aware.  Leukocytosis  -Possibly reactive. WBC increase to 14 this am. No symptoms or evidence of infection.  -CBC normalized.  -UA negative. Chest x ray ; No acute cardiopulmonary abnormality.  Hypertension  - Resume Cozaar and Norvasc.  Vascular dementia  -Continue outpatient medications.   Procedures: ECHO: Normal LV function; basal septal hypertrophy and chordal SAM; elevated LVOT velocity; moderate to severe MR;   Consultations: Dr Charlann Boxer.    Discharge Exam: Filed Vitals:   08/03/13 0654  BP: 190/65  Pulse: 68  Temp: 98.5 F (36.9 C)  Resp: 16    General: No distress.  Cardiovascular: S 1, S 2 RRR Respiratory: CTA  Discharge Instructions  Discharge Orders   Future Appointments Provider Department Dept Phone   10/19/2013 9:45 AM Oneal Grout, MD [facility] [phone]   Future Orders Complete By Expires   Call MD / Call 911  As directed    Comments:     If you experience chest pain or shortness of breath, CALL 911 and be transported to the hospital emergency room.  If you develope a fever above 101 F, pus (white drainage) or increased drainage or redness at the wound, or calf pain, call your surgeon's office.   Constipation Prevention  As directed  Comments:     Drink plenty of fluids.  Prune juice may be helpful.  You may use a stool softener, such as Colace (over the counter) 100 mg twice a day.  Use MiraLax (over the counter) for constipation as needed.   Diet - low sodium heart healthy  As directed    Diet - low sodium heart healthy  As directed    Discharge instructions  As directed    Comments:     Maintain surgical dressing for 10-14 days, or until  follow up in the clinic. Follow up in 2 weeks at [facility]. Call with any questions or concerns.   Increase activity slowly  As directed    Partial weight bearing  As directed    Questions:     % Body Weight:  50   Laterality:  left   Extremity:  Lower       Medication List         amLODipine 10 MG tablet  Commonly known as:  NORVASC  Take 1 tablet (10 mg total) by mouth daily.     aspirin EC 325 MG tablet  Take 1 tablet (325 mg total) by mouth 2 (two) times daily. Take for 4 weeks.     Co Q 10 100 MG Caps  Take 1 tablet by mouth 2 (two) times daily.     donepezil 5 MG tablet  Commonly known as:  ARICEPT  Take 1 tablet (5 mg total) by mouth at bedtime.     DSS 100 MG Caps  Take 100 mg by mouth 2 (two) times daily.     ferrous sulfate 325 (65 FE) MG tablet  Take 1 tablet (325 mg total) by mouth 3 (three) times daily after meals.     HYDROcodone-acetaminophen 5-325 MG per tablet  Commonly known as:  NORCO/VICODIN  Take 1-2 tablets by mouth every 6 (six) hours as needed for moderate pain.     losartan 50 MG tablet  Commonly known as:  COZAAR  Take 50 mg by mouth daily.     metoprolol 50 MG tablet  Commonly known as:  LOPRESSOR  Take 1 tablet (50 mg total) by mouth 2 (two) times daily.     multivitamin with minerals Tabs tablet  Take 1 tablet by mouth daily. 2 in am, 1 in pm     simvastatin 40 MG tablet  Commonly known as:  ZOCOR  Take 40 mg by mouth daily.       No Known Allergies     Follow-up Information   Follow up with Shelda Pal, MD. Schedule an appointment as soon as possible for a visit in 2 weeks.   Specialty:  Orthopedic Surgery   Contact information:   [address] [place] [address] [phone]       Follow up with Oneal Grout, MD.   Specialty:  Internal Medicine   Contact information:   [address] [place] [address] [phone]        The results of significant diagnostics from  this hospitalization (including imaging, microbiology, ancillary and laboratory) are listed below for reference.    Significant Diagnostic Studies: Dg Hip Operative Left  07/30/2013   CLINICAL DATA:  Left femoral intramedullary nail placement.  EXAM: OPERATIVE LEFT HIP  COMPARISON:  Left hip radiographs performed 07/29/2013  FINDINGS: Three fluoroscopic C-arm images demonstrate successful placement of an intramedullary nail within the proximal left femur, transfixing the intertrochanteric fracture in grossly anatomic alignment. A  mildly displaced lesser trochanteric fragment is again seen.  The left femoral head remains seated at the acetabulum. No new fractures are identified.  IMPRESSION: Successful internal fixation of left femoral intertrochanteric fracture in grossly anatomic alignment. Mildly displaced lesser trochanteric fragment again seen.   Electronically Signed   By: Roanna Raider M.D.   On: 07/30/2013 22:02   Dg Pelvis Portable  07/30/2013   CLINICAL DATA:  Status post internal fixation of left femoral intertrochanteric fracture.  EXAM: PORTABLE PELVIS 1-2 VIEWS  COMPARISON:  Left hip radiographs performed 07/29/2013  FINDINGS: There has been successful placement of an intramedullary nail within the proximal left femur, transfixing the patient's intertrochanteric fracture in grossly anatomic alignment. A mildly displaced lesser trochanteric fragment is again seen.  The patient's right-sided femoral hardware is grossly unchanged in appearance; no new fractures are seen. Both femoral heads remain within their respective acetabula. Scattered postoperative soft tissue air is noted overlying the left hip.  IMPRESSION: Successful internal fixation of left-sided intertrochanteric fracture, in grossly anatomic alignment. Mildly displaced lesser trochanteric fragment again seen. No new fracture identified.   Electronically Signed   By: Roanna Raider M.D.   On: 07/30/2013 21:59   Dg Chest Portable 1  View  07/29/2013   CLINICAL DATA:  80 year old female preoperative study for left hip ORIF. Initial encounter.  EXAM: PORTABLE CHEST - 1 VIEW  COMPARISON:  07/23/2011.  FINDINGS: Portable AP semi upright view at 1854 hr. Mildly lower lung volumes. Normal cardiac size and mediastinal contours. Visualized tracheal air column is within normal limits. Allowing for portable technique, the lungs are clear. Chronic proximal left humerus ORIF hardware.  IMPRESSION: No acute cardiopulmonary abnormality.   Electronically Signed   By: Augusto Gamble M.D.   On: 07/29/2013 19:15   Dg Hip Portable 1 View Left  07/29/2013   CLINICAL DATA:  80 year old female preoperative study for left hip fixation. Initial encounter.  EXAM: PORTABLE LEFT HIP - 1 VIEW  COMPARISON:  None.  FINDINGS: Portable AP view at 1857 hr demonstrating comminuted intertrochanteric fracture. Distal fragment minimally impacted. Lesser trochanter fragment mildly retracted. There appears to be a longitudinal component of the fracture extending into the left femoral neck. Left femoral head appears to remain normally located. Visible left hemipelvis intact. Calcified atherosclerosis.  IMPRESSION: Comminuted left femur intertrochanteric fracture.   Electronically Signed   By: Augusto Gamble M.D.   On: 07/29/2013 19:14    Microbiology: Recent Results (from the past 240 hour(s))  SURGICAL PCR SCREEN     Status: None   Collection Time    07/30/13  3:28 AM      Result Value Ref Range Status   MRSA, PCR NEGATIVE  NEGATIVE Final   Staphylococcus aureus NEGATIVE  NEGATIVE Final   Comment:            The Xpert SA Assay (FDA     approved for NASAL specimens     in patients over 53 years of age),     is one component of     a comprehensive surveillance     program.  Test performance has     been validated by [employer] for patients greater     than or equal to 23 year old.     It is not intended     to diagnose infection nor to     guide or monitor  treatment.     Labs: Basic Metabolic Panel:  Recent Labs Lab  07/29/13 1905 07/30/13 0422 07/31/13 0800 08/01/13 0537 08/02/13 0400  NA 139 141 139 141 141  K 4.1 4.0 3.9 4.1 4.1  CL 101 105 103 104 107  CO2 25 25 26 26 24   GLUCOSE 131* 127* 131* 122* 120*  BUN 19 24* 16 22 21   CREATININE 0.80 1.01 0.76 0.84 0.73  CALCIUM 9.4 8.7 8.2* 8.6 8.3*   Liver Function Tests: No results found for this basename: AST, ALT, ALKPHOS, BILITOT, PROT, ALBUMIN,  in the last 168 hours No results found for this basename: LIPASE, AMYLASE,  in the last 168 hours No results found for this basename: AMMONIA,  in the last 168 hours CBC:  Recent Labs Lab 07/29/13 1905 07/30/13 0422 07/31/13 0800 08/01/13 0537 08/02/13 0400 08/03/13 0410  WBC 13.3* 9.8 10.6* 14.0* 9.5  --   HGB 11.7* 9.7* 8.0* 8.7* 7.5* 9.1*  HCT 35.7* 29.5* 23.6* 25.6* 22.7* 26.5*  MCV 91.8 91.9 91.8 92.1 92.3  --   PLT 198 169 145* 162 148*  --    Cardiac Enzymes: No results found for this basename: CKTOTAL, CKMB, CKMBINDEX, TROPONINI,  in the last 168 hours BNP: BNP (last 3 results) No results found for this basename: PROBNP,  in the last 8760 hours CBG:  Recent Labs Lab 08/02/13 0716 08/02/13 1116 08/02/13 1802 08/02/13 2220 08/03/13 0719  GLUCAP 106* 108* 125* 117* 99       Signed:  REGALADO,BELKYS  [facility] 08/03/2013, 8:45 AM

## 2013-08-03 NOTE — Progress Notes (Signed)
Clinical Social Work Department CLINICAL SOCIAL WORK PLACEMENT NOTE 08/03/2013  Patient:  Beverly GraffRACE,Beverly Barnett  Account Number:  [id number] Admit date:  07/29/2013  Clinical Social Worker:  Cori RazorJAMIE Kaleisha Bhargava, LCSW  Date/time:  07/30/2013 03:31 PM  Clinical Social Work is seeking post-discharge placement for this patient at the following level of care:   SKILLED NURSING   (*CSW will update this form in Epic as items are completed)     Patient/family provided with [facility] Department of Clinical Social Work's list of facilities offering this level of care within the geographic area requested by the patient (or if unable, by the patient's family).  07/30/2013  Patient/family informed of their freedom to choose among providers that offer the needed level of care, that participate in Medicare, Medicaid or managed care program needed by the patient, have an available bed and are willing to accept the patient.    Patient/family informed of [facility]' ownership interest in [facility], as well as of the fact that they are under no obligation to receive care at this facility.  PASARR submitted to EDS on  PASARR number received from EDS on 03/06/2010  FL2 transmitted to all facilities in geographic area requested by pt/family on  07/30/2013 FL2 transmitted to all facilities within larger geographic area on   Patient informed that his/her managed care company has contracts with or will negotiate with  certain facilities, including the following:     Patient/family informed of bed offers received:  08/02/2013 [facility] Physician recommends and patient chooses bed at    [facility] on  08/03/2013 Patient to be transferred to facility by [facility]  The following physician request were entered in Epic:   Additional Comments:   Cori RazorJamie Amiere Cawley LCSW [phone]

## 2013-08-03 NOTE — Progress Notes (Signed)
RN called report Aram Beechamynthia, [facility]. All questions answered.  [facility] transported patient to SNF.

## 2013-09-20 ENCOUNTER — Encounter: Payer: Self-pay | Admitting: *Deleted

## 2013-09-21 ENCOUNTER — Ambulatory Visit: Payer: Medicare Other | Admitting: Internal Medicine

## 2013-09-27 ENCOUNTER — Other Ambulatory Visit: Payer: Self-pay | Admitting: Internal Medicine

## 2013-10-13 ENCOUNTER — Encounter: Payer: Self-pay | Admitting: Internal Medicine

## 2013-10-13 ENCOUNTER — Ambulatory Visit (INDEPENDENT_AMBULATORY_CARE_PROVIDER_SITE_OTHER): Payer: Medicare Other | Admitting: Internal Medicine

## 2013-10-13 VITALS — BP 124/70 | HR 63 | Temp 98.0°F | Wt 117.4 lb

## 2013-10-13 DIAGNOSIS — S72009A Fracture of unspecified part of neck of unspecified femur, initial encounter for closed fracture: Secondary | ICD-10-CM

## 2013-10-13 DIAGNOSIS — E785 Hyperlipidemia, unspecified: Secondary | ICD-10-CM

## 2013-10-13 DIAGNOSIS — I10 Essential (primary) hypertension: Secondary | ICD-10-CM

## 2013-10-13 DIAGNOSIS — S72002A Fracture of unspecified part of neck of left femur, initial encounter for closed fracture: Secondary | ICD-10-CM

## 2013-10-13 DIAGNOSIS — R29898 Other symptoms and signs involving the musculoskeletal system: Secondary | ICD-10-CM

## 2013-10-13 DIAGNOSIS — F0150 Vascular dementia without behavioral disturbance: Secondary | ICD-10-CM

## 2013-10-13 MED ORDER — PHOSPHATIDYLSERINE-DHA-EPA 100-19.5-6.5 MG PO CAPS: 1.0000 | ORAL_CAPSULE | Freq: Every day | ORAL | Status: DC

## 2013-10-13 NOTE — Progress Notes (Signed)
Patient ID: Beverly Barnett, female   DOB: 02/19/1924, 80 y.o.   MRN: [id number]    Chief Complaint  Patient presents with  . Medical Management of Chronic Issues    routine visit with memory issues   No Known Allergies  HPI 80 y/o female pt is here for follow up visit. Her daughter is here with her. She returned from her month trip to [place]. She mentions that she has been complaint with her medication and her bp reading have been good. No values available for review.  She had episode of urinary incontinence and increased frequency with some diarrhea on her trip which has now resolved. No other complaints this visit.  Of note had a fall and left hip fracture and underwent left hip ORIF. She did not complete her home PT and would now like to resume it  Review of Systems   Constitutional: Negative for fever, chills, weight loss and malaise/fatigue.  HENT: Negative for congestion.    Eyes: Negative for blurred vision.   Respiratory: Negative for cough and shortness of breath.    Cardiovascular: Negative for chest pain, palpitations and leg swelling.   Gastrointestinal: Negative for heartburn, nausea, vomiting and abdominal pain.   Genitourinary: Negative for dysuria.   Musculoskeletal: Negative for myalgias.   Skin: Negative for rash.   Neurological: Negative for dizziness, seizures, loss of consciousness and headaches.   Psychiatric/Behavioral: forgetful these day. Last MMSE in 9/14 26/30. Negative for depression. The patient does not have insomnia.    Past Medical History  Diagnosis Date  . Hypertension   . High cholesterol   . Osteoporosis   . Type II or unspecified type diabetes mellitus without mention of complication, not stated as uncontrolled   . Hyperosmolality and/or hypernatremia   . Dysphagia, unspecified(787.20)   . Other atopic dermatitis and related conditions   . Encounter for long-term (current) use of other medications   . Cardiac catheterisation as the cause of  abnormal reaction of patient, or of later complication 10/05/2006   Past Surgical History  Procedure Laterality Date  . Femur surgery Right 03/2010  . Shoulder surgery Left 03/2010  . Femur im nail  07/24/2011    Procedure: INTRAMEDULLARY (IM) NAIL FEMORAL;  Surgeon: Javier DockerJeffrey C Beane, MD;  Location: [facility];  Service: Orthopedics;  Laterality: Right;  Biomet, c-arm, Jackson table  . Intramedullary (im) nail intertrochanteric Left 07/30/2013    Procedure: INTRAMEDULLARY (IM) NAIL INTERTROCHANTRIC LEFT;  Surgeon: Shelda PalMatthew D Olin, MD;  Location: [facility];  Service: Orthopedics;  Laterality: Left;   Current Outpatient Prescriptions on File Prior to Visit  Medication Sig Dispense Refill  . amLODipine (NORVASC) 10 MG tablet Take 1 tablet (10 mg total) by mouth daily.  30 tablet  3  . Coenzyme Q10 (CO Q 10) 100 MG CAPS Take 1 tablet by mouth 2 (two) times daily.      Marland Kitchen. donepezil (ARICEPT) 5 MG tablet Take 1 tablet (5 mg total) by mouth at bedtime.  90 tablet  1  . losartan (COZAAR) 50 MG tablet TAKE ONE TABLET BY MOUTH EVERY DAY FOR BLOOD PRESSURE  90 tablet  1  . metoprolol (LOPRESSOR) 50 MG tablet Take 1 tablet (50 mg total) by mouth 2 (two) times daily.  180 tablet  1  . Multiple Vitamin (MULITIVITAMIN WITH MINERALS) TABS Take 1 tablet by mouth daily. 2 in am, 1 in pm      . simvastatin (ZOCOR) 40 MG tablet Take 40 mg by mouth  daily.       No current facility-administered medications on file prior to visit.    Physical exam BP 124/70  Pulse 63  Temp(Src) 98 F (36.7 C) (Oral)  Wt 117 lb 6.4 oz (53.252 kg)  SpO2 99%  Constitutional: She is oriented to person, place, and time. She appears well-developed and well-nourished. No distress.   HENT:   Head: Normocephalic and atraumatic.   Mouth/Throat: Oropharynx is clear and moist. No oropharyngeal exudate. Has partial plates   Neck: Normal range of motion. Neck supple. No JVD present. No thyromegaly present.   Cardiovascular: Normal rate, regular  rhythm and normal heart sounds.  Systolic murmur   Pulmonary/Chest: Effort normal and breath sounds normal. No respiratory distress. She has no wheezes. She has no rales. She exhibits no tenderness.   Abdominal: Soft. Bowel sounds are normal. She exhibits no distension.  Musculoskeletal: Normal range of motion. She exhibits no edema and no tenderness. Using a walker, has mild scoliosis  Psychiatric: She has a normal mood and affect. Her behavior is normal.   Labs- mmse 10/13/13 25/30, failed clock draw   Assessment/plan  1. Hypertension Stable bp readings. Continue amlodipine 10 mg daily and losartan 50 mg daily for now with lopressor 50 mg bid. Continue to monitor bp and if stable, consider lowering bp meds  2. Dementia, vascular, mixed olerating aricept well. bp well controlled. Continue aricept and will add vayacog once a day  3. Other and unspecified hyperlipidemia Continue zocor for now  4. Hip fracture, left Will need PT referral to resume therapy. Patient will let us know after taking to home health agency about a need for new referral  5. Left leg weakness Will need to resume PT/OT goven her recent hip fracture and replacement

## 2013-10-13 NOTE — Progress Notes (Signed)
Failed clock drawing  

## 2013-10-14 ENCOUNTER — Telehealth: Payer: Self-pay | Admitting: Internal Medicine

## 2013-10-14 DIAGNOSIS — S72142A Displaced intertrochanteric fracture of left femur, initial encounter for closed fracture: Secondary | ICD-10-CM

## 2013-10-14 NOTE — Telephone Encounter (Signed)
I received a message from Patton SallesKathy Campbell at [facility] she stated patient is requesting [facility] and they would like to know if they can get an order for it..Marland Kitchen

## 2013-10-14 NOTE — Telephone Encounter (Signed)
[facility]ade LloydPandey.

## 2013-10-14 NOTE — Telephone Encounter (Signed)
That is fine  thanks

## 2013-10-15 DIAGNOSIS — R269 Unspecified abnormalities of gait and mobility: Secondary | ICD-10-CM

## 2013-10-15 DIAGNOSIS — S72009D Fracture of unspecified part of neck of unspecified femur, subsequent encounter for closed fracture with routine healing: Secondary | ICD-10-CM

## 2013-10-15 DIAGNOSIS — S7290XA Unspecified fracture of unspecified femur, initial encounter for closed fracture: Secondary | ICD-10-CM

## 2013-10-15 DIAGNOSIS — M6281 Muscle weakness (generalized): Secondary | ICD-10-CM

## 2013-10-19 ENCOUNTER — Ambulatory Visit: Payer: Medicare Other | Admitting: Internal Medicine

## 2013-12-23 ENCOUNTER — Encounter: Payer: Self-pay | Admitting: Nurse Practitioner

## 2013-12-23 ENCOUNTER — Ambulatory Visit (INDEPENDENT_AMBULATORY_CARE_PROVIDER_SITE_OTHER): Payer: Medicare Other | Admitting: Nurse Practitioner

## 2013-12-23 VITALS — BP 122/70 | HR 70 | Temp 97.6°F | Wt 113.0 lb

## 2013-12-23 DIAGNOSIS — L409 Psoriasis, unspecified: Secondary | ICD-10-CM

## 2013-12-23 DIAGNOSIS — L408 Other psoriasis: Secondary | ICD-10-CM

## 2013-12-23 MED ORDER — TRIAMCINOLONE ACETONIDE 0.1 % EX CREA: 1.0000 "application " | TOPICAL_CREAM | Freq: Two times a day (BID) | CUTANEOUS | Status: DC

## 2013-12-23 NOTE — Progress Notes (Signed)
Patient ID: Beverly Barnett, female   DOB: 06/12/1923, 80 y.o.   MRN: [id number]    No Known Allergies  Chief Complaint  Patient presents with  . Leg Problem    Spots on lower left leg. No change in daily routine   . Medication Management    Patient not on Zocor-just stopped a long time , no reason why     HPI: Patient is a 80 y.o. female seen in the office today for spots on her leg. Reports they have been there fore 2-3 days but do not bother her however daughter wanted her to be seen. Overall feels well. No itching, pain or heat to leg Does not have spots any where else.   Review of Systems:  Review of Systems  Constitutional: Negative for fever, chills and malaise/fatigue.  Respiratory: Negative for shortness of breath.   Cardiovascular: Negative for chest pain.  Gastrointestinal: Negative for heartburn, diarrhea and constipation.  Genitourinary: Negative.        No changes in urine  Musculoskeletal: Negative for joint pain and myalgias.  Skin: Negative for itching and rash.  Neurological: Negative for dizziness, weakness and headaches.  Psychiatric/Behavioral: Positive for memory loss.     Past Medical History  Diagnosis Date  . Hypertension   . High cholesterol   . Osteoporosis   . Type II or unspecified type diabetes mellitus without mention of complication, not stated as uncontrolled   . Hyperosmolality and/or hypernatremia   . Dysphagia, unspecified(787.20)   . Other atopic dermatitis and related conditions   . Encounter for long-term (current) use of other medications   . Cardiac catheterisation as the cause of abnormal reaction of patient, or of later complication 10/05/2006   Past Surgical History  Procedure Laterality Date  . Femur surgery Right 03/2010  . Shoulder surgery Left 03/2010  . Femur im nail  07/24/2011    Procedure: INTRAMEDULLARY (IM) NAIL FEMORAL;  Surgeon: Javier DockerJeffrey C Beane, MD;  Location: [facility];  Service: Orthopedics;  Laterality: Right;  Biomet,  c-arm, Jackson table  . Intramedullary (im) nail intertrochanteric Left 07/30/2013    Procedure: INTRAMEDULLARY (IM) NAIL INTERTROCHANTRIC LEFT;  Surgeon: Shelda PalMatthew D Olin, MD;  Location: [facility];  Service: Orthopedics;  Laterality: Left;   Social History:   reports that she has never smoked. She does not have any smokeless tobacco history on file. She reports that she does not drink alcohol or use illicit drugs.  Family History  Problem Relation Age of Onset  . Cancer Sister   . Heart disease Father     Medications: Patient's Medications  New Prescriptions   TRIAMCINOLONE CREAM (KENALOG) 0.1 %    Apply 1 application topically 2 (two) times daily. Until resolved  Previous Medications   AMLODIPINE (NORVASC) 10 MG TABLET    Take 1 tablet (10 mg total) by mouth daily.   COENZYME Q10 (CO Q 10) 100 MG CAPS    Take 1 tablet by mouth 2 (two) times daily.   DONEPEZIL (ARICEPT) 5 MG TABLET    Take 1 tablet (5 mg total) by mouth at bedtime.   LOSARTAN (COZAAR) 50 MG TABLET    TAKE ONE TABLET BY MOUTH EVERY DAY FOR BLOOD PRESSURE   METOPROLOL (LOPRESSOR) 50 MG TABLET    Take 1 tablet (50 mg total) by mouth 2 (two) times daily.   MULTIPLE VITAMIN (MULITIVITAMIN WITH MINERALS) TABS    Take 1 tablet by mouth daily. 2 in am, 1 in pm  PHOSPHATIDYLSERINE-DHA-EPA 100-19.5-6.5 MG CAPS    Take 1 capsule by mouth daily.   SIMVASTATIN (ZOCOR) 40 MG TABLET    Take 40 mg by mouth daily.  Modified Medications   No medications on file  Discontinued Medications   No medications on file     Physical Exam:  Filed Vitals:   12/23/13 1439  BP: 122/70  Pulse: 70  Temp: 97.6 F (36.4 C)  TempSrc: Oral  Weight: 113 lb (51.256 kg)  SpO2: 98%    Physical Exam  Constitutional: She is well-developed, well-nourished, and in no distress.  Neck: Normal range of motion. Neck supple.  Cardiovascular: Normal rate, regular rhythm and normal heart sounds.   Pulmonary/Chest: Effort normal and breath sounds normal.    Abdominal: Soft. Bowel sounds are normal.  Musculoskeletal: She exhibits no edema and no tenderness.  Neurological: She is alert.  Skin: Skin is warm and dry. Rash noted.  Scattered erythematous papules and plaques to left lower leg, ankle and dorsal surface of left foot. No other places on the body  Psychiatric: Affect normal.     Labs reviewed: Basic Metabolic Panel:  Recent Labs  16/10/96 0800 08/01/13 0537 08/02/13 0400  NA 139 141 141  K 3.9 4.1 4.1  CL 103 104 107  CO2 26 26 24   GLUCOSE 131* 122* 120*  BUN 16 22 21   CREATININE 0.76 0.84 0.73  CALCIUM 8.2* 8.6 8.3*   Liver Function Tests: No results found for this basename: AST, ALT, ALKPHOS, BILITOT, PROT, ALBUMIN,  in the last 8760 hours No results found for this basename: LIPASE, AMYLASE,  in the last 8760 hours No results found for this basename: AMMONIA,  in the last 8760 hours CBC:  Recent Labs  07/31/13 0800 08/01/13 0537 08/02/13 0400 08/03/13 0410  WBC 10.6* 14.0* 9.5  --   HGB 8.0* 8.7* 7.5* 9.1*  HCT 23.6* 25.6* 22.7* 26.5*  MCV 91.8 92.1 92.3  --   PLT 145* 162 148*  --    Lipid Panel:  Recent Labs  01/01/13 0845  HDL 52  LDLCALC 129*  TRIG 180*   TSH: No results found for this basename: TSH,  in the last 8760 hours A1C: Lab Results  Component Value Date   HGBA1C 5.6 07/29/2013     Assessment/Plan 1. Psoriasis -limited to left lower leg and dorsal foot on left. Does not bother pt, for now will treat with triamcinolone cream (KENALOG) 0.1 %; Apply 1 application topically 2 (two) times daily. Until resolved -to keep follow up with Glade Lloyd and follow up sooner if needed

## 2014-01-11 ENCOUNTER — Ambulatory Visit (INDEPENDENT_AMBULATORY_CARE_PROVIDER_SITE_OTHER): Payer: Medicare Other | Admitting: Internal Medicine

## 2014-01-11 ENCOUNTER — Encounter: Payer: Self-pay | Admitting: Internal Medicine

## 2014-01-11 VITALS — BP 148/76 | HR 66 | Temp 98.1°F | Resp 10 | Ht 58.5 in | Wt 112.5 lb

## 2014-01-11 DIAGNOSIS — G4700 Insomnia, unspecified: Secondary | ICD-10-CM

## 2014-01-11 DIAGNOSIS — D509 Iron deficiency anemia, unspecified: Secondary | ICD-10-CM

## 2014-01-11 DIAGNOSIS — F0150 Vascular dementia without behavioral disturbance: Secondary | ICD-10-CM

## 2014-01-11 DIAGNOSIS — R21 Rash and other nonspecific skin eruption: Secondary | ICD-10-CM

## 2014-01-11 DIAGNOSIS — E785 Hyperlipidemia, unspecified: Secondary | ICD-10-CM

## 2014-01-11 DIAGNOSIS — I10 Essential (primary) hypertension: Secondary | ICD-10-CM

## 2014-01-11 MED ORDER — TRIAMCINOLONE ACETONIDE 0.5 % EX OINT: 1.0000 "application " | TOPICAL_OINTMENT | Freq: Two times a day (BID) | CUTANEOUS | Status: DC

## 2014-01-11 MED ORDER — PREDNISONE 20 MG PO TABS: 20.0000 mg | ORAL_TABLET | Freq: Every day | ORAL | Status: DC

## 2014-01-11 MED ORDER — MELATONIN 3 MG PO CAPS: 1.0000 | ORAL_CAPSULE | Freq: Every evening | ORAL | Status: DC | PRN

## 2014-01-11 NOTE — Progress Notes (Signed)
Patient ID: Beverly Barnett, female   DOB: June 20, 1923, 80 y.o.   MRN: [id number]    Chief Complaint  Patient presents with  . Medical Management of Chronic Issues    3 month follow-up  . Rash    Follow-up on ongoing rash since last ov, no improvement. Medication not working    No Known Allergies  HPI 80 y/o female pt here for routine follow up. Her bp is controlled. Complaint with her meds. Has developed a rash in her left leg and this has been there for 3 weeks now. Denies itching, pain or worsening of rash. No blisters, no drainage and no further spread of her lesion. It started at bottom of her feet and spread to her mid leg on left side and has few lesion on right side. No rash/ lesion elsewhere. No new detergents/ chemicals/ medications/ soap/ lotions/ fabrics. Denies any outdoor activities  Review of Systems  Constitutional: Negative for fever, chills and malaise/fatigue.  Respiratory: Negative for shortness of breath.   Cardiovascular: Negative for chest pain.  Gastrointestinal: Negative for heartburn, diarrhea and constipation.  Genitourinary: Negative.    Musculoskeletal: Negative for joint pain and myalgias.  Skin: Negative for itching   Neurological: Negative for dizziness, weakness and headaches.  Psychiatric/Behavioral: Positive for memory loss.   Past Medical History  Diagnosis Date  . Hypertension   . High cholesterol   . Osteoporosis   . Type II or unspecified type diabetes mellitus without mention of complication, not stated as uncontrolled   . Hyperosmolality and/or hypernatremia   . Dysphagia, unspecified(787.20)   . Other atopic dermatitis and related conditions   . Encounter for long-term (current) use of other medications   . Cardiac catheterisation as the cause of abnormal reaction of patient, or of later complication 10/05/2006   Current Outpatient Prescriptions on File Prior to Visit  Medication Sig Dispense Refill  . amLODipine (NORVASC) 10 MG tablet Take  1 tablet (10 mg total) by mouth daily.  30 tablet  3  . Coenzyme Q10 (CO Q 10) 100 MG CAPS Take 1 tablet by mouth 2 (two) times daily.      Marland Kitchen donepezil (ARICEPT) 5 MG tablet Take 1 tablet (5 mg total) by mouth at bedtime.  90 tablet  1  . losartan (COZAAR) 50 MG tablet TAKE ONE TABLET BY MOUTH EVERY DAY FOR BLOOD PRESSURE  90 tablet  1  . metoprolol (LOPRESSOR) 50 MG tablet Take 1 tablet (50 mg total) by mouth 2 (two) times daily.  180 tablet  1  . Multiple Vitamin (MULITIVITAMIN WITH MINERALS) TABS Take 1 tablet by mouth daily. 2 in am, 1 in pm      . Phosphatidylserine-DHA-EPA 100-19.5-6.5 MG CAPS Take 1 capsule by mouth daily.  30 capsule  3  . simvastatin (ZOCOR) 40 MG tablet Take 40 mg by mouth daily.       No current facility-administered medications on file prior to visit.   Physical exam BP 148/76  Pulse 66  Temp(Src) 98.1 F (36.7 C) (Oral)  Resp 10  Ht 4' 10.5" (1.486 m)  Wt 112 lb 8 oz (51.03 kg)  BMI 23.11 kg/m2  SpO2 97%  Constitutional: She is well-developed, well-nourished, and in no distress.  Neck: Normal range of motion. Neck supple.  Cardiovascular: Normal rate, regular rhythm and normal heart sounds.   Pulmonary/Chest: Effort normal and breath sounds normal.  Abdominal: Soft. Bowel sounds are normal.  Musculoskeletal: She exhibits no edema and no tenderness.  Neurological: She is alert.  Skin: Skin is warm and dry.  Has macules and papules in left lower leg and dorsum of foot and right lower leg and one in right foot, erythematous, blanches, non tender, no skin breakdown Psychiatric: Affect normal.   Labs: CBC Latest Ref Rng 08/03/2013 08/02/2013 08/01/2013  WBC 4.0 - 10.5 K/uL - 9.5 14.0(H)  Hemoglobin 12.0 - 15.0 g/dL 1.6(X9.1(L) 7.5(L) 8.7(L)  Hematocrit 36.0 - 46.0 % 26.5(L) 22.7(L) 25.6(L)  Platelets 150 - 400 K/uL - 148(L) 162   CMP     Component Value Date/Time   NA 141 08/02/2013 0400   NA 142 06/22/2013 1259   K 4.1 08/02/2013 0400   CL 107 08/02/2013  0400   CO2 24 08/02/2013 0400   GLUCOSE 120* 08/02/2013 0400   GLUCOSE 94 06/22/2013 1259   BUN 21 08/02/2013 0400   BUN 18 06/22/2013 1259   CREATININE 0.73 08/02/2013 0400   CALCIUM 8.3* 08/02/2013 0400   PROT 7.3 07/23/2011 1656   ALBUMIN 3.8 07/23/2011 1656   AST 27 07/23/2011 1656   ALT 17 07/23/2011 1656   ALKPHOS 60 07/23/2011 1656   BILITOT 0.5 07/23/2011 1656   GFRNONAA 75* 08/02/2013 0400   GFRAA 87* 08/02/2013 0400   Lipid Panel     Component Value Date/Time   TRIG 180* 01/01/2013 0845   HDL 52 01/01/2013 0845   LDLCALC 129* 01/01/2013 0845    Lab Results  Component Value Date   HGBA1C 5.6 07/29/2013   1. Rash and nonspecific skin eruption Unclear of the cause. likely has contact dermatitis- will have her on triamcinolone cream 0.5% bid for 5 days, if no improvement, will start prednisone 20 mg daily for 5 days. No vesicles, pain, itching and not confined to one dermatome making shingles unlikely. No signs of superifection with bacteria. Does not appear to be a fungal rash. If persists, will get dermatology referral. Monitor for blistering or skin breakdown or worsening of the rash.  - CBC with Differential - CMP  2. Iron deficiency anemia Recheck cbc today  3. Hyperlipidemia with target LDL less than 130 Check lipid panel.  Continue zocor 40 mg daily - Lipid Panel  4. Dementia, vascular, mixed, without behavioral disturbance Continue aricept for now  5. Essential hypertension, benign Stable. Continue amlodipine and cozaar with lopressor for now  6. Insomnia Start on melatonin 3 mg qhs, can take additional 1 tab if does not help. Sleep hygiene counselled about

## 2014-01-12 LAB — CBC WITH DIFFERENTIAL/PLATELET
BASOS: 0 %
Basophils Absolute: 0 10*3/uL (ref 0.0–0.2)
EOS ABS: 0.2 10*3/uL (ref 0.0–0.4)
Eos: 3 %
HEMATOCRIT: 37.8 % (ref 34.0–46.6)
HEMOGLOBIN: 12.4 g/dL (ref 11.1–15.9)
Immature Grans (Abs): 0 10*3/uL (ref 0.0–0.1)
Immature Granulocytes: 0 %
LYMPHS: 31 %
Lymphocytes Absolute: 2.1 10*3/uL (ref 0.7–3.1)
MCH: 30.1 pg (ref 26.6–33.0)
MCHC: 32.8 g/dL (ref 31.5–35.7)
MCV: 92 fL (ref 79–97)
Monocytes Absolute: 0.8 10*3/uL (ref 0.1–0.9)
Monocytes: 12 %
NEUTROS ABS: 3.6 10*3/uL (ref 1.4–7.0)
Neutrophils Relative %: 54 %
RBC: 4.12 x10E6/uL (ref 3.77–5.28)
RDW: 14.2 % (ref 12.3–15.4)
WBC: 6.8 10*3/uL (ref 3.4–10.8)

## 2014-01-12 LAB — LIPID PANEL
CHOL/HDL RATIO: 3.9 ratio (ref 0.0–4.4)
Cholesterol, Total: 226 mg/dL — ABNORMAL HIGH (ref 100–199)
HDL: 58 mg/dL (ref >39)
LDL CALC: 137 mg/dL — AB (ref 0–99)
Triglycerides: 153 mg/dL — ABNORMAL HIGH (ref 0–149)
VLDL CHOLESTEROL CAL: 31 mg/dL (ref 5–40)

## 2014-01-12 LAB — COMPREHENSIVE METABOLIC PANEL
A/G RATIO: 1.7 (ref 1.1–2.5)
ALBUMIN: 4.3 g/dL (ref 3.5–4.7)
ALK PHOS: 69 IU/L (ref 39–117)
ALT: 10 IU/L (ref 0–32)
AST: 18 IU/L (ref 0–40)
BUN/Creatinine Ratio: 22 (ref 11–26)
BUN: 21 mg/dL (ref 8–27)
CO2: 24 mmol/L (ref 18–29)
Calcium: 9.8 mg/dL (ref 8.7–10.3)
Chloride: 101 mmol/L (ref 97–108)
Creatinine, Ser: 0.97 mg/dL (ref 0.57–1.00)
GFR calc Af Amer: 61 mL/min/{1.73_m2} (ref >59)
GFR, EST NON AFRICAN AMERICAN: 53 mL/min/{1.73_m2} — AB (ref >59)
GLUCOSE: 97 mg/dL (ref 65–99)
Globulin, Total: 2.6 g/dL (ref 1.5–4.5)
Potassium: 4.5 mmol/L (ref 3.5–5.2)
Sodium: 142 mmol/L (ref 134–144)
TOTAL PROTEIN: 6.9 g/dL (ref 6.0–8.5)
Total Bilirubin: 0.7 mg/dL (ref 0.0–1.2)

## 2014-02-08 ENCOUNTER — Ambulatory Visit (INDEPENDENT_AMBULATORY_CARE_PROVIDER_SITE_OTHER): Payer: Medicare Other | Admitting: Internal Medicine

## 2014-02-08 ENCOUNTER — Encounter: Payer: Self-pay | Admitting: Internal Medicine

## 2014-02-08 VITALS — BP 160/70 | HR 61 | Temp 97.4°F | Resp 18 | Ht 58.5 in | Wt 113.6 lb

## 2014-02-08 DIAGNOSIS — L959 Vasculitis limited to the skin, unspecified: Secondary | ICD-10-CM | POA: Insufficient documentation

## 2014-02-08 DIAGNOSIS — I10 Essential (primary) hypertension: Secondary | ICD-10-CM

## 2014-02-08 DIAGNOSIS — E785 Hyperlipidemia, unspecified: Secondary | ICD-10-CM

## 2014-02-08 DIAGNOSIS — L988 Other specified disorders of the skin and subcutaneous tissue: Secondary | ICD-10-CM

## 2014-02-08 MED ORDER — SIMVASTATIN 10 MG PO TABS: 40.0000 mg | ORAL_TABLET | Freq: Every day | ORAL | Status: DC

## 2014-02-08 MED ORDER — ATORVASTATIN CALCIUM 10 MG PO TABS: 10.0000 mg | ORAL_TABLET | Freq: Every day | ORAL | Status: DC

## 2014-02-08 NOTE — Progress Notes (Signed)
Patient ID: Beverly Barnett, female   DOB: 22-May-1924, 80 y.o.   MRN: [id number]    Chief Complaint  Patient presents with  . Follow-up    rash, HTN   No Known Allergies  HPI 80 y/o female pt here for follow up on her rash. She applied triamcinolone cream to her legs and rash has now resolved. Reviewed home bp readings- normal reading below 140/90 mostly. Is not taking her amlodipine and not taking her simvastatin- did not pick up from pharmacy. Reviewed genetic testing results Pt denies any concerns this visit  Review of Systems  Constitutional: Negative for fever, chills, weight loss, malaise/fatigue and diaphoresis.  HENT: Negative for congestion, hearing loss and sore throat.   Eyes: Negative for blurred vision, double vision and discharge.  Respiratory: Negative for cough, sputum production, shortness of breath and wheezing.   Cardiovascular: Negative for chest pain, palpitations, orthopnea and leg swelling.  Gastrointestinal: Negative for heartburn, nausea, vomiting, abdominal pain Musculoskeletal: Negative for back pain, falls, has a walker Skin: Negative for itching and rash.  Neurological: Negative for dizziness, tingling, focal weakness and headaches.  Psychiatric/Behavioral: Negative for depression. The patient is not nervous/anxious.    Past Medical History  Diagnosis Date  . Hypertension   . High cholesterol   . Osteoporosis   . Type II or unspecified type diabetes mellitus without mention of complication, not stated as uncontrolled   . Hyperosmolality and/or hypernatremia   . Dysphagia, unspecified(787.20)   . Other atopic dermatitis and related conditions   . Encounter for long-term (current) use of other medications   . Cardiac catheterisation as the cause of abnormal reaction of patient, or of later complication 10/05/2006   Current Outpatient Prescriptions on File Prior to Visit  Medication Sig Dispense Refill  . Coenzyme Q10 (CO Q 10) 100 MG CAPS Take 1 tablet by  mouth 2 (two) times daily.      Marland Kitchen donepezil (ARICEPT) 5 MG tablet Take 1 tablet (5 mg total) by mouth at bedtime.  90 tablet  1  . losartan (COZAAR) 50 MG tablet TAKE ONE TABLET BY MOUTH EVERY DAY FOR BLOOD PRESSURE  90 tablet  1  . Melatonin 3 MG CAPS Take 1 capsule (3 mg total) by mouth at bedtime and may repeat dose one time if needed.  45 capsule  3  . metoprolol (LOPRESSOR) 50 MG tablet Take 1 tablet (50 mg total) by mouth 2 (two) times daily.  180 tablet  1  . Multiple Vitamin (MULITIVITAMIN WITH MINERALS) TABS Take 1 tablet by mouth daily. 2 in am, 1 in pm      . Phosphatidylserine-DHA-EPA 100-19.5-6.5 MG CAPS Take 1 capsule by mouth daily.  30 capsule  3   No current facility-administered medications on file prior to visit.   Physical exam BP 160/70  Pulse 61  Temp(Src) 97.4 F (36.3 C) (Oral)  Resp 18  Ht 4' 10.5" (1.486 m)  Wt 113 lb 9.6 oz (51.529 kg)  BMI 23.34 kg/m2  SpO2 96%  Constitutional: She is well-developed, well-nourished, and in no distress.   Neck: Normal range of motion. Neck supple.   Cardiovascular: Normal rate, regular rhythm and normal heart sounds.    Pulmonary/Chest: Effort normal and breath sounds normal.   Abdominal: Soft. Bowel sounds are normal.  Musculoskeletal: She exhibits no edema and no tenderness.  Neurological: She is alert.   Skin: Skin is warm and dry.  Psychiatric: Affect normal.   labs Lipid Panel  Component Value Date/Time   TRIG 153* 01/11/2014 1421   HDL 58 01/11/2014 1421   CHOLHDL 3.9 01/11/2014 1421   LDLCALC 137* 01/11/2014 1421   Assessment/plan  1. Essential hypertension, benign Continue losartan 50 mg daily and lopressor 50 mg bid. Discontinue amlodipine  2. Vasculitis of skin Stop triamcinolone. Did not need prednisone. Resolved. Monitor clinically  3. Hyperlipidemia with target LDL less than 130 Will not tolerate simvastatin from her genetic studies. Will have her on atorvastatin 10 mg daily and reassess

## 2014-02-09 ENCOUNTER — Other Ambulatory Visit: Payer: Self-pay | Admitting: *Deleted

## 2014-02-09 MED ORDER — ATORVASTATIN CALCIUM 10 MG PO TABS: 10.0000 mg | ORAL_TABLET | Freq: Every day | ORAL | Status: DC

## 2014-02-11 ENCOUNTER — Other Ambulatory Visit: Payer: Self-pay | Admitting: *Deleted

## 2014-02-11 MED ORDER — ATORVASTATIN CALCIUM 10 MG PO TABS: 10.0000 mg | ORAL_TABLET | Freq: Every day | ORAL | Status: DC

## 2014-02-11 NOTE — Telephone Encounter (Signed)
[facility] Requested

## 2014-02-17 ENCOUNTER — Encounter: Payer: Self-pay | Admitting: Internal Medicine

## 2014-04-10 ENCOUNTER — Other Ambulatory Visit: Payer: Self-pay | Admitting: Internal Medicine

## 2014-05-03 ENCOUNTER — Ambulatory Visit (INDEPENDENT_AMBULATORY_CARE_PROVIDER_SITE_OTHER): Payer: Medicare Other | Admitting: Internal Medicine

## 2014-05-03 ENCOUNTER — Encounter: Payer: Self-pay | Admitting: Internal Medicine

## 2014-05-03 VITALS — BP 132/80 | HR 63 | Temp 98.2°F | Resp 18 | Wt 115.2 lb

## 2014-05-03 DIAGNOSIS — K589 Irritable bowel syndrome without diarrhea: Secondary | ICD-10-CM

## 2014-05-03 NOTE — Progress Notes (Signed)
Patient ID: Beverly Barnett, female   DOB: 05/10/1924, 80 y.o.   MRN: [id number]    Chief Complaint  Patient presents with  . Acute Visit    loose stools x 2.5 weeks, uncontrolled   No Known Allergies  HPI 80 y/o female patient seen for AV. She Has been having 1 episode of loose stool with urgency every day for last two and a half week. She mentions it to be explosive and she has stool on herself before making it to the restroom. She mentions the urgency bothers her. This has woken her from her sleep. Mentions being under stress and being nervous about her limited mobility. No loose stool episode for 2 days.  ROS Denies abdominal pain or cramps Denies nausea or vomiting Denies fever or chills Denies new food/ ingredient use No new medication Denies melena or rectal bleed  Wt Readings from Last 3 Encounters:  05/03/14 115 lb 3.2 oz (52.254 kg)  02/08/14 113 lb 9.6 oz (51.529 kg)  01/11/14 112 lb 8 oz (51.03 kg)   Past Medical History  Diagnosis Date  . Hypertension   . High cholesterol   . Osteoporosis   . Type II or unspecified type diabetes mellitus without mention of complication, not stated as uncontrolled   . Hyperosmolality and/or hypernatremia   . Dysphagia, unspecified(787.20)   . Other atopic dermatitis and related conditions   . Encounter for long-term (current) use of other medications   . Cardiac catheterisation as the cause of abnormal reaction of patient, or of later complication 10/05/2006   Physical exam BP 132/80 mmHg  Pulse 63  Temp(Src) 98.2 F (36.8 C) (Oral)  Resp 18  Wt 115 lb 3.2 oz (52.254 kg)  SpO2 98%  Constitutional: She is well-developed, well-nourished, and in no distress.   Neck: Normal range of motion. Neck supple.   Cardiovascular: Normal rate, regular rhythm and normal heart sounds.    Pulmonary/Chest: Effort normal and breath sounds normal.   Abdominal: Soft. Bowel sounds are normal.  Musculoskeletal: She exhibits no edema and no  tenderness.  Neurological: She is alert.   Skin: Skin is warm and dry.  Psychiatric: Affect normal.   Assessment/plan  1. IBS (irritable bowel syndrome) With her symptoms and presence of stress appears to be IBS. Unlikely to be gastroenteritis or diet related with one bowel movement a day and no new dietary intake. Weight stable, afebrile, appears well hydrated and vital signs stable. Advised on taking a cup of yogurt everyday for now. No episodes for last 2 days. Relaxation technique advised. Hold off on anxiolytics given pt calm in office and is elderly. If recurs, will get gi referral to rule out other cause by possible sigmoidoscopy.

## 2014-06-01 ENCOUNTER — Encounter: Payer: Medicare Other | Admitting: Internal Medicine

## 2014-06-27 ENCOUNTER — Encounter: Payer: Self-pay | Admitting: Nurse Practitioner

## 2014-06-27 ENCOUNTER — Ambulatory Visit (INDEPENDENT_AMBULATORY_CARE_PROVIDER_SITE_OTHER): Payer: Medicare Other | Admitting: Nurse Practitioner

## 2014-06-27 VITALS — BP 134/82 | HR 64 | Temp 97.7°F | Resp 10 | Ht 58.5 in | Wt 114.5 lb

## 2014-06-27 DIAGNOSIS — Z23 Encounter for immunization: Secondary | ICD-10-CM | POA: Diagnosis not present

## 2014-06-27 DIAGNOSIS — R197 Diarrhea, unspecified: Secondary | ICD-10-CM | POA: Diagnosis not present

## 2014-06-27 NOTE — Progress Notes (Signed)
Patient ID: Beverly Barnett, female   DOB: 1924-05-18, 81 y.o.   MRN: [id number]    PCP: Oneal Grout, MD  No Known Allergies  Chief Complaint  Patient presents with  . Acute Visit    Diarrhea, since before November, worse x 2 week. No blood present      HPI: Patient is a 81 y.o. female seen in the office today due to loose stools for several months. Was seen in the office in November by Dr Glade Lloyd who recommended yogurt.  Does not eat yogurt daily, does not remember.  Is taking imodium and had a form stool In the last week she has had 1-3 stools a day of diarrhea.  No recent changes to medication. No abdominal discomfort No N/V Review of Systems:  Review of Systems  Constitutional: Positive for appetite change (decrease appeitite). Negative for fever, chills and unexpected weight change.  Respiratory: Negative for shortness of breath.   Cardiovascular: Negative for chest pain.  Gastrointestinal: Positive for diarrhea. Negative for nausea, vomiting, abdominal pain, constipation, blood in stool and abdominal distention.  Genitourinary: Negative.   Skin: Negative for rash.  Neurological: Negative for dizziness, weakness and headaches.  Psychiatric/Behavioral:       Memory loss    Past Medical History  Diagnosis Date  . Hypertension   . High cholesterol   . Osteoporosis   . Type II or unspecified type diabetes mellitus without mention of complication, not stated as uncontrolled   . Hyperosmolality and/or hypernatremia   . Dysphagia, unspecified(787.20)   . Other atopic dermatitis and related conditions   . Encounter for long-term (current) use of other medications   . Cardiac catheterisation as the cause of abnormal reaction of patient, or of later complication 10/05/2006   Past Surgical History  Procedure Laterality Date  . Femur surgery Right 03/2010  . Shoulder surgery Left 03/2010  . Femur im nail  07/24/2011    Procedure: INTRAMEDULLARY (IM) NAIL FEMORAL;  Surgeon:  Javier Docker, MD;  Location: [facility];  Service: Orthopedics;  Laterality: Right;  Biomet, c-arm, Jackson table  . Intramedullary (im) nail intertrochanteric Left 07/30/2013    Procedure: INTRAMEDULLARY (IM) NAIL INTERTROCHANTRIC LEFT;  Surgeon: Shelda Pal, MD;  Location: [facility];  Service: Orthopedics;  Laterality: Left;   Social History:   reports that she has never smoked. She does not have any smokeless tobacco history on file. She reports that she does not drink alcohol or use illicit drugs.  Family History  Problem Relation Age of Onset  . Cancer Sister   . Heart disease Father     Medications: Patient's Medications  New Prescriptions   No medications on file  Previous Medications   ATORVASTATIN (LIPITOR) 10 MG TABLET    Take 1 tablet (10 mg total) by mouth daily.   COENZYME Q10 (CO Q 10) 100 MG CAPS    Take 1 tablet by mouth 2 (two) times daily.   DONEPEZIL (ARICEPT) 5 MG TABLET    Take 1 tablet (5 mg total) by mouth at bedtime.   LOSARTAN (COZAAR) 50 MG TABLET    TAKE ONE TABLET BY MOUTH EVERY DAY FOR BLOOD PRESSURE   MELATONIN 3 MG CAPS    Take 1 capsule (3 mg total) by mouth at bedtime and may repeat dose one time if needed.   METOPROLOL (LOPRESSOR) 50 MG TABLET    TAKE ONE TABLET BY MOUTH TWICE DAILY   MULTIPLE VITAMIN (MULITIVITAMIN WITH MINERALS) TABS  Take 1 tablet by mouth daily. 2 in am, 1 in pm  Modified Medications   No medications on file  Discontinued Medications   PHOSPHATIDYLSERINE-DHA-EPA 100-19.5-6.5 MG CAPS    Take 1 capsule by mouth daily.     Physical Exam:  Filed Vitals:   06/27/14 1517  BP: 134/82  Pulse: 64  Temp: 97.7 F (36.5 C)  TempSrc: Oral  Resp: 10  Height: 4' 10.5" (1.486 m)  Weight: 114 lb 8 oz (51.937 kg)  SpO2: 99%    Physical Exam  Constitutional: She is oriented to person, place, and time. She appears well-developed and well-nourished. No distress.  Neck: Normal range of motion. Neck supple.  Cardiovascular: Normal  rate, regular rhythm and normal heart sounds.   Systolic murmur  Pulmonary/Chest: Effort normal and breath sounds normal.  Abdominal: Soft. She exhibits no distension and no mass. Bowel sounds are increased. There is no tenderness. There is no rebound and no guarding.  Neurological: She is alert and oriented to person, place, and time.  Skin: Skin is warm and dry. She is not diaphoretic.    Labs reviewed: Basic Metabolic Panel:  Recent Labs  16/03/9601/22/15 0537 08/02/13 0400 01/11/14 1421  NA 141 141 142  K 4.1 4.1 4.5  CL 104 107 101  CO2 26 24 24   GLUCOSE 122* 120* 97  BUN 22 21 21   CREATININE 0.84 0.73 0.97  CALCIUM 8.6 8.3* 9.8   Liver Function Tests:  Recent Labs  01/11/14 1421  AST 18  ALT 10  ALKPHOS 69  BILITOT 0.7  PROT 6.9   No results for input(s): LIPASE, AMYLASE in the last 8760 hours. No results for input(s): AMMONIA in the last 8760 hours. CBC:  Recent Labs  07/31/13 0800 08/01/13 0537 08/02/13 0400 08/03/13 0410 01/11/14 1421  WBC 10.6* 14.0* 9.5  --  6.8  NEUTROABS  --   --   --   --  3.6  HGB 8.0* 8.7* 7.5* 9.1* 12.4  HCT 23.6* 25.6* 22.7* 26.5* 37.8  MCV 91.8 92.1 92.3  --  92  PLT 145* 162 148*  --   --    Lipid Panel:  Recent Labs  01/11/14 1421  HDL 58  LDLCALC 137*  TRIG 153*  CHOLHDL 3.9   TSH: No results for input(s): TSH in the last 8760 hours. A1C: Lab Results  Component Value Date   HGBA1C 5.6 07/29/2013     Assessment/Plan 1. Diarrhea -ongoing since November, has not done yogurt regularly -more frequently over the last week -to use florastor BID and metamucil BID -will get blood work at this time - Comprehensive metabolic panel - Amylase - Lipase - advised again imodium due to side effects  To follow up in 1-2 weeks

## 2014-06-27 NOTE — Patient Instructions (Signed)
Start florastor twice daily benefiber twice daily   Follow up NEXT Thursday 07/07/14

## 2014-06-28 LAB — COMPREHENSIVE METABOLIC PANEL
ALT: 12 IU/L (ref 0–32)
AST: 22 IU/L (ref 0–40)
Albumin/Globulin Ratio: 1.7 (ref 1.1–2.5)
Albumin: 4.3 g/dL (ref 3.5–4.7)
Alkaline Phosphatase: 88 IU/L (ref 39–117)
BUN/Creatinine Ratio: 23 (ref 11–26)
BUN: 21 mg/dL (ref 8–27)
CO2: 25 mmol/L (ref 18–29)
CREATININE: 0.91 mg/dL (ref 0.57–1.00)
Calcium: 9.3 mg/dL (ref 8.7–10.3)
Chloride: 103 mmol/L (ref 97–108)
GFR calc Af Amer: 65 mL/min/{1.73_m2} (ref >59)
GFR, EST NON AFRICAN AMERICAN: 57 mL/min/{1.73_m2} — AB (ref >59)
GLOBULIN, TOTAL: 2.6 g/dL (ref 1.5–4.5)
GLUCOSE: 91 mg/dL (ref 65–99)
Potassium: 4.5 mmol/L (ref 3.5–5.2)
Sodium: 143 mmol/L (ref 134–144)
TOTAL PROTEIN: 6.9 g/dL (ref 6.0–8.5)
Total Bilirubin: 0.6 mg/dL (ref 0.0–1.2)

## 2014-06-28 LAB — LIPASE: Lipase: 44 U/L (ref 0–59)

## 2014-06-28 LAB — AMYLASE: Amylase: 89 U/L (ref 31–124)

## 2014-07-11 ENCOUNTER — Other Ambulatory Visit: Payer: Self-pay | Admitting: Internal Medicine

## 2014-07-11 ENCOUNTER — Ambulatory Visit (INDEPENDENT_AMBULATORY_CARE_PROVIDER_SITE_OTHER): Payer: Medicare Other | Admitting: Nurse Practitioner

## 2014-07-11 ENCOUNTER — Encounter: Payer: Self-pay | Admitting: Nurse Practitioner

## 2014-07-11 VITALS — BP 156/70 | HR 68 | Temp 97.2°F | Resp 18 | Ht <= 58 in | Wt 113.5 lb

## 2014-07-11 DIAGNOSIS — F0150 Vascular dementia without behavioral disturbance: Secondary | ICD-10-CM | POA: Diagnosis not present

## 2014-07-11 DIAGNOSIS — R197 Diarrhea, unspecified: Secondary | ICD-10-CM | POA: Diagnosis not present

## 2014-07-11 DIAGNOSIS — R531 Weakness: Secondary | ICD-10-CM | POA: Diagnosis not present

## 2014-07-11 DIAGNOSIS — W1809XA Striking against other object with subsequent fall, initial encounter: Secondary | ICD-10-CM | POA: Diagnosis not present

## 2014-07-11 NOTE — Patient Instructions (Signed)
Stop aricept to see if this is the cause Also florastore BID and may increase benefiber to 3 times a day    Follow up if these interventions are not effective

## 2014-07-11 NOTE — Progress Notes (Signed)
Patient ID: Beverly Barnett, female   DOB: 1923/12/15, 81 y.o.   MRN: [id number]    PCP: Oneal Grout, MD  No Known Allergies  Chief Complaint  Patient presents with  . Medical Management of Chronic Issues     HPI: Patient is a 81 y.o. female seen in the office today to follow up loose stools for several months. Pt was seen 2 weeks ago recommended to use florastor BID and metamucil BID, and to try yogurt. Pt currently taking metamucil only stool are better with the fact that she is not having accidents but they are still loose.  Medication review reveals that even though Aricept was started back in [place] she did not start taking it routinely until a few months ago (and her loose stools started a few months ago per daughter) Pt also had a fall last night, has had 4 falls in the last month in a half. Was not holding her walker and had a cup of coffee, hit the wall and then slide down. Able to walk now. Does not have any pain. Daughter at visit and states they are looking into assisted living/independent  living facilities to help.    Review of Systems:   Review of Systems  Constitutional: Positive for appetite change (decrease appeitite). Negative for fever, chills and unexpected weight change.  Respiratory: Negative for shortness of breath.   Cardiovascular: Negative for chest pain.  Gastrointestinal: Positive for diarrhea. Negative for nausea, vomiting, abdominal pain, constipation, blood in stool and abdominal distention.  Genitourinary: Negative.   Musculoskeletal: Positive for gait problem (frequent falls). Negative for myalgias, back pain, joint swelling and arthralgias.  Skin: Negative for rash.  Neurological: Positive for weakness. Negative for dizziness and headaches.  Psychiatric/Behavioral:       Memory loss    Past Medical History  Diagnosis Date  . Hypertension   . High cholesterol   . Osteoporosis   . Type II or unspecified type diabetes mellitus without mention of  complication, not stated as uncontrolled   . Hyperosmolality and/or hypernatremia   . Dysphagia, unspecified(787.20)   . Other atopic dermatitis and related conditions   . Encounter for long-term (current) use of other medications   . Cardiac catheterisation as the cause of abnormal reaction of patient, or of later complication 10/05/2006   Past Surgical History  Procedure Laterality Date  . Femur surgery Right 03/2010  . Shoulder surgery Left 03/2010  . Femur im nail  07/24/2011    Procedure: INTRAMEDULLARY (IM) NAIL FEMORAL;  Surgeon: Javier Docker, MD;  Location: [facility];  Service: Orthopedics;  Laterality: Right;  Biomet, c-arm, Jackson table  . Intramedullary (im) nail intertrochanteric Left 07/30/2013    Procedure: INTRAMEDULLARY (IM) NAIL INTERTROCHANTRIC LEFT;  Surgeon: Shelda Pal, MD;  Location: [facility];  Service: Orthopedics;  Laterality: Left;   Social History:   reports that she has never smoked. She does not have any smokeless tobacco history on file. She reports that she does not drink alcohol or use illicit drugs.  Family History  Problem Relation Age of Onset  . Cancer Sister   . Heart disease Father     Medications: Patient's Medications  New Prescriptions   No medications on file  Previous Medications   ATORVASTATIN (LIPITOR) 10 MG TABLET    Take 1 tablet (10 mg total) by mouth daily.   B COMPLEX VITAMINS (B-COMPLEX/B-12) TABS    Take 1 tablet by mouth daily.   COENZYME Q10 (CO Q 10)  100 MG CAPS    Take 1 tablet by mouth 2 (two) times daily.   DONEPEZIL (ARICEPT) 5 MG TABLET    Take 1 tablet (5 mg total) by mouth at bedtime.   LOSARTAN (COZAAR) 50 MG TABLET    TAKE ONE TABLET BY MOUTH EVERY DAY FOR BLOOD PRESSURE   MELATONIN 3 MG CAPS    Take 1 capsule (3 mg total) by mouth at bedtime and may repeat dose one time if needed.   METOPROLOL (LOPRESSOR) 50 MG TABLET    TAKE ONE TABLET BY MOUTH TWICE DAILY   MULTIPLE VITAMIN (MULITIVITAMIN WITH MINERALS) TABS     Take 1 tablet by mouth daily. 2 in am, 1 in pm   PHOSPHATIDYLSERINE-DHA-EPA 100-19.5-6.5 MG CAPS    Take 1 capsule by mouth daily.  Modified Medications   No medications on file  Discontinued Medications   No medications on file     Physical Exam:  Filed Vitals:   07/11/14 1506  BP: 156/70  Pulse: 68  Temp: 97.2 F (36.2 C)  TempSrc: Oral  Resp: 18  Height:  (1.473 m)  Weight: 113 lb 8 oz (51.483 kg)  SpO2: 96%    Physical Exam  Constitutional: She is oriented to person, place, and time. She appears well-developed and well-nourished. No distress.  Neck: Normal range of motion. Neck supple.  Cardiovascular: Normal rate, regular rhythm and normal heart sounds.   Systolic murmur  Pulmonary/Chest: Effort normal and breath sounds normal.  Abdominal: Soft. She exhibits no distension and no mass. Bowel sounds are increased. There is no tenderness. There is no rebound and no guarding.  Musculoskeletal: Normal range of motion. She exhibits no edema or tenderness.  Small bruise to left hip  Neurological: She is alert and oriented to person, place, and time.  Skin: Skin is warm and dry. She is not diaphoretic.     Labs reviewed: Basic Metabolic Panel:  Recent Labs  16/10/96 0400 01/11/14 1421 06/27/14 1620  NA 141 142 143  K 4.1 4.5 4.5  CL 107 101 103  CO2 GLUCOSE 120* 97 91  BUN CREATININE 0.73 0.97 0.91  CALCIUM 8.3* 9.8 9.3   Liver Function Tests:  Recent Labs  01/11/14 1421 06/27/14 1620  AST 18 22  ALT 10 12  ALKPHOS 69 88  BILITOT 0.7 0.6  PROT 6.9 6.9    Recent Labs  06/27/14 1620  LIPASE 44  AMYLASE 89   No results for input(s): AMMONIA in the last 8760 hours. CBC:  Recent Labs  07/31/13 0800 08/01/13 0537 08/02/13 0400 08/03/13 0410 01/11/14 1421  WBC 10.6* 14.0* 9.5  --  6.8  NEUTROABS  --   --   --   --  3.6  HGB 8.0* 8.7* 7.5* 9.1* 12.4  HCT 23.6* 25.6* 22.7* 26.5* 37.8  MCV 91.8 92.1 92.3  --  92  PLT  145* 162 148*  --   --    Lipid Panel:  Recent Labs  01/11/14 1421  HDL 58  LDLCALC 137*  TRIG 153*  CHOLHDL 3.9   TSH: No results for input(s): TSH in the last 8760 hours. A1C: Lab Results  Component Value Date   HGBA1C 5.6 07/29/2013     Assessment/Plan 1. Diarrhea -to stop aricept to see if this helps symptoms, if no change may restart -cont benefiber BID and may increase to TID if needed -also if symptoms do not resolved with stopping  Aricept to start florastor twice daily (she is not currently taking   2. Fall against object, initial encounter -increased falls, no pain at this time, however recurring issue - Ambulatory referral to Home Health  3. Weakness -daughter reports worsening weakness and increased falls over the last month - will get Ambulatory referral to Home Health for strength and gait training   4. Vascular dementia, without behavioral disturbance -will stop aricept to see if this helps with diarrhea, if this has no effect will restart. Also discussed Namenda but will re-evaluate at next visit due to loose stools at this time.   To keep follow up with Dr Glade LloydPandey at the end of the month

## 2014-07-12 ENCOUNTER — Telehealth: Payer: Self-pay | Admitting: Internal Medicine

## 2014-07-12 NOTE — Telephone Encounter (Signed)
Given Application for Residency to [facility] to Dr. Glade LloydPandey. Patient had an appointment with Shanda BumpsJessica on 07/11/14 for diarrhea but patient also has an appointment on 2/23 for CPE. Left note for Dr. Glade Lloydpandey if she wants to use DOS 07/11/14 or wait until CPE.

## 2014-07-12 NOTE — Telephone Encounter (Signed)
[name] Tomassi dropped off a  Form for Independent and Supportive Living to be filled out by Dr. Glade LloydPandey.

## 2014-07-14 NOTE — Telephone Encounter (Signed)
Patient Notified that we are going to wait until appointment to fill out forms.

## 2014-07-27 ENCOUNTER — Ambulatory Visit (INDEPENDENT_AMBULATORY_CARE_PROVIDER_SITE_OTHER): Payer: Medicare Other | Admitting: Internal Medicine

## 2014-07-27 ENCOUNTER — Encounter: Payer: Self-pay | Admitting: Internal Medicine

## 2014-07-27 VITALS — BP 156/80 | HR 69 | Temp 97.6°F | Resp 18 | Ht <= 58 in | Wt 115.0 lb

## 2014-07-27 DIAGNOSIS — R197 Diarrhea, unspecified: Secondary | ICD-10-CM

## 2014-07-27 DIAGNOSIS — F0150 Vascular dementia without behavioral disturbance: Secondary | ICD-10-CM | POA: Diagnosis not present

## 2014-07-27 DIAGNOSIS — I10 Essential (primary) hypertension: Secondary | ICD-10-CM

## 2014-07-27 DIAGNOSIS — E785 Hyperlipidemia, unspecified: Secondary | ICD-10-CM

## 2014-07-27 NOTE — Patient Instructions (Signed)
STOP lipitor (cholesterol medicine)  Bring stool samples back in the next week.  Keep scheduled appt with Dr Glade LloydPandey  Continue all other medications as prescribed

## 2014-07-30 NOTE — Progress Notes (Signed)
Patient ID: Beverly Barnett, female   DOB: 12/21/1923, 81 y.o.   MRN: [id number]    Facility  [facility]    Place of Service:   OFFICE   No Known Allergies  Chief Complaint  Patient presents with  . Acute Visit    still having bowel issues     HPI:   81 yo female seen today for above. She c/o frequent loose stools, foul smelling x several mos. No recent abx use. No bloody stools. No N/V or abdominal pain. No CP, SOB, palpitations. No f but occasional chills. She has not traveled outside the [place]. No insect bites. She declined colonoscopy in the past per chart. Stopped taking aricept but have not noticed any change in loose stools. She has been seen 3 times in the last 3 mos for bowel issues.  Daughter present and providing most of hx due to pt's dementia. Family plans to take a vacation in the next several days but will return next week.   Medications: Patient's Medications  New Prescriptions   No medications on file  Previous Medications   ATORVASTATIN (LIPITOR) 10 MG TABLET    Take 1 tablet (10 mg total) by mouth daily.   B COMPLEX VITAMINS (B-COMPLEX/B-12) TABS    Take 1 tablet by mouth daily.   COENZYME Q10 (CO Q 10) 100 MG CAPS    Take 1 tablet by mouth 2 (two) times daily.   DONEPEZIL (ARICEPT) 5 MG TABLET    Take 1 tablet (5 mg total) by mouth at bedtime.   LOSARTAN (COZAAR) 50 MG TABLET    TAKE ONE TABLET BY MOUTH EVERY DAY FOR BLOOD PRESSURE   MELATONIN 3 MG CAPS    Take 1 capsule (3 mg total) by mouth at bedtime and may repeat dose one time if needed.   METOPROLOL (LOPRESSOR) 50 MG TABLET    TAKE ONE TABLET BY MOUTH TWICE DAILY   MULTIPLE VITAMIN (MULITIVITAMIN WITH MINERALS) TABS    Take 1 tablet by mouth daily. 2 in am, 1 in pm   PHOSPHATIDYLSERINE-DHA-EPA 100-19.5-6.5 MG CAPS    Take 1 capsule by mouth daily.  Modified Medications   No medications on file  Discontinued Medications   No medications on file     Review of Systems As above. Unable to obtain complete ROS due to  pt's dementia  Filed Vitals:   07/27/14 1608  BP: 156/80  Pulse: 69  Temp: 97.6 F (36.4 C)  TempSrc: Oral  Resp: 18  Height: 4\' 10"  (1.473 m)  Weight: 115 lb (52.164 kg)  SpO2: 96%   Body mass index is 24.04 kg/(m^2).  Physical Exam CONSTITUTIONAL: Looks frail in NAD. Awake and alert and  HEENT: PERRLA. No scleral icterus. Oropharynx clear and without exudate. MMM NECK: Supple. Nontender. No palpable cervical or supraclavicular lymph nodes. No carotid bruit b/l. No thyromegaly or thyroid mass palpable.  CVS: Regular rate without murmur, gallop or rub. LUNGS: CTA b/l no wheezing, rales or rhonchi. ABDOMEN: Bowel sounds hyperactive x 4. Soft, nontender, nondistended. No palpable mass or bruit. No hepatomegaly EXTREMITIES: No edema b/l. Distal pulses palpable. No calf tenderness PSYCH: Affect, behavior and mood normal   Labs reviewed: Office Visit on 06/27/2014  Component Date Value Ref Range Status  . Glucose 06/27/2014 91  65 - 99 mg/dL Final  . BUN 14/78/295601/18/2016 21  8 - 27 mg/dL Final  . Creatinine, Ser 06/27/2014 0.91  0.57 - 1.00 mg/dL Final  . GFR calc non Af  Amer 06/27/2014 57* >59 mL/min/1.73 Final  . GFR calc Af Amer 06/27/2014 65  >59 mL/min/1.73 Final  . BUN/Creatinine Ratio 06/27/2014 23  11 - 26 Final  . Sodium 06/27/2014 143  134 - 144 mmol/L Final  . Potassium 06/27/2014 4.5  3.5 - 5.2 mmol/L Final  . Chloride 06/27/2014 103  97 - 108 mmol/L Final  . CO2 06/27/2014 25  18 - 29 mmol/L Final  . Calcium 06/27/2014 9.3  8.7 - 10.3 mg/dL Final  . Total Protein 06/27/2014 6.9  6.0 - 8.5 g/dL Final  . Albumin 16/03/9603 4.3  3.5 - 4.7 g/dL Final  . Globulin, Total 06/27/2014 2.6  1.5 - 4.5 g/dL Final  . Albumin/Globulin Ratio 06/27/2014 1.7  1.1 - 2.5 Final  . Total Bilirubin 06/27/2014 0.6  0.0 - 1.2 mg/dL Final  . Alkaline Phosphatase 06/27/2014 88  39 - 117 IU/L Final  . AST 06/27/2014 22  0 - 40 IU/L Final  . ALT 06/27/2014 12  0 - 32 IU/L Final  . Amylase  06/27/2014 89  31 - 124 U/L Final  . Lipase 06/27/2014 44  0 - 59 U/L Final   Chart reviewed  Assessment/Plan   ICD-9-CM ICD-10-CM   1. Diarrhea of questionable etiology 787.91 R19.7 Cdiff NAA+O+P+Stool Culture     Campylobacter culture, stool  2. Vascular dementia, without behavioral disturbance 290.40 F01.50   3. Hyperlipidemia with target LDL less than 130 on statin- uncontrolled 272.4 E78.5   4. Essential hypertension, benign - borderline controlled 401.1 I10    --reviewed med list for causitive agents of diarrhea - d/c lipitor for now. Recommend strict heart healthy diet with low fat low cholesterol meals.  --since she is going out-of-town, she will need to bring stool samples back in the next week  --keep scheduled appt with Dr Glade Lloyd  --continue all other medications as rx  --further recommendations to follow once labs resulted  [facility] S. Ancil Linsey  [facility] [address] [place], [place] [address] [phone] Office (Wednesdays and Fridays 8 AM - 5 PM) [phone] Cell (Monday-Friday 8 AM - 5 PM)

## 2014-08-02 ENCOUNTER — Encounter: Payer: Medicare Other | Admitting: Internal Medicine

## 2014-08-03 DIAGNOSIS — H40023 Open angle with borderline findings, high risk, bilateral: Secondary | ICD-10-CM | POA: Diagnosis not present

## 2014-08-04 ENCOUNTER — Other Ambulatory Visit: Payer: Medicare Other

## 2014-08-04 DIAGNOSIS — R197 Diarrhea, unspecified: Secondary | ICD-10-CM

## 2014-08-04 NOTE — Addendum Note (Signed)
Addended by: Maurice SmallBEATTY, Leilany Digeronimo C on: 08/04/2014 09:29 AM   Modules accepted: Orders

## 2014-08-08 ENCOUNTER — Telehealth: Payer: Self-pay | Admitting: *Deleted

## 2014-08-08 LAB — CAMPYLOBACTER CULTURE, STOOL

## 2014-08-08 NOTE — Telephone Encounter (Signed)
Daughter, Amy called and wanted the test results recently done. Results are still in Preliminary. Daughter Notified.

## 2014-08-09 ENCOUNTER — Telehealth: Payer: Self-pay | Admitting: *Deleted

## 2014-08-09 NOTE — Telephone Encounter (Signed)
-----   Message from [facility] Shamsid-Deen Carter, [place] sent at 08/08/2014  3:13 PM EST ----- Stool tests neg for infection. Is she still having loose stools?

## 2014-08-09 NOTE — Telephone Encounter (Signed)
Recommend she continues bulking agents and follow up as scheduled

## 2014-08-09 NOTE — Telephone Encounter (Signed)
Spoke with patient regarding her stool test, informed her that it came back negative and she stated that her stools are soft.

## 2014-08-10 DIAGNOSIS — R278 Other lack of coordination: Secondary | ICD-10-CM | POA: Diagnosis not present

## 2014-08-10 DIAGNOSIS — M6281 Muscle weakness (generalized): Secondary | ICD-10-CM | POA: Diagnosis not present

## 2014-08-10 DIAGNOSIS — R260 Ataxic gait: Secondary | ICD-10-CM | POA: Diagnosis not present

## 2014-08-10 DIAGNOSIS — R279 Unspecified lack of coordination: Secondary | ICD-10-CM | POA: Diagnosis not present

## 2014-08-12 ENCOUNTER — Encounter: Payer: Self-pay | Admitting: Internal Medicine

## 2014-08-12 ENCOUNTER — Ambulatory Visit (INDEPENDENT_AMBULATORY_CARE_PROVIDER_SITE_OTHER): Payer: Medicare Other | Admitting: Internal Medicine

## 2014-08-12 VITALS — BP 130/80 | HR 60 | Temp 97.5°F | Resp 18 | Ht <= 58 in | Wt 114.0 lb

## 2014-08-12 DIAGNOSIS — R197 Diarrhea, unspecified: Secondary | ICD-10-CM

## 2014-08-12 DIAGNOSIS — F0150 Vascular dementia without behavioral disturbance: Secondary | ICD-10-CM | POA: Diagnosis not present

## 2014-08-12 MED ORDER — DIPHENOXYLATE-ATROPINE 2.5-0.025 MG PO TABS: 1.0000 | ORAL_TABLET | Freq: Four times a day (QID) | ORAL | Status: DC | PRN

## 2014-08-12 NOTE — Progress Notes (Signed)
Patient ID: Beverly Barnett, female   DOB: 12/14/1923, 81 y.o.   MRN: [id number]    Facility  [facility]    Place of Service:   OFFICE   No Known Allergies  Chief Complaint  Patient presents with  . Acute Visit    still having diarrhea    HPI:  81 yo female seen today for diarrhea. Her stool results were negative. She had an episode of aphasia prior to leaving for vacation that resolved after several minutes. This occurred after getting hair done at hair salon. No further sx's.  Pt feels stools are getting better but she has only had 1 formed stool in last several weeks. No bloody stools. No abdominal pain. N/V. She is a poor historian due to dmentia. Hx obtained from daughter.   She did begin PT at ALF. They are requesting OT as well and will be faxing form to office per daughter  Past Medical History  Diagnosis Date  . Hypertension   . High cholesterol   . Osteoporosis   . Type II or unspecified type diabetes mellitus without mention of complication, not stated as uncontrolled   . Hyperosmolality and/or hypernatremia   . Dysphagia, unspecified(787.20)   . Other atopic dermatitis and related conditions   . Encounter for long-term (current) use of other medications   . Cardiac catheterisation as the cause of abnormal reaction of patient, or of later complication 10/05/2006  . Vascular dementia 06/22/2013   Past Surgical History  Procedure Laterality Date  . Femur surgery Right 03/2010  . Shoulder surgery Left 03/2010  . Femur im nail  07/24/2011    Procedure: INTRAMEDULLARY (IM) NAIL FEMORAL;  Surgeon: Javier DockerJeffrey C Beane, MD;  Location: [facility];  Service: Orthopedics;  Laterality: Right;  Biomet, c-arm, Jackson table  . Intramedullary (im) nail intertrochanteric Left 07/30/2013    Procedure: INTRAMEDULLARY (IM) NAIL INTERTROCHANTRIC LEFT;  Surgeon: Shelda PalMatthew D Olin, MD;  Location: [facility];  Service: Orthopedics;  Laterality: Left;   History   Social History  . Marital Status: Widowed   Spouse Name: N/A  . Number of Children: N/A  . Years of Education: N/A   Social History Main Topics  . Smoking status: Never Smoker   . Smokeless tobacco: Not on file  . Alcohol Use: No  . Drug Use: No  . Sexual Activity: Not on file   Other Topics Concern  . None   Social History Narrative     Medications: Patient's Medications  New Prescriptions   DIPHENOXYLATE-ATROPINE (LOMOTIL) 2.5-0.025 MG PER TABLET    Take 1 tablet by mouth 4 (four) times daily as needed for diarrhea or loose stools.  Previous Medications   ATORVASTATIN (LIPITOR) 10 MG TABLET    Take 1 tablet (10 mg total) by mouth daily.   B COMPLEX VITAMINS (B-COMPLEX/B-12) TABS    Take 1 tablet by mouth daily.   COENZYME Q10 (CO Q 10) 100 MG CAPS    Take 1 tablet by mouth 2 (two) times daily.   DONEPEZIL (ARICEPT) 5 MG TABLET    Take 1 tablet (5 mg total) by mouth at bedtime.   LOSARTAN (COZAAR) 50 MG TABLET    TAKE ONE TABLET BY MOUTH EVERY DAY FOR BLOOD PRESSURE   MELATONIN 3 MG CAPS    Take 1 capsule (3 mg total) by mouth at bedtime and may repeat dose one time if needed.   METOPROLOL (LOPRESSOR) 50 MG TABLET    TAKE ONE TABLET BY MOUTH TWICE DAILY  MULTIPLE VITAMIN (MULITIVITAMIN WITH MINERALS) TABS    Take 1 tablet by mouth daily. 2 in am, 1 in pm   PHOSPHATIDYLSERINE-DHA-EPA 100-19.5-6.5 MG CAPS    Take 1 capsule by mouth daily.  Modified Medications   No medications on file  Discontinued Medications   No medications on file     Review of Systems  Unable to perform ROS: Dementia      Filed Vitals:   08/12/14 1530  BP: 130/80  Pulse: 60  Temp: 97.5 F (36.4 C)  TempSrc: Oral  Resp: 18  Height:  (1.473 m)  Weight: 114 lb (51.71 kg)  SpO2: 97%   Body mass index is 23.83 kg/(m^2).  Physical Exam  Constitutional:  Frail appearing in NAD  Cardiovascular: Normal rate, regular rhythm, normal heart sounds and intact distal pulses.  Exam reveals no gallop and no friction rub.   No murmur  heard. No LE edema b/l. No calf TTP  Pulmonary/Chest: Effort normal and breath sounds normal. No respiratory distress. She has no wheezes. She has no rales.  Abdominal: Soft. Bowel sounds are normal. She exhibits no distension, no abdominal bruit, no pulsatile midline mass and no mass. There is no hepatomegaly. There is no tenderness. There is no rebound and no guarding.  Neurological: She is alert.  And awake     Labs reviewed: Appointment on 08/04/2014  Component Date Value Ref Range Status  . Campylobacter Culture 08/04/2014 Final report   Final  . RESULT 1 08/04/2014 Comment   Final   No Campylobacter species isolated.  . Salmonella/Shigella Screen 08/04/2014 Final report   Final  . RESULT 1 08/04/2014 Comment   Final   No Salmonella or Shigella recovered.  . Campylobacter Culture 08/04/2014 Preliminary report   Preliminary   PLEASE REFER TO [id number]  . RESULT 1 08/04/2014 Comment   Preliminary   Specimen has been received and testing has been initiated.  Office Visit on 06/27/2014  Component Date Value Ref Range Status  . Glucose 06/27/2014 91  65 - 99 mg/dL Final  . BUN 29/56/2130 21  8 - 27 mg/dL Final  . Creatinine, Ser 06/27/2014 0.91  0.57 - 1.00 mg/dL Final  . GFR calc non Af Amer 06/27/2014 57* >59 mL/min/1.73 Final  . GFR calc Af Amer 06/27/2014 65  >59 mL/min/1.73 Final  . BUN/Creatinine Ratio 06/27/2014 23  11 - 26 Final  . Sodium 06/27/2014 143  134 - 144 mmol/L Final  . Potassium 06/27/2014 4.5  3.5 - 5.2 mmol/L Final  . Chloride 06/27/2014 103  97 - 108 mmol/L Final  . CO2 06/27/2014 25  18 - 29 mmol/L Final  . Calcium 06/27/2014 9.3  8.7 - 10.3 mg/dL Final  . Total Protein 06/27/2014 6.9  6.0 - 8.5 g/dL Final  . Albumin 86/57/8469 4.3  3.5 - 4.7 g/dL Final  . Globulin, Total 06/27/2014 2.6  1.5 - 4.5 g/dL Final  . Albumin/Globulin Ratio 06/27/2014 1.7  1.1 - 2.5 Final  . Total Bilirubin 06/27/2014 0.6  0.0 - 1.2 mg/dL Final  . Alkaline Phosphatase  06/27/2014 88  39 - 117 IU/L Final  . AST 06/27/2014 22  0 - 40 IU/L Final  . ALT 06/27/2014 12  0 - 32 IU/L Final  . Amylase 06/27/2014 89  31 - 124 U/L Final  . Lipase 06/27/2014 44  0 - 59 U/L Final     Assessment/Plan   ICD-9-CM ICD-10-CM   1. Diarrhea - unchanged after stopping statin 787.91  R19.7 Ambulatory referral to Gastroenterology     diphenoxylate-atropine (LOMOTIL) 2.5-0.025 MG per tablet  2. Vascular dementia, without behavioral disturbance - stable off aricept 290.40 F01.50    --ok to resume lipitor qhs but continue holding aricept  --will call with GI referral  --continue coenzyme q10 and PTS-DHA-EPA supplement for dementia  --rx lomotil for diarrhea to take prn  --f/u in 1 month to reck sx's  Giovanni Biby S. Ancil Linsey  [facility] [address] [place], [place] [address] [phone] Office (Wednesdays and Fridays 8 AM - 5 PM) [phone] Cell (Monday-Friday 8 AM - 5 PM)

## 2014-08-12 NOTE — Patient Instructions (Addendum)
Okay to resume lipitor (atorvastatin)  Will call with GI referral  Continue all other medications as ordered

## 2014-08-13 ENCOUNTER — Encounter: Payer: Self-pay | Admitting: Internal Medicine

## 2014-08-14 LAB — CDIFF NAA+O+P+STOOL CULTURE
E COLI SHIGA TOXIN ASSAY: NEGATIVE
Toxigenic C. Difficile by PCR: NEGATIVE

## 2014-08-15 DIAGNOSIS — R260 Ataxic gait: Secondary | ICD-10-CM | POA: Diagnosis not present

## 2014-08-15 DIAGNOSIS — M6281 Muscle weakness (generalized): Secondary | ICD-10-CM | POA: Diagnosis not present

## 2014-08-15 DIAGNOSIS — R278 Other lack of coordination: Secondary | ICD-10-CM | POA: Diagnosis not present

## 2014-08-15 DIAGNOSIS — R279 Unspecified lack of coordination: Secondary | ICD-10-CM | POA: Diagnosis not present

## 2014-08-16 DIAGNOSIS — R279 Unspecified lack of coordination: Secondary | ICD-10-CM | POA: Diagnosis not present

## 2014-08-16 DIAGNOSIS — M6281 Muscle weakness (generalized): Secondary | ICD-10-CM | POA: Diagnosis not present

## 2014-08-16 DIAGNOSIS — R260 Ataxic gait: Secondary | ICD-10-CM | POA: Diagnosis not present

## 2014-08-16 DIAGNOSIS — R278 Other lack of coordination: Secondary | ICD-10-CM | POA: Diagnosis not present

## 2014-08-17 ENCOUNTER — Encounter: Payer: Self-pay | Admitting: Physician Assistant

## 2014-08-17 DIAGNOSIS — M6281 Muscle weakness (generalized): Secondary | ICD-10-CM | POA: Diagnosis not present

## 2014-08-17 DIAGNOSIS — R278 Other lack of coordination: Secondary | ICD-10-CM | POA: Diagnosis not present

## 2014-08-17 DIAGNOSIS — R260 Ataxic gait: Secondary | ICD-10-CM | POA: Diagnosis not present

## 2014-08-17 DIAGNOSIS — R279 Unspecified lack of coordination: Secondary | ICD-10-CM | POA: Diagnosis not present

## 2014-08-18 DIAGNOSIS — R278 Other lack of coordination: Secondary | ICD-10-CM | POA: Diagnosis not present

## 2014-08-18 DIAGNOSIS — R279 Unspecified lack of coordination: Secondary | ICD-10-CM | POA: Diagnosis not present

## 2014-08-18 DIAGNOSIS — R260 Ataxic gait: Secondary | ICD-10-CM | POA: Diagnosis not present

## 2014-08-18 DIAGNOSIS — M6281 Muscle weakness (generalized): Secondary | ICD-10-CM | POA: Diagnosis not present

## 2014-08-19 DIAGNOSIS — R278 Other lack of coordination: Secondary | ICD-10-CM | POA: Diagnosis not present

## 2014-08-19 DIAGNOSIS — M6281 Muscle weakness (generalized): Secondary | ICD-10-CM | POA: Diagnosis not present

## 2014-08-19 DIAGNOSIS — R260 Ataxic gait: Secondary | ICD-10-CM | POA: Diagnosis not present

## 2014-08-19 DIAGNOSIS — R279 Unspecified lack of coordination: Secondary | ICD-10-CM | POA: Diagnosis not present

## 2014-08-23 DIAGNOSIS — R260 Ataxic gait: Secondary | ICD-10-CM | POA: Diagnosis not present

## 2014-08-23 DIAGNOSIS — M6281 Muscle weakness (generalized): Secondary | ICD-10-CM | POA: Diagnosis not present

## 2014-08-23 DIAGNOSIS — R278 Other lack of coordination: Secondary | ICD-10-CM | POA: Diagnosis not present

## 2014-08-23 DIAGNOSIS — R279 Unspecified lack of coordination: Secondary | ICD-10-CM | POA: Diagnosis not present

## 2014-08-24 ENCOUNTER — Other Ambulatory Visit: Payer: Medicare Other

## 2014-08-24 ENCOUNTER — Other Ambulatory Visit (INDEPENDENT_AMBULATORY_CARE_PROVIDER_SITE_OTHER): Payer: Medicare Other

## 2014-08-24 ENCOUNTER — Encounter: Payer: Self-pay | Admitting: Physician Assistant

## 2014-08-24 ENCOUNTER — Ambulatory Visit (INDEPENDENT_AMBULATORY_CARE_PROVIDER_SITE_OTHER): Payer: Medicare Other | Admitting: Physician Assistant

## 2014-08-24 VITALS — BP 130/76 | HR 92 | Ht 60.0 in | Wt 115.6 lb

## 2014-08-24 DIAGNOSIS — R260 Ataxic gait: Secondary | ICD-10-CM | POA: Diagnosis not present

## 2014-08-24 DIAGNOSIS — R197 Diarrhea, unspecified: Secondary | ICD-10-CM

## 2014-08-24 DIAGNOSIS — R279 Unspecified lack of coordination: Secondary | ICD-10-CM | POA: Diagnosis not present

## 2014-08-24 DIAGNOSIS — R278 Other lack of coordination: Secondary | ICD-10-CM | POA: Diagnosis not present

## 2014-08-24 DIAGNOSIS — M6281 Muscle weakness (generalized): Secondary | ICD-10-CM | POA: Diagnosis not present

## 2014-08-24 LAB — CBC WITH DIFFERENTIAL/PLATELET
BASOS ABS: 0 10*3/uL (ref 0.0–0.1)
Basophils Relative: 0.6 % (ref 0.0–3.0)
Eosinophils Absolute: 0.2 10*3/uL (ref 0.0–0.7)
Eosinophils Relative: 2.1 % (ref 0.0–5.0)
HEMATOCRIT: 36.6 % (ref 36.0–46.0)
HEMOGLOBIN: 12.2 g/dL (ref 12.0–15.0)
LYMPHS ABS: 2.9 10*3/uL (ref 0.7–4.0)
Lymphocytes Relative: 38.1 % (ref 12.0–46.0)
MCHC: 33.3 g/dL (ref 30.0–36.0)
MCV: 90.6 fl (ref 78.0–100.0)
MONOS PCT: 9.6 % (ref 3.0–12.0)
Monocytes Absolute: 0.7 10*3/uL (ref 0.1–1.0)
Neutro Abs: 3.7 10*3/uL (ref 1.4–7.7)
Neutrophils Relative %: 49.6 % (ref 43.0–77.0)
Platelets: 194 10*3/uL (ref 150.0–400.0)
RBC: 4.04 Mil/uL (ref 3.87–5.11)
RDW: 14.4 % (ref 11.5–15.5)
WBC: 7.6 10*3/uL (ref 4.0–10.5)

## 2014-08-24 LAB — BASIC METABOLIC PANEL
BUN: 22 mg/dL (ref 6–23)
CHLORIDE: 108 meq/L (ref 96–112)
CO2: 28 mEq/L (ref 19–32)
Calcium: 9.4 mg/dL (ref 8.4–10.5)
Creatinine, Ser: 0.93 mg/dL (ref 0.40–1.20)
GFR: 60.4 mL/min (ref >60.00)
Glucose, Bld: 103 mg/dL — ABNORMAL HIGH (ref 70–99)
POTASSIUM: 4.1 meq/L (ref 3.5–5.1)
SODIUM: 142 meq/L (ref 135–145)

## 2014-08-24 MED ORDER — SACCHAROMYCES BOULARDII 250 MG PO CAPS: 250.0000 mg | ORAL_CAPSULE | Freq: Two times a day (BID) | ORAL | Status: DC

## 2014-08-24 MED ORDER — METRONIDAZOLE 250 MG PO TABS: 250.0000 mg | ORAL_TABLET | Freq: Four times a day (QID) | ORAL | Status: AC

## 2014-08-24 NOTE — Patient Instructions (Addendum)
Please go to the basement level to have your labs drawn and stool study. [facility] [address]

## 2014-08-24 NOTE — Progress Notes (Addendum)
Patient ID: Beverly Barnett, female   DOB: 08/02/1923, 81 y.o.   MRN: [id number]   Subjective:    Patient ID: Beverly Barnett, female    DOB: 09/10/1923, 81 y.o.   MRN: [id number]  HPI Beverly Barnett is a pleasant 81 year old white female new to GI today referred by [facility] Dr. Montez Moritaarter for evaluation of persistent diarrhea. Patient's daughter offers most of the history. Beverly Barnett has a mild vascular dementia. She also has history of hypertension osteoporosis hyperlipidemia and prior left hip fracture. She does not believe that she's had her had a prior colonoscopy. Her problems with diarrhea starting in November 2015 and have been persistent. She has had several visits with [facility] and had stool for C. difficile toxin and stool for Campylobacter both of which were negative in February 2016. She has been tried on fiber and probiotics, Imodium and is now on Lomotil. She has not had any worsening of her diarrhea but is consistently having at least to liquid bowel movements daily which her daughter says are very malodorous. Prior to November she had not had any problems with diarrhea. They do not remember her taking any courses of antibiotics though she does take antibiotics prior to going to the dentist which she does 2 or 3 times a year. She had been taken off of Aricept and also off Lipitor but has not noticed any change in the diarrhea. She has no complaints of abdominal pain or cramping appetite has been fine and weight has been stable no fever or chills. He has had urgency and episodes of incontinence and is now wearing depends. Most recently she was started on Lomotil, patient is living at habits would in her daughter states she's not sure how many times per day she's taking the Lomotil but this hasn't seemed to change the number of bowel movements per day.  Review of Systems Pertinent positive and negative review of systems were noted in the above HPI section.  All other review of systems was otherwise  negative.  Outpatient Encounter Prescriptions as of 08/24/2014  Medication Sig  . atorvastatin (LIPITOR) 10 MG tablet Take 1 tablet (10 mg total) by mouth daily.  . B Complex Vitamins (B-COMPLEX/B-12) TABS Take 1 tablet by mouth daily.  . Coenzyme Q10 (CO Q 10) 100 MG CAPS Take 1 tablet by mouth 2 (two) times daily.  . diphenoxylate-atropine (LOMOTIL) 2.5-0.025 MG per tablet Take 1 tablet by mouth 4 (four) times daily as needed for diarrhea or loose stools.  Marland Kitchen. losartan (COZAAR) 50 MG tablet TAKE ONE TABLET BY MOUTH EVERY DAY FOR BLOOD PRESSURE  . Melatonin 3 MG CAPS Take 1 capsule (3 mg total) by mouth at bedtime and may repeat dose one time if needed.  . metoprolol (LOPRESSOR) 50 MG tablet TAKE ONE TABLET BY MOUTH TWICE DAILY  . Multiple Vitamin (MULITIVITAMIN WITH MINERALS) TABS Take 1 tablet by mouth daily. 2 in am, 1 in pm  . Phosphatidylserine-DHA-EPA 100-19.5-6.5 MG CAPS Take 1 capsule by mouth daily.  Marland Kitchen. donepezil (ARICEPT) 5 MG tablet Take 1 tablet (5 mg total) by mouth at bedtime. (Patient not taking: Reported on 07/27/2014)  . metroNIDAZOLE (FLAGYL) 250 MG tablet Take 1 tablet (250 mg total) by mouth 4 (four) times daily.  Marland Kitchen. saccharomyces boulardii (FLORASTOR) 250 MG capsule Take 1 capsule (250 mg total) by mouth 2 (two) times daily.   No Known Allergies Patient Active Problem List   Diagnosis Date Noted  . 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  Insomnia 01/11/2014  . Dementia, vascular, mixed 10/13/2013  . Left leg weakness 10/13/2013  . Intertrochanteric fracture of left hip 07/29/2013  . Anemia 07/29/2013  . Hip fracture, left 07/29/2013  . Vascular dementia 06/22/2013  . Essential hypertension, benign 06/22/2013  . Hyperlipidemia with target LDL less than 130 06/22/2013  . Other and unspecified hyperlipidemia 02/16/2013  . Osteoporosis, unspecified 01/05/2013  . Dementia 01/05/2013  . Hypertension   . High cholesterol    History   Social History  . Marital Status: Widowed      Spouse Name: N/A  . Number of Children: N/A  . Years of Education: N/A   Occupational History  . Not on file.   Social History Main Topics  . Smoking status: Never Smoker   . Smokeless tobacco: Not on file  . Alcohol Use: No  . Drug Use: No  . Sexual Activity: Not on file   Other Topics Concern  . Not on file   Social History Narrative    Ms. Schramm's family history includes Cancer in her sister; Heart disease in her father.      Objective:    Filed Vitals:   08/24/14 0854  BP: 130/76  Pulse: 92    Physical Exam  well-developed elderly white female in no acute distress ambulates with a walker and accompanied by her daughter. Blood pressure 130/76 pulse 92 height 5 foot weight 1:15. HEENT; nontraumatic normocephalic EOMI PERRLA sclera anicteric, Supple ;no JVD, Cardiovascular; regular rate and rhythm with S1-S2 she does have a systolic murmur at pulmonary clear bilaterally, Abdomen; soft, nontender nondistended no palpable mass or hepatosplenomegaly bowel sounds are present, Rectal ;exam not done, Extremities; no clubbing cyanosis or edema skin warm and dry, Psych; mood and affect appropriate       Assessment & Plan:   #1 80 yo female with persistent Diarrhea x 4 months . Etiology unclear, no change after discontinuing Aricept #2 Vascular Dementia #3 HTN #4 Osteoporosis  Plan; CBC, BMET  GI pathogen panel,lactoferrin Start empiiric course of metronidazole 250 mg 4 times daily x 14 days Continue Florastor BID x one month Stop Lomotil for now Will hope to avoid endoscopic evaluation given advanced age Further plan pending response  to above. Pt will be established with Dr .Beverly Barnett Beverly Nihiser PA-C 08/24/2014   Cc: Beverly Boys, DO  Addendum: Reviewed and agree with initial management. Beverly Fiedler, MD

## 2014-08-25 DIAGNOSIS — M6281 Muscle weakness (generalized): Secondary | ICD-10-CM | POA: Diagnosis not present

## 2014-08-25 DIAGNOSIS — R260 Ataxic gait: Secondary | ICD-10-CM | POA: Diagnosis not present

## 2014-08-25 DIAGNOSIS — R279 Unspecified lack of coordination: Secondary | ICD-10-CM | POA: Diagnosis not present

## 2014-08-25 DIAGNOSIS — R278 Other lack of coordination: Secondary | ICD-10-CM | POA: Diagnosis not present

## 2014-08-25 LAB — FECAL LACTOFERRIN, QUANT: LACTOFERRIN: POSITIVE

## 2014-08-26 DIAGNOSIS — R278 Other lack of coordination: Secondary | ICD-10-CM | POA: Diagnosis not present

## 2014-08-26 DIAGNOSIS — R260 Ataxic gait: Secondary | ICD-10-CM | POA: Diagnosis not present

## 2014-08-26 DIAGNOSIS — M6281 Muscle weakness (generalized): Secondary | ICD-10-CM | POA: Diagnosis not present

## 2014-08-26 DIAGNOSIS — R279 Unspecified lack of coordination: Secondary | ICD-10-CM | POA: Diagnosis not present

## 2014-08-26 LAB — GASTROINTESTINAL PATHOGEN PANEL PCR
C. difficile Tox A/B, PCR: NEGATIVE
Campylobacter, PCR: NEGATIVE
Cryptosporidium, PCR: NEGATIVE
E COLI (ETEC) LT/ST, PCR: NEGATIVE
E COLI 0157, PCR: NEGATIVE
E coli (STEC) stx1/stx2, PCR: NEGATIVE
Giardia lamblia, PCR: NEGATIVE
NOROVIRUS, PCR: NEGATIVE
Rotavirus A, PCR: NEGATIVE
SALMONELLA, PCR: NEGATIVE
SHIGELLA, PCR: NEGATIVE

## 2014-08-29 DIAGNOSIS — R278 Other lack of coordination: Secondary | ICD-10-CM | POA: Diagnosis not present

## 2014-08-29 DIAGNOSIS — R279 Unspecified lack of coordination: Secondary | ICD-10-CM | POA: Diagnosis not present

## 2014-08-29 DIAGNOSIS — M6281 Muscle weakness (generalized): Secondary | ICD-10-CM | POA: Diagnosis not present

## 2014-08-29 DIAGNOSIS — R260 Ataxic gait: Secondary | ICD-10-CM | POA: Diagnosis not present

## 2014-08-30 DIAGNOSIS — R279 Unspecified lack of coordination: Secondary | ICD-10-CM | POA: Diagnosis not present

## 2014-08-30 DIAGNOSIS — R278 Other lack of coordination: Secondary | ICD-10-CM | POA: Diagnosis not present

## 2014-08-30 DIAGNOSIS — R260 Ataxic gait: Secondary | ICD-10-CM | POA: Diagnosis not present

## 2014-08-30 DIAGNOSIS — M6281 Muscle weakness (generalized): Secondary | ICD-10-CM | POA: Diagnosis not present

## 2014-08-31 DIAGNOSIS — R260 Ataxic gait: Secondary | ICD-10-CM | POA: Diagnosis not present

## 2014-08-31 DIAGNOSIS — R279 Unspecified lack of coordination: Secondary | ICD-10-CM | POA: Diagnosis not present

## 2014-08-31 DIAGNOSIS — M6281 Muscle weakness (generalized): Secondary | ICD-10-CM | POA: Diagnosis not present

## 2014-08-31 DIAGNOSIS — R278 Other lack of coordination: Secondary | ICD-10-CM | POA: Diagnosis not present

## 2014-09-01 ENCOUNTER — Encounter: Payer: Self-pay | Admitting: Physician Assistant

## 2014-09-01 DIAGNOSIS — R279 Unspecified lack of coordination: Secondary | ICD-10-CM | POA: Diagnosis not present

## 2014-09-01 DIAGNOSIS — R278 Other lack of coordination: Secondary | ICD-10-CM | POA: Diagnosis not present

## 2014-09-01 DIAGNOSIS — R260 Ataxic gait: Secondary | ICD-10-CM | POA: Diagnosis not present

## 2014-09-01 DIAGNOSIS — M6281 Muscle weakness (generalized): Secondary | ICD-10-CM | POA: Diagnosis not present

## 2014-09-01 NOTE — Telephone Encounter (Signed)
Doc of the day- Please advise on how this should be handled.

## 2014-09-02 DIAGNOSIS — R260 Ataxic gait: Secondary | ICD-10-CM | POA: Diagnosis not present

## 2014-09-02 DIAGNOSIS — R278 Other lack of coordination: Secondary | ICD-10-CM | POA: Diagnosis not present

## 2014-09-02 DIAGNOSIS — M6281 Muscle weakness (generalized): Secondary | ICD-10-CM | POA: Diagnosis not present

## 2014-09-02 DIAGNOSIS — R279 Unspecified lack of coordination: Secondary | ICD-10-CM | POA: Diagnosis not present

## 2014-09-04 ENCOUNTER — Encounter (HOSPITAL_COMMUNITY): Payer: Self-pay | Admitting: Emergency Medicine

## 2014-09-04 ENCOUNTER — Emergency Department (HOSPITAL_COMMUNITY)
Admission: EM | Admit: 2014-09-04 | Discharge: 2014-09-04 | Disposition: A | Discharge status: Home / Self Care | Payer: Medicare Other | Attending: Emergency Medicine | Admitting: Emergency Medicine

## 2014-09-04 ENCOUNTER — Emergency Department (HOSPITAL_COMMUNITY): Payer: Medicare Other

## 2014-09-04 DIAGNOSIS — E780 Pure hypercholesterolemia: Secondary | ICD-10-CM | POA: Diagnosis not present

## 2014-09-04 DIAGNOSIS — I10 Essential (primary) hypertension: Secondary | ICD-10-CM | POA: Diagnosis not present

## 2014-09-04 DIAGNOSIS — Y9289 Other specified places as the place of occurrence of the external cause: Secondary | ICD-10-CM | POA: Diagnosis not present

## 2014-09-04 DIAGNOSIS — E119 Type 2 diabetes mellitus without complications: Secondary | ICD-10-CM | POA: Insufficient documentation

## 2014-09-04 DIAGNOSIS — B9689 Other specified bacterial agents as the cause of diseases classified elsewhere: Secondary | ICD-10-CM | POA: Diagnosis not present

## 2014-09-04 DIAGNOSIS — Z792 Long term (current) use of antibiotics: Secondary | ICD-10-CM | POA: Diagnosis not present

## 2014-09-04 DIAGNOSIS — N390 Urinary tract infection, site not specified: Secondary | ICD-10-CM | POA: Insufficient documentation

## 2014-09-04 DIAGNOSIS — R29898 Other symptoms and signs involving the musculoskeletal system: Secondary | ICD-10-CM | POA: Diagnosis not present

## 2014-09-04 DIAGNOSIS — W1839XA Other fall on same level, initial encounter: Secondary | ICD-10-CM | POA: Insufficient documentation

## 2014-09-04 DIAGNOSIS — Z872 Personal history of diseases of the skin and subcutaneous tissue: Secondary | ICD-10-CM | POA: Diagnosis not present

## 2014-09-04 DIAGNOSIS — Z79899 Other long term (current) drug therapy: Secondary | ICD-10-CM | POA: Diagnosis not present

## 2014-09-04 DIAGNOSIS — R410 Disorientation, unspecified: Secondary | ICD-10-CM | POA: Diagnosis not present

## 2014-09-04 DIAGNOSIS — F0150 Vascular dementia without behavioral disturbance: Secondary | ICD-10-CM | POA: Insufficient documentation

## 2014-09-04 DIAGNOSIS — Z043 Encounter for examination and observation following other accident: Secondary | ICD-10-CM | POA: Diagnosis not present

## 2014-09-04 DIAGNOSIS — Y9389 Activity, other specified: Secondary | ICD-10-CM | POA: Insufficient documentation

## 2014-09-04 DIAGNOSIS — R4182 Altered mental status, unspecified: Secondary | ICD-10-CM | POA: Diagnosis not present

## 2014-09-04 DIAGNOSIS — R269 Unspecified abnormalities of gait and mobility: Secondary | ICD-10-CM | POA: Diagnosis not present

## 2014-09-04 DIAGNOSIS — R9431 Abnormal electrocardiogram [ECG] [EKG]: Secondary | ICD-10-CM | POA: Diagnosis not present

## 2014-09-04 DIAGNOSIS — I639 Cerebral infarction, unspecified: Secondary | ICD-10-CM

## 2014-09-04 LAB — I-STAT CHEM 8, ED
BUN: 26 mg/dL — ABNORMAL HIGH (ref 6–23)
Calcium, Ion: 1.13 mmol/L (ref 1.13–1.30)
Chloride: 92 mmol/L — ABNORMAL LOW (ref 96–112)
Creatinine, Ser: 0.7 mg/dL (ref 0.50–1.10)
Glucose, Bld: 145 mg/dL — ABNORMAL HIGH (ref 70–99)
HEMATOCRIT: 39 % (ref 36.0–46.0)
HEMOGLOBIN: 13.3 g/dL (ref 12.0–15.0)
Potassium: 3.8 mmol/L (ref 3.5–5.1)
SODIUM: 144 mmol/L (ref 135–145)
TCO2: 21 mmol/L (ref 0–100)

## 2014-09-04 LAB — COMPREHENSIVE METABOLIC PANEL
ALK PHOS: 48 U/L (ref 39–117)
ALT: 25 U/L (ref 0–35)
AST: 32 U/L (ref 0–37)
Albumin: 3.7 g/dL (ref 3.5–5.2)
Anion gap: 8 (ref 5–15)
BUN: 26 mg/dL — ABNORMAL HIGH (ref 6–23)
CO2: 25 mmol/L (ref 19–32)
CREATININE: 0.84 mg/dL (ref 0.50–1.10)
Calcium: 8.9 mg/dL (ref 8.4–10.5)
Chloride: 110 mmol/L (ref 96–112)
GFR calc Af Amer: 70 mL/min — ABNORMAL LOW (ref >90)
GFR calc non Af Amer: 60 mL/min — ABNORMAL LOW (ref >90)
GLUCOSE: 144 mg/dL — AB (ref 70–99)
Potassium: 3.9 mmol/L (ref 3.5–5.1)
Sodium: 143 mmol/L (ref 135–145)
Total Bilirubin: 0.6 mg/dL (ref 0.3–1.2)
Total Protein: 6.7 g/dL (ref 6.0–8.3)

## 2014-09-04 LAB — DIFFERENTIAL
BASOS PCT: 0 % (ref 0–1)
Basophils Absolute: 0 10*3/uL (ref 0.0–0.1)
EOS PCT: 1 % (ref 0–5)
Eosinophils Absolute: 0.1 10*3/uL (ref 0.0–0.7)
Lymphocytes Relative: 20 % (ref 12–46)
Lymphs Abs: 2.1 10*3/uL (ref 0.7–4.0)
MONO ABS: 0.9 10*3/uL (ref 0.1–1.0)
Monocytes Relative: 9 % (ref 3–12)
Neutro Abs: 7.3 10*3/uL (ref 1.7–7.7)
Neutrophils Relative %: 70 % (ref 43–77)

## 2014-09-04 LAB — PROTIME-INR
INR: 1.1 (ref 0.00–1.49)
Prothrombin Time: 14.4 seconds (ref 11.6–15.2)

## 2014-09-04 LAB — CBC
HCT: 39 % (ref 36.0–46.0)
Hemoglobin: 12.7 g/dL (ref 12.0–15.0)
MCH: 30.8 pg (ref 26.0–34.0)
MCHC: 32.6 g/dL (ref 30.0–36.0)
MCV: 94.4 fL (ref 78.0–100.0)
Platelets: 214 10*3/uL (ref 150–400)
RBC: 4.13 MIL/uL (ref 3.87–5.11)
RDW: 14.5 % (ref 11.5–15.5)
WBC: 10.4 10*3/uL (ref 4.0–10.5)

## 2014-09-04 LAB — URINALYSIS, ROUTINE W REFLEX MICROSCOPIC
Bilirubin Urine: NEGATIVE
Glucose, UA: NEGATIVE mg/dL
Hgb urine dipstick: NEGATIVE
KETONES UR: NEGATIVE mg/dL
Nitrite: POSITIVE — AB
Protein, ur: NEGATIVE mg/dL
SPECIFIC GRAVITY, URINE: 1.019 (ref 1.005–1.030)
Urobilinogen, UA: 0.2 mg/dL (ref 0.0–1.0)
pH: 6.5 (ref 5.0–8.0)

## 2014-09-04 LAB — CBG MONITORING, ED: GLUCOSE-CAPILLARY: 150 mg/dL — AB (ref 70–99)

## 2014-09-04 LAB — URINE MICROSCOPIC-ADD ON

## 2014-09-04 LAB — I-STAT TROPONIN, ED: TROPONIN I, POC: 0 ng/mL (ref 0.00–0.08)

## 2014-09-04 LAB — APTT: APTT: 29 s (ref 24–37)

## 2014-09-04 MED ORDER — DEXTROSE 5 % IV SOLN
1.0000 g | Freq: Once | INTRAVENOUS | Status: AC
  Administered 2014-09-04: 1 g via INTRAVENOUS
  Filled 2014-09-04: qty 10

## 2014-09-04 MED ORDER — ASPIRIN 81 MG PO CHEW
324.0000 mg | CHEWABLE_TABLET | Freq: Once | ORAL | Status: AC
  Administered 2014-09-04: 324 mg via ORAL
  Filled 2014-09-04: qty 4

## 2014-09-04 MED ORDER — CEPHALEXIN 500 MG PO CAPS: 500.0000 mg | ORAL_CAPSULE | Freq: Three times a day (TID) | ORAL | Status: DC

## 2014-09-04 NOTE — ED Provider Notes (Signed)
CSN: [id number]     Arrival date & time 09/04/14  0935 History   First MD Initiated Contact with Patient 09/04/14 [phone]     Chief Complaint  Patient presents with  . Fall  . Altered Mental Status      Patient is a 81 y.o. female presenting with fall and altered mental status.  Fall  Altered Mental Status  Pt states that she fell in her bedroom this morning (unsure of exact time). Unsure of exact circumstances surrounding fall but "thinks she tripped". Pt states that since the fall, she has been feeling "confused". Pt has what appears to be dried blood on her shirt but states that she spilled a boost. Family states that fall was last night. States that she was walking sideways yesterday. States that pt has been leaning to the left. Appeared very confused and was "rambling" when family went to pick her up for church this morning. Clear speech, symmetrical face, but has trouble finding her words.  Past Medical History  Diagnosis Date  . Hypertension   . High cholesterol   . Osteoporosis   . Type II or unspecified type diabetes mellitus without mention of complication, not stated as uncontrolled   . Hyperosmolality and/or hypernatremia   . Dysphagia, unspecified(787.20)   . Other atopic dermatitis and related conditions   . Encounter for long-term (current) use of other medications   . Cardiac catheterisation as the cause of abnormal reaction of patient, or of later complication 10/05/2006  . Vascular dementia 06/22/2013   Past Surgical History  Procedure Laterality Date  . Femur surgery Right 03/2010  . Shoulder surgery Left 03/2010  . Femur im nail  07/24/2011    Procedure: INTRAMEDULLARY (IM) NAIL FEMORAL;  Surgeon: Javier DockerJeffrey C Beane, MD;  Location: [facility];  Service: Orthopedics;  Laterality: Right;  Biomet, c-arm, Jackson table  . Intramedullary (im) nail intertrochanteric Left 07/30/2013    Procedure: INTRAMEDULLARY (IM) NAIL INTERTROCHANTRIC LEFT;  Surgeon: Shelda PalMatthew D Olin,  MD;  Location: [facility];  Service: Orthopedics;  Laterality: Left;   Family History  Problem Relation Age of Onset  . Cancer Sister   . Heart disease Father    History  Substance Use Topics  . Smoking status: Never Smoker   . Smokeless tobacco: Not on file  . Alcohol Use: No   OB History    No data available     Review of Systems  All other systems reviewed and are negative.     Allergies  Review of patient's allergies indicates no known allergies.  Home Medications   Prior to Admission medications   Medication Sig Start Date End Date Taking? Authorizing Provider  atorvastatin (LIPITOR) 10 MG tablet Take 1 tablet (10 mg total) by mouth daily. 02/11/14  Yes Tiffany L Reed, DO  B Complex Vitamins (B-COMPLEX/B-12) TABS Take 1 tablet by mouth daily.   Yes Historical Provider, MD  Coenzyme Q10 (CO Q 10) 100 MG CAPS Take 1 tablet by mouth 2 (two) times daily.   Yes Historical Provider, MD  diphenoxylate-atropine (LOMOTIL) 2.5-0.025 MG per tablet Take 1 tablet by mouth 4 (four) times daily as needed for diarrhea or loose stools. 08/12/14  Yes Monica Carter, DO  losartan (COZAAR) 50 MG tablet TAKE ONE TABLET BY MOUTH EVERY DAY FOR BLOOD PRESSURE 07/12/14  Yes Oneal GroutMahima Pandey, MD  Melatonin 3 MG CAPS Take 1 capsule (3 mg total) by mouth at bedtime and may repeat dose one time if needed. 01/11/14  Yes Mahima  Glade Lloyd, MD  metoprolol (LOPRESSOR) 50 MG tablet TAKE ONE TABLET BY MOUTH TWICE DAILY 04/11/14  Yes Mahima Glade Lloyd, MD  metroNIDAZOLE (FLAGYL) 250 MG tablet Take 1 tablet (250 mg total) by mouth 4 (four) times daily. 08/24/14 09/07/14 Yes Amy S Esterwood, PA-C  Multiple Vitamin (MULITIVITAMIN WITH MINERALS) TABS Take 1 tablet by mouth daily. 2 in am, 1 in pm   Yes Historical Provider, MD  Phosphatidylserine-DHA-EPA 100-19.5-6.5 MG CAPS Take 1 capsule by mouth daily.   Yes Historical Provider, MD  saccharomyces boulardii (FLORASTOR) 250 MG capsule Take 1 capsule (250 mg total) by mouth 2 (two) times  daily. 08/24/14  Yes Amy S Esterwood, PA-C  cephALEXin (KEFLEX) 500 MG capsule Take 1 capsule (500 mg total) by mouth 3 (three) times daily. 09/04/14   Nelva Nay, MD  donepezil (ARICEPT) 5 MG tablet Take 1 tablet (5 mg total) by mouth at bedtime. Patient not taking: Reported on 07/27/2014 07/20/13   Oneal Grout, MD   BP 153/63 mmHg  Pulse 88  Temp(Src) 97.6 F (36.4 C) (Oral)  Resp 18  SpO2 99% Physical Exam  Constitutional: She appears well-developed and well-nourished. No distress.  HENT:  Head: Normocephalic and atraumatic.  Eyes: Pupils are equal, round, and reactive to light.  Neck: Normal range of motion.  Cardiovascular: Normal rate and intact distal pulses.   Pulmonary/Chest: No respiratory distress.  Abdominal: Normal appearance. She exhibits no distension.  Musculoskeletal: Normal range of motion.  Neurological: She is alert. She has normal strength. No cranial nerve deficit. Coordination abnormal. GCS eye subscore is 4. GCS verbal subscore is 5. GCS motor subscore is 6.  Skin: Skin is warm and dry. No rash noted.  Psychiatric: She has a normal mood and affect. Her speech is normal and behavior is normal. Cognition and memory are impaired.  Nursing note and vitals reviewed.   ED Course  Procedures (including critical care time) Labs Review Labs Reviewed  COMPREHENSIVE METABOLIC PANEL - Abnormal; Notable for the following:    Glucose, Bld 144 (*)    BUN 26 (*)    GFR calc non Af Amer 60 (*)    GFR calc Af Amer 70 (*)    All other components within normal limits  URINALYSIS, ROUTINE W REFLEX MICROSCOPIC - Abnormal; Notable for the following:    Color, Urine AMBER (*)    APPearance CLOUDY (*)    Nitrite POSITIVE (*)    Leukocytes, UA LARGE (*)    All other components within normal limits  URINE MICROSCOPIC-ADD ON - Abnormal; Notable for the following:    Bacteria, UA MANY (*)    All other components within normal limits  I-STAT CHEM 8, ED - Abnormal; Notable for  the following:    Chloride 92 (*)    BUN 26 (*)    Glucose, Bld 145 (*)    All other components within normal limits  CBG MONITORING, ED - Abnormal; Notable for the following:    Glucose-Capillary 150 (*)    All other components within normal limits  URINE CULTURE  PROTIME-INR  APTT  CBC  DIFFERENTIAL  I-STAT TROPOININ, ED  I-STAT TROPOININ, ED    Imaging Review Ct Head Wo Contrast  09/04/2014   CLINICAL DATA:  Patient status post fall, altered mental status, left-sided weakness.  EXAM: CT HEAD WITHOUT CONTRAST  TECHNIQUE: Contiguous axial images were obtained from the base of the skull through the vertex without intravenous contrast.  COMPARISON:  Brain CT 03/03/2010  FINDINGS: Ventricles and  sulci are prominent, compatible with atrophy. Periventricular and subcortical white matter hypodensity compatible with chronic small vessel ischemic change. Age-indeterminate but likely chronic right thalamic lacunar infarct. No intracranial hemorrhage, mass lesion or mass effect. Bilateral cataract surgery. Paranasal sinuses are unremarkable. Mastoid air cells are well aerated. Calvarium is intact.  IMPRESSION: Periventricular and subcortical white matter hypodensity compatible with chronic small vessel ischemic change.  Age-indeterminate but likely chronic right thalamic lacunar infarct.  No acute intracranial process.   Electronically Signed   By: Annia Belt M.D.   On: 09/04/2014 10:44   Mr Brain Wo Contrast  09/04/2014   CLINICAL DATA:  81 year old female with recent onset confusion, recent fall, abnormal gait and word finding difficulty. Initial encounter.  EXAM: MRI HEAD WITHOUT CONTRAST  TECHNIQUE: Multiplanar, multiecho pulse sequences of the brain and surrounding structures were obtained without intravenous contrast.  COMPARISON:  Head CT without contrast 1026 hours today.  FINDINGS: Cerebral volume loss, fairly generalized and congruent with age.  No restricted diffusion or evidence of acute  infarction. Major intracranial vascular flow voids are within normal limits  No midline shift, mass effect, evidence of mass lesion, ventriculomegaly, extra-axial collection or acute intracranial hemorrhage. Cervicomedullary junction and pituitary are within normal limits. Patchy and confluent cerebral white matter T2 and FLAIR hyperintensity. No chronic blood products identified in the brain. Mild to moderate T2 heterogeneity in the deep gray matter nuclei. Brainstem and cerebellum are within normal limits for age.  Visible internal auditory structures appear normal. Right mastoid effusion. Negative visualized nasopharynx except for Puff retained secretions. Left mastoids and the paranasal sinuses are clear. Postoperative changes to the globes. Visualized scalp soft tissues are within normal limits. Negative visualized cervical spine. Normal bone marrow signal.  IMPRESSION: 1.  No acute intracranial abnormality. 2. Moderate for age nonspecific signal changes in the brain, most commonly due to chronic small vessel disease.   Electronically Signed   By: Odessa Fleming M.D.   On: 09/04/2014 14:06     EKG Interpretation   Date/Time:  Sunday September 04 2014 09:59:16 EDT Ventricular Rate:  82 PR Interval:  169 QRS Duration: 104 QT Interval:  406 QTC Calculation: 474 R Axis:   56 Text Interpretation:  Sinus rhythm Probable left atrial enlargement Left  ventricular hypertrophy Repol abnrm suggests ischemia, lateral leads  Baseline wander in lead(s) V4 Confirmed by Burgess Sheriff  MD, Kalena Mander ([id number]) on  09/04/2014 2:06:00 PM      MDM   Final diagnoses:  UTI (lower urinary tract infection)        Nelva Nay, MD 09/04/14 1436

## 2014-09-04 NOTE — ED Notes (Signed)
Patient transported to CT 

## 2014-09-04 NOTE — ED Notes (Signed)
Pt states that she fell in her bedroom this morning (unsure of exact time).  Unsure of exact circumstances surrounding fall but "thinks she tripped".  Pt states that since the fall, she has been feeling "confused".  Pt has what appears to be dried blood on her shirt but states that she spilled a boost.  Family states that fall was last night.  States that she was walking sideways yesterday.  States that pt has been leaning to the left.  Appeared very confused and was "rambling" when family went to pick her up for church this morning.  Clear speech, symmetrical face, but has trouble finding her words.

## 2014-09-04 NOTE — ED Notes (Signed)
Pt is currently in MRI

## 2014-09-04 NOTE — ED Notes (Signed)
PT alert and oriented.  Daughter taking patient home. Patient answering all questions appropriately. [facility]

## 2014-09-04 NOTE — ED Notes (Signed)
Dr Radford PaxBeaton made aware of abormal lab.

## 2014-09-04 NOTE — Discharge Instructions (Signed)

## 2014-09-05 ENCOUNTER — Other Ambulatory Visit: Payer: Self-pay

## 2014-09-05 ENCOUNTER — Telehealth: Payer: Self-pay

## 2014-09-05 DIAGNOSIS — M6281 Muscle weakness (generalized): Secondary | ICD-10-CM | POA: Diagnosis not present

## 2014-09-05 DIAGNOSIS — R278 Other lack of coordination: Secondary | ICD-10-CM | POA: Diagnosis not present

## 2014-09-05 DIAGNOSIS — R279 Unspecified lack of coordination: Secondary | ICD-10-CM | POA: Diagnosis not present

## 2014-09-05 DIAGNOSIS — R260 Ataxic gait: Secondary | ICD-10-CM | POA: Diagnosis not present

## 2014-09-05 MED ORDER — COLESTIPOL HCL 1 G PO TABS: 1.0000 g | ORAL_TABLET | Freq: Every day | ORAL | Status: DC

## 2014-09-05 MED ORDER — DIPHENOXYLATE-ATROPINE 2.5-0.025 MG PO TABS: 1.0000 | ORAL_TABLET | Freq: Three times a day (TID) | ORAL | Status: DC

## 2014-09-05 NOTE — Telephone Encounter (Signed)
The original message was through e-mail. I have sent the information back to the daughter via e-mail.

## 2014-09-05 NOTE — Telephone Encounter (Signed)
-----   Message from Hart Carwinora M Brodie, MD sent at 09/02/2014 12:05 AM EDT ----- Regarding: diarrhea  reviewed her record. Please restart Lomotil 1 po tid, or regular basis not prn. Also, please, add Colestipol 1gm, #30, 1 po qd. 3 refills. Follow up Dr Rhea BeltonPyrtle.May need to r/o microscopic colitis. If not a candidate for flex sig, I would give empirical trial if low dose steroid taper. DB

## 2014-09-06 ENCOUNTER — Encounter: Payer: Self-pay | Admitting: Internal Medicine

## 2014-09-06 ENCOUNTER — Ambulatory Visit (INDEPENDENT_AMBULATORY_CARE_PROVIDER_SITE_OTHER): Payer: Medicare Other | Admitting: Internal Medicine

## 2014-09-06 VITALS — BP 128/76 | HR 63 | Temp 97.7°F | Ht 65.0 in | Wt 116.0 lb

## 2014-09-06 DIAGNOSIS — R279 Unspecified lack of coordination: Secondary | ICD-10-CM | POA: Diagnosis not present

## 2014-09-06 DIAGNOSIS — N3000 Acute cystitis without hematuria: Secondary | ICD-10-CM

## 2014-09-06 DIAGNOSIS — I10 Essential (primary) hypertension: Secondary | ICD-10-CM | POA: Diagnosis not present

## 2014-09-06 DIAGNOSIS — R260 Ataxic gait: Secondary | ICD-10-CM | POA: Diagnosis not present

## 2014-09-06 DIAGNOSIS — R278 Other lack of coordination: Secondary | ICD-10-CM | POA: Diagnosis not present

## 2014-09-06 DIAGNOSIS — M6281 Muscle weakness (generalized): Secondary | ICD-10-CM | POA: Diagnosis not present

## 2014-09-06 DIAGNOSIS — N390 Urinary tract infection, site not specified: Secondary | ICD-10-CM | POA: Insufficient documentation

## 2014-09-06 LAB — URINE CULTURE
Colony Count: 70000
Special Requests: NORMAL

## 2014-09-06 NOTE — Progress Notes (Signed)
Patient ID: Beverly Barnett, female   DOB: 21-Feb-1924, 81 y.o.   MRN: [id number]    Facility  [facility]    Place of Service:   OFFICE   No Known Allergies  Chief Complaint  Patient presents with  . Hospitalization Follow-up    1 day hospital follow-up, diagnoised with UTI. Patient is doing much better per son     HPI:  Acute visit in follow-up of emergency department visit 09/04/2014. She had fallen and had a much more unstable gait and was using at present. Patient usually uses a walker. Patient was mentally confused for more than her usual self. She had difficulty knowing her daughter, and this is not usually a problem. There was difficulty in word finding. Following evaluation, she was noted to have urinary tract infection and was treated with antibiotic. She is currently taking Keflex 500 mg 3 times a day. She is now "more herself". She is able to identify family members. Gait has stabilized to her usual unstable gait. She is using her walker without the help of others. She lives in the independent living section of [facility]. Her appetite has improved.  Medications: Patient's Medications  New Prescriptions   No medications on file  Previous Medications   ATORVASTATIN (LIPITOR) 10 MG TABLET    Take 1 tablet (10 mg total) by mouth daily.   CEPHALEXIN (KEFLEX) 500 MG CAPSULE    Take 1 capsule (500 mg total) by mouth 3 (three) times daily.   COENZYME Q10 (CO Q 10) 100 MG CAPS    Take 1 tablet by mouth 2 (two) times daily.   COLESTIPOL (COLESTID) 1 G TABLET    Take 1 tablet (1 g total) by mouth daily.   DIPHENOXYLATE-ATROPINE (LOMOTIL) 2.5-0.025 MG PER TABLET    Take 1 tablet by mouth 3 (three) times daily.   DONEPEZIL (ARICEPT) 5 MG TABLET    Take 1 tablet (5 mg total) by mouth at bedtime.   LOSARTAN (COZAAR) 50 MG TABLET    TAKE ONE TABLET BY MOUTH EVERY DAY FOR BLOOD PRESSURE   MELATONIN 3 MG CAPS    Take 1 capsule (3 mg total) by mouth at bedtime and may repeat dose one time if needed.   METOPROLOL (LOPRESSOR) 50 MG TABLET    TAKE ONE TABLET BY MOUTH TWICE DAILY   METRONIDAZOLE (FLAGYL) 250 MG TABLET    Take 1 tablet (250 mg total) by mouth 4 (four) times daily.   MULTIPLE VITAMIN (MULITIVITAMIN WITH MINERALS) TABS    Take 1 tablet by mouth daily. 2 in am, 1 in pm   PHOSPHATIDYLSERINE-DHA-EPA 100-19.5-6.5 MG CAPS    Take 1 capsule by mouth daily.   SACCHAROMYCES BOULARDII (FLORASTOR) 250 MG CAPSULE    Take 1 capsule (250 mg total) by mouth 2 (two) times daily.  Modified Medications   No medications on file  Discontinued Medications   B COMPLEX VITAMINS (B-COMPLEX/B-12) TABS    Take 1 tablet by mouth daily.     Review of Systems  Constitutional: Negative for fever, chills, appetite change and unexpected weight change.  HENT: Negative.   Eyes: Negative.   Respiratory: Negative for shortness of breath.   Cardiovascular: Negative for chest pain.  Gastrointestinal: Positive for diarrhea. Negative for nausea, vomiting, abdominal pain, constipation, blood in stool and abdominal distention.  Endocrine: Negative.   Genitourinary: Negative.        Recent urinary tract infection.  Musculoskeletal: Positive for gait problem (frequent falls). Negative for myalgias, back pain,  joint swelling and arthralgias.  Skin: Negative for rash.  Neurological: Positive for weakness. Negative for dizziness and headaches.  Psychiatric/Behavioral:       Memory loss    Filed Vitals:   09/06/14 1354  BP: 128/76  Pulse: 63  Temp: 97.7 F (36.5 C)  TempSrc: Oral  Height: _0  (1.651 m)  Weight: 116 lb (52.617 kg)  SpO2: 98%   Body mass index is 19.3 kg/(m^2).  Physical Exam  Constitutional:  Frail appearing in NAD  Eyes: Conjunctivae and EOM are normal. Pupils are equal, round, and reactive to light.  Neck: No JVD present. No tracheal deviation present. No thyromegaly present.  Cardiovascular: Normal rate, regular rhythm, normal heart sounds and intact distal pulses.  Exam reveals no  gallop and no friction rub.   No murmur heard. No LE edema b/l. No calf TTP  Pulmonary/Chest: Effort normal and breath sounds normal. No respiratory distress. She has no wheezes. She has no rales.  Abdominal: Soft. Bowel sounds are normal. She exhibits no distension, no abdominal bruit, no pulsatile midline mass and no mass. There is no hepatomegaly. There is no tenderness. There is no rebound and no guarding.  Musculoskeletal:  Unstable gait. Using 4. Walker.  Lymphadenopathy:    She has no cervical adenopathy.  Neurological: She is alert.  Mild memory deficit  Skin: No rash noted. No erythema. No pallor.  Psychiatric: She has a normal mood and affect. Her behavior is normal.     Labs reviewed: Admission on 09/04/2014, Discharged on 09/04/2014  Component Date Value Ref Range Status  . Sodium 09/04/2014 144  135 - 145 mmol/L Final  . Potassium 09/04/2014 3.8  3.5 - 5.1 mmol/L Final  . Chloride 09/04/2014 92* 96 - 112 mmol/L Final  . BUN 09/04/2014 26* 6 - 23 mg/dL Final  . Creatinine, Ser 09/04/2014 0.70  0.50 - 1.10 mg/dL Final  . Glucose, Bld 09/04/2014 145* 70 - 99 mg/dL Final  . Calcium, Ion 09/04/2014 1.13  1.13 - 1.30 mmol/L Final  . TCO2 09/04/2014 21  0 - 100 mmol/L Final  . Hemoglobin 09/04/2014 13.3  12.0 - 15.0 g/dL Final  . HCT 09/04/2014 39.0  36.0 - 46.0 % Final  . Comment 09/04/2014 NOTIFIED PHYSICIAN   Final  . Troponin i, poc 09/04/2014 0.00  0.00 - 0.08 ng/mL Final  . Comment 3 09/04/2014          Final   Comment: Due to the release kinetics of cTnI, a negative result within the first hours of the onset of symptoms does not rule out myocardial infarction with certainty. If myocardial infarction is still suspected, repeat the test at appropriate intervals.   . Prothrombin Time 09/04/2014 14.4  11.6 - 15.2 seconds Final  . INR 09/04/2014 1.10  0.00 - 1.49 Final  . aPTT 09/04/2014 29  24 - 37 seconds Final  . WBC 09/04/2014 10.4  4.0 - 10.5 K/uL Final  . RBC  09/04/2014 4.13  3.87 - 5.11 MIL/uL Final  . Hemoglobin 09/04/2014 12.7  12.0 - 15.0 g/dL Final  . HCT 09/04/2014 39.0  36.0 - 46.0 % Final  . MCV 09/04/2014 94.4  78.0 - 100.0 fL Final  . MCH 09/04/2014 30.8  26.0 - 34.0 pg Final  . MCHC 09/04/2014 32.6  30.0 - 36.0 g/dL Final  . RDW 09/04/2014 14.5  11.5 - 15.5 % Final  . Platelets 09/04/2014 214  150 - 400 K/uL Final  . Neutrophils Relative % 09/04/2014  70  43 - 77 % Final  . Neutro Abs 09/04/2014 7.3  1.7 - 7.7 K/uL Final  . Lymphocytes Relative 09/04/2014 20  12 - 46 % Final  . Lymphs Abs 09/04/2014 2.1  0.7 - 4.0 K/uL Final  . Monocytes Relative 09/04/2014 9  3 - 12 % Final  . Monocytes Absolute 09/04/2014 0.9  0.1 - 1.0 K/uL Final  . Eosinophils Relative 09/04/2014 1  0 - 5 % Final  . Eosinophils Absolute 09/04/2014 0.1  0.0 - 0.7 K/uL Final  . Basophils Relative 09/04/2014 0  0 - 1 % Final  . Basophils Absolute 09/04/2014 0.0  0.0 - 0.1 K/uL Final  . Sodium 09/04/2014 143  135 - 145 mmol/L Final  . Potassium 09/04/2014 3.9  3.5 - 5.1 mmol/L Final  . Chloride 09/04/2014 110  96 - 112 mmol/L Final  . CO2 09/04/2014 25  19 - 32 mmol/L Final  . Glucose, Bld 09/04/2014 144* 70 - 99 mg/dL Final  . BUN 09/04/2014 26* 6 - 23 mg/dL Final  . Creatinine, Ser 09/04/2014 0.84  0.50 - 1.10 mg/dL Final  . Calcium 09/04/2014 8.9  8.4 - 10.5 mg/dL Final  . Total Protein 09/04/2014 6.7  6.0 - 8.3 g/dL Final  . Albumin 09/04/2014 3.7  3.5 - 5.2 g/dL Final  . AST 09/04/2014 32  0 - 37 U/L Final  . ALT 09/04/2014 25  0 - 35 U/L Final  . Alkaline Phosphatase 09/04/2014 48  39 - 117 U/L Final  . Total Bilirubin 09/04/2014 0.6  0.3 - 1.2 mg/dL Final  . GFR calc non Af Amer 09/04/2014 60* >90 mL/min Final  . GFR calc Af Amer 09/04/2014 70* >90 mL/min Final   Comment: (NOTE) The eGFR has been calculated using the CKD EPI equation. This calculation has not been validated in all clinical situations. eGFR's persistently <90 mL/min signify possible  Chronic Kidney Disease.   . Anion gap 09/04/2014 8  5 - 15 Final  . Color, Urine 09/04/2014 AMBER* YELLOW Final   BIOCHEMICALS MAY BE AFFECTED BY COLOR  . APPearance 09/04/2014 CLOUDY* CLEAR Final  . Specific Gravity, Urine 09/04/2014 1.019  1.005 - 1.030 Final  . pH 09/04/2014 6.5  5.0 - 8.0 Final  . Glucose, UA 09/04/2014 NEGATIVE  NEGATIVE mg/dL Final  . Hgb urine dipstick 09/04/2014 NEGATIVE  NEGATIVE Final  . Bilirubin Urine 09/04/2014 NEGATIVE  NEGATIVE Final  . Ketones, ur 09/04/2014 NEGATIVE  NEGATIVE mg/dL Final  . Protein, ur 09/04/2014 NEGATIVE  NEGATIVE mg/dL Final  . Urobilinogen, UA 09/04/2014 0.2  0.0 - 1.0 mg/dL Final  . Nitrite 09/04/2014 POSITIVE* NEGATIVE Final  . Leukocytes, UA 09/04/2014 LARGE* NEGATIVE Final  . Glucose-Capillary 09/04/2014 150* 70 - 99 mg/dL Final  . WBC, UA 09/04/2014 21-50  <3 WBC/hpf Final  . Bacteria, UA 09/04/2014 MANY* RARE Final  . Specimen Description 09/04/2014 URINE, CATHETERIZED   Final  . Special Requests 09/04/2014 Normal   Final  . Colony Count 09/04/2014    Final                   Value:70,000 COLONIES/ML Performed at [facility]   . Culture 09/04/2014    Final                   Value:GRAM NEGATIVE RODS Performed at [facility]   . Report Status 09/04/2014 PENDING   Incomplete  Appointment on 08/24/2014  Component Date Value Ref Range Status  .  Campylobacter, PCR 08/24/2014 Negative   Final  . C. difficile Tox A/B, PCR 08/24/2014 Negative   Final  . E coli 0157, PCR 08/24/2014 Negative   Final  . E coli (ETEC) LT/ST PCR 08/24/2014 Negative   Final  . E coli (STEC) stx1/stx2, PCR 08/24/2014 Negative   Final  . Salmonella, PCR 08/24/2014 Negative   Final  . Shigella, PCR 08/24/2014 Negative   Final  . Norovirus, PCR 08/24/2014 Negative   Final  . Rotavirus A, PCR 08/24/2014 Negative   Final  . Giardia lamblia, PCR 08/24/2014 Negative   Final  . Cryptosporidium, PCR 08/24/2014 Negative   Final   Comment:      ** Normal Reference Range for each Analyte: Not Detected **         The detection and identification of specific gastrointestinal microbial nucleic acid from individuals exhibiting signs and symptoms of gastrointestinal infection aids in the diagnosis of gastrointestinal infection when used in conjunction with clinical evaluation, laboratory findings, and epidemiological information.   ** The xTAG Gastrointestinal Pathogen Panel results are presumptive and must be confirmed by FDA-cleared tests or other acceptable reference methods.  The results of this test should not be used as the sole basis for diagnosis, treatment, or other patient management decisions.   Performed using the Luminex xTAG Gastrointestinal Pathogen Panel test kit.   . Lactoferrin 08/24/2014 POSITIVE   Final  Appointment on 08/24/2014  Component Date Value Ref Range Status  . WBC 08/24/2014 7.6  4.0 - 10.5 K/uL Final  . RBC 08/24/2014 4.04  3.87 - 5.11 Mil/uL Final  . Hemoglobin 08/24/2014 12.2  12.0 - 15.0 g/dL Final  . HCT 08/24/2014 36.6  36.0 - 46.0 % Final  . MCV 08/24/2014 90.6  78.0 - 100.0 fl Final  . MCHC 08/24/2014 33.3  30.0 - 36.0 g/dL Final  . RDW 08/24/2014 14.4  11.5 - 15.5 % Final  . Platelets 08/24/2014 194.0  150.0 - 400.0 K/uL Final  . Neutrophils Relative % 08/24/2014 49.6  43.0 - 77.0 % Final  . Lymphocytes Relative 08/24/2014 38.1  12.0 - 46.0 % Final  . Monocytes Relative 08/24/2014 9.6  3.0 - 12.0 % Final  . Eosinophils Relative 08/24/2014 2.1  0.0 - 5.0 % Final  . Basophils Relative 08/24/2014 0.6  0.0 - 3.0 % Final  . Neutro Abs 08/24/2014 3.7  1.4 - 7.7 K/uL Final  . Lymphs Abs 08/24/2014 2.9  0.7 - 4.0 K/uL Final  . Monocytes Absolute 08/24/2014 0.7  0.1 - 1.0 K/uL Final  . Eosinophils Absolute 08/24/2014 0.2  0.0 - 0.7 K/uL Final  . Basophils Absolute 08/24/2014 0.0  0.0 - 0.1 K/uL Final  . Sodium 08/24/2014 142  135 - 145 mEq/L Final  . Potassium 08/24/2014 4.1  3.5 - 5.1  mEq/L Final  . Chloride 08/24/2014 108  96 - 112 mEq/L Final  . CO2 08/24/2014 28  19 - 32 mEq/L Final  . Glucose, Bld 08/24/2014 103* 70 - 99 mg/dL Final  . BUN 08/24/2014 22  6 - 23 mg/dL Final  . Creatinine, Ser 08/24/2014 0.93  0.40 - 1.20 mg/dL Final  . Calcium 08/24/2014 9.4  8.4 - 10.5 mg/dL Final  . GFR 08/24/2014 60.40  >60.00 mL/min Final  Appointment on 08/04/2014  Component Date Value Ref Range Status  . Campylobacter Culture 08/04/2014 Final report   Final  . RESULT 1 08/04/2014 Comment   Final   No Campylobacter species isolated.  . Salmonella/Shigella Screen 08/04/2014  Final report   Final  . RESULT 1 08/04/2014 Comment   Final   No Salmonella or Shigella recovered.  . Campylobacter Culture 08/04/2014 Final report   Final   PLEASE REFER TO [id number]  . RESULT 1 08/04/2014 Comment   Final   No Campylobacter species isolated.  . E coli, Shiga toxin Assay 08/04/2014 Negative  Negative Final  . Ova + Parasite Exam 08/04/2014 Final report   Final   Comment: These results were obtained using wet preparation(s) and trichrome stained smear. This test does not include testing for Cryptosporidium parvum, Cyclospora, or Microsporidia.   Marland Kitchen RESULT 1 08/04/2014 Comment   Final   Comment: No ova, cysts, or parasites seen. One negative specimen does not rule out the possibility of a parasitic infection.   . C difficile by pcr 08/04/2014 Negative  Negative Final  Office Visit on 06/27/2014  Component Date Value Ref Range Status  . Glucose 06/27/2014 91  65 - 99 mg/dL Final  . BUN 06/27/2014 21  8 - 27 mg/dL Final  . Creatinine, Ser 06/27/2014 0.91  0.57 - 1.00 mg/dL Final  . GFR calc non Af Amer 06/27/2014 57* >59 mL/min/1.73 Final  . GFR calc Af Amer 06/27/2014 65  >59 mL/min/1.73 Final  . BUN/Creatinine Ratio 06/27/2014 23  11 - 26 Final  . Sodium 06/27/2014 143  134 - 144 mmol/L Final  . Potassium 06/27/2014 4.5  3.5 - 5.2 mmol/L Final  . Chloride 06/27/2014 103  97 -  108 mmol/L Final  . CO2 06/27/2014 25  18 - 29 mmol/L Final  . Calcium 06/27/2014 9.3  8.7 - 10.3 mg/dL Final  . Total Protein 06/27/2014 6.9  6.0 - 8.5 g/dL Final  . Albumin 06/27/2014 4.3  3.5 - 4.7 g/dL Final  . Globulin, Total 06/27/2014 2.6  1.5 - 4.5 g/dL Final  . Albumin/Globulin Ratio 06/27/2014 1.7  1.1 - 2.5 Final  . Total Bilirubin 06/27/2014 0.6  0.0 - 1.2 mg/dL Final  . Alkaline Phosphatase 06/27/2014 88  39 - 117 IU/L Final  . AST 06/27/2014 22  0 - 40 IU/L Final  . ALT 06/27/2014 12  0 - 32 IU/L Final  . Amylase 06/27/2014 89  31 - 124 U/L Final  . Lipase 06/27/2014 44  0 - 59 U/L Final     Assessment/Plan  1. Acute cystitis without hematuria Finish Keflex   2. Essential hypertension controlled

## 2014-09-07 ENCOUNTER — Telehealth (HOSPITAL_BASED_OUTPATIENT_CLINIC_OR_DEPARTMENT_OTHER): Payer: Self-pay | Admitting: Emergency Medicine

## 2014-09-07 DIAGNOSIS — R279 Unspecified lack of coordination: Secondary | ICD-10-CM | POA: Diagnosis not present

## 2014-09-07 DIAGNOSIS — R278 Other lack of coordination: Secondary | ICD-10-CM | POA: Diagnosis not present

## 2014-09-07 DIAGNOSIS — R260 Ataxic gait: Secondary | ICD-10-CM | POA: Diagnosis not present

## 2014-09-07 DIAGNOSIS — M6281 Muscle weakness (generalized): Secondary | ICD-10-CM | POA: Diagnosis not present

## 2014-09-07 NOTE — Telephone Encounter (Signed)
Post ED Visit - Positive Culture Follow-up  Culture report reviewed by antimicrobial stewardship pharmacist: []  Wes Dulaney, Pharm.D., BCPS []  Celedonio MiyamotoJeremy Frens, [address] BCPS []  Georgina PillionElizabeth Martin, [address] BCPS []  Pleasant HillMinh Pham, [address]Ph[address] BCPS, AAHIVP [x]  Estella HuskMichelle Turner, Pharm.D., BCPS, AAHIVP []  Elder CyphersLorie Poole, [address] BCPS  Positive urine Klebsiella culture Treated with cephalexin, organism sensitive to the same and no further patient follow-up is required at this time.  Berle MullMiller, Sissy Goetzke 09/07/2014, 12:24 PM

## 2014-09-08 ENCOUNTER — Encounter (HOSPITAL_COMMUNITY): Payer: Self-pay | Admitting: Emergency Medicine

## 2014-09-08 ENCOUNTER — Emergency Department (HOSPITAL_COMMUNITY)
Admission: EM | Admit: 2014-09-08 | Discharge: 2014-09-08 | Disposition: A | Discharge status: Home / Self Care | Payer: Medicare Other | Attending: Emergency Medicine | Admitting: Emergency Medicine

## 2014-09-08 DIAGNOSIS — R278 Other lack of coordination: Secondary | ICD-10-CM | POA: Diagnosis not present

## 2014-09-08 DIAGNOSIS — W1839XA Other fall on same level, initial encounter: Secondary | ICD-10-CM | POA: Diagnosis not present

## 2014-09-08 DIAGNOSIS — E780 Pure hypercholesterolemia: Secondary | ICD-10-CM | POA: Diagnosis not present

## 2014-09-08 DIAGNOSIS — R531 Weakness: Secondary | ICD-10-CM | POA: Insufficient documentation

## 2014-09-08 DIAGNOSIS — Z8739 Personal history of other diseases of the musculoskeletal system and connective tissue: Secondary | ICD-10-CM | POA: Insufficient documentation

## 2014-09-08 DIAGNOSIS — R279 Unspecified lack of coordination: Secondary | ICD-10-CM | POA: Diagnosis not present

## 2014-09-08 DIAGNOSIS — B9689 Other specified bacterial agents as the cause of diseases classified elsewhere: Secondary | ICD-10-CM | POA: Diagnosis not present

## 2014-09-08 DIAGNOSIS — R260 Ataxic gait: Secondary | ICD-10-CM | POA: Diagnosis not present

## 2014-09-08 DIAGNOSIS — Z792 Long term (current) use of antibiotics: Secondary | ICD-10-CM | POA: Diagnosis not present

## 2014-09-08 DIAGNOSIS — Z043 Encounter for examination and observation following other accident: Secondary | ICD-10-CM | POA: Insufficient documentation

## 2014-09-08 DIAGNOSIS — Y998 Other external cause status: Secondary | ICD-10-CM | POA: Insufficient documentation

## 2014-09-08 DIAGNOSIS — R51 Headache: Secondary | ICD-10-CM | POA: Diagnosis not present

## 2014-09-08 DIAGNOSIS — Z79899 Other long term (current) drug therapy: Secondary | ICD-10-CM | POA: Diagnosis not present

## 2014-09-08 DIAGNOSIS — Y9289 Other specified places as the place of occurrence of the external cause: Secondary | ICD-10-CM | POA: Diagnosis not present

## 2014-09-08 DIAGNOSIS — Y9389 Activity, other specified: Secondary | ICD-10-CM | POA: Insufficient documentation

## 2014-09-08 DIAGNOSIS — Z872 Personal history of diseases of the skin and subcutaneous tissue: Secondary | ICD-10-CM | POA: Diagnosis not present

## 2014-09-08 DIAGNOSIS — F0390 Unspecified dementia without behavioral disturbance: Secondary | ICD-10-CM | POA: Diagnosis not present

## 2014-09-08 DIAGNOSIS — E119 Type 2 diabetes mellitus without complications: Secondary | ICD-10-CM | POA: Insufficient documentation

## 2014-09-08 DIAGNOSIS — N390 Urinary tract infection, site not specified: Secondary | ICD-10-CM | POA: Diagnosis not present

## 2014-09-08 DIAGNOSIS — M6281 Muscle weakness (generalized): Secondary | ICD-10-CM | POA: Diagnosis not present

## 2014-09-08 DIAGNOSIS — I10 Essential (primary) hypertension: Secondary | ICD-10-CM | POA: Insufficient documentation

## 2014-09-08 DIAGNOSIS — S0990XA Unspecified injury of head, initial encounter: Secondary | ICD-10-CM | POA: Diagnosis not present

## 2014-09-08 LAB — BASIC METABOLIC PANEL
Anion gap: 7 (ref 5–15)
BUN: 18 mg/dL (ref 6–23)
CO2: 26 mmol/L (ref 19–32)
Calcium: 9.2 mg/dL (ref 8.4–10.5)
Chloride: 107 mmol/L (ref 96–112)
Creatinine, Ser: 0.85 mg/dL (ref 0.50–1.10)
GFR calc Af Amer: 69 mL/min — ABNORMAL LOW (ref >90)
GFR, EST NON AFRICAN AMERICAN: 59 mL/min — AB (ref >90)
Glucose, Bld: 117 mg/dL — ABNORMAL HIGH (ref 70–99)
Potassium: 4.2 mmol/L (ref 3.5–5.1)
Sodium: 140 mmol/L (ref 135–145)

## 2014-09-08 LAB — CBC WITH DIFFERENTIAL/PLATELET
Basophils Absolute: 0 10*3/uL (ref 0.0–0.1)
Basophils Relative: 0 % (ref 0–1)
EOS ABS: 0.2 10*3/uL (ref 0.0–0.7)
Eosinophils Relative: 3 % (ref 0–5)
HCT: 37.7 % (ref 36.0–46.0)
Hemoglobin: 12.1 g/dL (ref 12.0–15.0)
LYMPHS ABS: 2.7 10*3/uL (ref 0.7–4.0)
Lymphocytes Relative: 37 % (ref 12–46)
MCH: 30.5 pg (ref 26.0–34.0)
MCHC: 32.1 g/dL (ref 30.0–36.0)
MCV: 95 fL (ref 78.0–100.0)
Monocytes Absolute: 0.7 10*3/uL (ref 0.1–1.0)
Monocytes Relative: 10 % (ref 3–12)
NEUTROS ABS: 3.6 10*3/uL (ref 1.7–7.7)
NEUTROS PCT: 50 % (ref 43–77)
PLATELETS: 209 10*3/uL (ref 150–400)
RBC: 3.97 MIL/uL (ref 3.87–5.11)
RDW: 14.2 % (ref 11.5–15.5)
WBC: 7.2 10*3/uL (ref 4.0–10.5)

## 2014-09-08 LAB — URINALYSIS, ROUTINE W REFLEX MICROSCOPIC
Bilirubin Urine: NEGATIVE
Glucose, UA: NEGATIVE mg/dL
Hgb urine dipstick: NEGATIVE
Ketones, ur: NEGATIVE mg/dL
Leukocytes, UA: NEGATIVE
Nitrite: NEGATIVE
Protein, ur: NEGATIVE mg/dL
SPECIFIC GRAVITY, URINE: 1.007 (ref 1.005–1.030)
UROBILINOGEN UA: 0.2 mg/dL (ref 0.0–1.0)
pH: 6.5 (ref 5.0–8.0)

## 2014-09-08 NOTE — Progress Notes (Addendum)
[facility] who reports patient is unable to get up or ambulate without assistance.  EDCM called [facility] and discussed patient with Debbie RN who reports patient has to be able to ambulate independently to come back to that part of the facility.  Debbie reports she has discussed private duty nursing and assisted living with the family.  EDCM notified EDSW who will speak to patient and patient's family regarding higher level of care.  09/08/2014 A.Kapena Hamme RNCM 1854pm EDCM spoke to patient and her family at bedside.  EDCM provided patient's daughter with list of private duty nursing agencies and informed her it would be an out of pocket expense.  Patient's daughter thankful for resources.  No further EDCM needs at this time.  09/08/2014 A. Juliene Kirsh RNCM 2001pm EDCM spoke to patient and her daughter at bedside.  Patient's daughter confirms they will have people/family staying with the patient at the nursing facility during the day and at night for patient safety.

## 2014-09-08 NOTE — ED Provider Notes (Signed)
CSN: [id number]     Arrival date & time 09/08/14  1540 History   First MD Initiated Contact with Patient 09/08/14 1614     Chief Complaint  Patient presents with  . Fall     (Consider location/radiation/quality/duration/timing/severity/associated sxs/prior Treatment) HPI   Beverly Barnett is a 81 y.o. female who presents for evaluation of injury and fall. She lives alone, at a facility, and apparently stood up, to use her walker, to walk, but fell backwards striking her head on some object. She was able to get help by crawling to phone. She initially had head pain, but states that she has no head pain or neck pain at this time. She admits to not eating and drinking well the last few days, while she has been taking her antibiotic for urinary tract infection. She saw her PCP, 2 days ago, and at that time, he noted that she is having increasing difficulty with her gait, as well as some confusion. There are no other known modifying factors.   Past Medical History  Diagnosis Date  . Hypertension   . High cholesterol   . Osteoporosis   . Type II or unspecified type diabetes mellitus without mention of complication, not stated as uncontrolled   . Hyperosmolality and/or hypernatremia   . Dysphagia, unspecified(787.20)   . Other atopic dermatitis and related conditions   . Encounter for long-term (current) use of other medications   . Cardiac catheterisation as the cause of abnormal reaction of patient, or of later complication 10/05/2006  . Vascular dementia 06/22/2013   Past Surgical History  Procedure Laterality Date  . Femur surgery Right 03/2010  . Shoulder surgery Left 03/2010  . Femur im nail  07/24/2011    Procedure: INTRAMEDULLARY (IM) NAIL FEMORAL;  Surgeon: Javier Docker, MD;  Location: [facility];  Service: Orthopedics;  Laterality: Right;  Biomet, c-arm, Jackson table  . Intramedullary (im) nail intertrochanteric Left 07/30/2013    Procedure: INTRAMEDULLARY (IM) NAIL INTERTROCHANTRIC  LEFT;  Surgeon: Shelda Pal, MD;  Location: [facility];  Service: Orthopedics;  Laterality: Left;   Family History  Problem Relation Age of Onset  . Cancer Sister   . Heart disease Father    History  Substance Use Topics  . Smoking status: Never Smoker   . Smokeless tobacco: Not on file  . Alcohol Use: No   OB History    No data available     Review of Systems  All other systems reviewed and are negative.     Allergies  Review of patient's allergies indicates no known allergies.  Home Medications   Prior to Admission medications   Medication Sig Start Date End Date Taking? Authorizing Provider  atorvastatin (LIPITOR) 10 MG tablet Take 1 tablet (10 mg total) by mouth daily. 02/11/14  Yes Tiffany L Reed, DO  cephALEXin (KEFLEX) 500 MG capsule Take 1 capsule (500 mg total) by mouth 3 (three) times daily. 09/04/14  Yes Nelva Nay, MD  Coenzyme Q10 (CO Q 10) 100 MG CAPS Take 1 tablet by mouth 2 (two) times daily.   Yes Historical Provider, MD  colestipol (COLESTID) 1 G tablet Take 1 tablet (1 g total) by mouth daily. 09/05/14  Yes Hart Carwin, MD  diphenoxylate-atropine (LOMOTIL) 2.5-0.025 MG per tablet Take 1 tablet by mouth 3 (three) times daily. 09/05/14  Yes Hart Carwin, MD  donepezil (ARICEPT) 5 MG tablet Take 1 tablet (5 mg total) by mouth at bedtime. 07/20/13  Yes Mahima Glade Lloyd,  MD  losartan (COZAAR) 50 MG tablet TAKE ONE TABLET BY MOUTH EVERY DAY FOR BLOOD PRESSURE 07/12/14  Yes Oneal GroutMahima Pandey, MD  Melatonin 3 MG CAPS Take 1 capsule (3 mg total) by mouth at bedtime and may repeat dose one time if needed. 01/11/14  Yes Mahima Glade LloydPandey, MD  metoprolol (LOPRESSOR) 50 MG tablet TAKE ONE TABLET BY MOUTH TWICE DAILY 04/11/14  Yes Mahima Pandey, MD  Multiple Vitamin (MULITIVITAMIN WITH MINERALS) TABS Take 1 tablet by mouth daily. 2 in am, 1 in pm   Yes Historical Provider, MD  Phosphatidylserine-DHA-EPA 100-19.5-6.5 MG CAPS Take 1 capsule by mouth daily.   Yes Historical Provider, MD   saccharomyces boulardii (FLORASTOR) 250 MG capsule Take 1 capsule (250 mg total) by mouth 2 (two) times daily. 08/24/14  Yes Amy S [place], PA-C   BP 154/69 mmHg  Pulse 73  Temp(Src) 98.4 F (36.9 C) (Oral)  Resp 15  SpO2 94% Physical Exam  Constitutional: She is oriented to person, place, and time. She appears well-developed.  Elderly, frail  HENT:  Head: Normocephalic and atraumatic.  Right Ear: External ear normal.  Left Ear: External ear normal.  No swelling, deformity, laceration or abrasion of the scalp.  Eyes: Conjunctivae and EOM are normal. Pupils are equal, round, and reactive to light.  Neck: Normal range of motion and phonation normal. Neck supple.  Cardiovascular: Normal rate, regular rhythm and normal heart sounds.   Pulmonary/Chest: Effort normal and breath sounds normal. She exhibits no bony tenderness.  Abdominal: Soft. There is no tenderness.  Musculoskeletal: Normal range of motion.  Normal range of motion. Large joints, bilaterally.  Neurological: She is alert and oriented to person, place, and time. No cranial nerve deficit or sensory deficit. She exhibits normal muscle tone. Coordination normal.  Skin: Skin is warm, dry and intact.  Psychiatric: She has a normal mood and affect. Her behavior is normal.  Nursing note and vitals reviewed.   ED Course  Procedures (including critical care time)  16:40- I attempted to ambulate the patient. She was unable to stand at the bedside, without support. She was essentially unable to walk due to instability, or weakness.   Medications - No data to display  Patient Vitals for the past 24 hrs:  BP Temp Temp src Pulse Resp SpO2  09/08/14 2006 154/69 mmHg 98.4 F (36.9 C) Oral 73 15 94 %  09/08/14 1919 175/66 mmHg - - 70 15 95 %  09/08/14 1800 132/55 mmHg - - 70 14 95 %  09/08/14 1749 147/64 mmHg - - 71 14 96 %  09/08/14 1600 164/60 mmHg - - 72 17 98 %    16:45- . I discussed findings with the patient's daughter,  who is on her way here.  6:07 PM Reevaluation with update and discussion. After initial assessment and treatment, an updated evaluation reveals patient is unchanged clinically. Findings were discussed with the patient's daughter, who is now here with her. I discussed with ED social work, and they are contacting her facility, to see about advancing her from independent, to assisted living. The patient cannot be discharged from the ED, without a higher level of care. Jahdai Padovano L    Social work was unable to give patient, place, and higher-level care tonight. Patient's daughter agreed to go with her to the independent living facility in stable with patient, and investigative other family members to stay with her, until she can be placed in a higher level of care. Social work at her facility  has been contacted and will assist with this change in living status.  Labs Review Labs Reviewed  BASIC METABOLIC PANEL - Abnormal; Notable for the following:    Glucose, Bld 117 (*)    GFR calc non Af Amer 59 (*)    GFR calc Af Amer 69 (*)    All other components within normal limits  URINE CULTURE  CBC WITH DIFFERENTIAL/PLATELET  URINALYSIS, ROUTINE W REFLEX MICROSCOPIC    Imaging Review No results found.   EKG Interpretation None      MDM   Final diagnoses:  Weakness  Urinary tract infection without hematuria, site unspecified      Weakness with fall, without serious injury. Urinary tract infection is currently under treatment. Patient is weakness may be related to decreased oral intake, associated with use of antibiotic, and urinary tract infection. Doubt serious bacterial infection or metabolic instability.   Nursing Notes Reviewed/ Care Coordinated Applicable Imaging Reviewed Interpretation of Laboratory Data incorporated into ED treatment  The patient appears reasonably screened and/or stabilized for discharge and I doubt any other medical condition or other [facility] requiring further  screening, evaluation, or treatment in the ED at this time prior to discharge.  Plan: Home Medications- usual; Home Treatments- rest; return here if the recommended treatment, does not improve the symptoms; Recommended follow up- PCP 1 week for check up   Mancel Bale, MD 09/09/14 [phone]

## 2014-09-08 NOTE — ED Notes (Addendum)
Per EMS pt comes from [facility] for [facility] after getting up from her recliner.  Pt states that initially after fall her posterior head hurt but now pt has no complaints and states that she feels better.

## 2014-09-08 NOTE — ED Notes (Signed)
Bed: [id number] Expected date:  Expected time:  Means of arrival:  Comments: 81 y/o fall

## 2014-09-08 NOTE — Progress Notes (Signed)
CSW met with pt at bedside. Family was present. Patient confirms that she presents to [facility] due to fall. Patient states " I stood up to walk and just fell ". Also, confirms that she is from [place].  Patient states that she has fallen within the past 6 months. Patient and family informed CSW that she lives in the independent unit at the facility. However, she states that they do assist her with completing her ADL's. Patient states that her daughter is her primary support sytem. Patient states that her daughter and son in law are very good to her and visit often.  CSW was notified by Nurse CM that pt was not able to ambulate well alone. CSW reached out to [facility] and spoke with Jackelyn Poling who states that she is a Marine scientist at the facility. Debbie informed CSW that the pt is welcomed back to the facility. However, due to her not being able to ambulate she would have to have a family member stay the night. Jackelyn Poling states that the pt would not be able to transfer directly from the independent living to the assisted living unit tonight. Also, she states that she is not sure how long it would take for the pt to transfer. CSW requested to speak with the facility social worker or Mud[facility]. However, neither were present.   CSW informed patient's daughter at bedside of the conversation with the nurse from [facility]. CSW made it clear that in order for the pt to return to the facility tonight that someone would have to stay with the pt overnight. CSW also informed daughter that Nurse Jackelyn Poling states that she is not certain of how long it will take to transfer the pt to the assisted living unit.  Daughter and pt do not have have any questions. Patient did express that she would rather stay in the Independent living unit. However, CSW informed her that, that would not be possible at this time.  Also, earlier CSW spoke with Mia of [facility] states that currently while the pt is in their independent living unit the patient  receives the following services :  [facility] with changing clothes in the morning Medication Management  CSW will follow up with [facility] Social Worker tomorrow : September 09, 2014.  Willette Brace [phone] ED CSW 09/08/2014 8:59 PM

## 2014-09-08 NOTE — Discharge Instructions (Signed)
Try to eat and drink regularly. Continue taking the antibiotic, and tell it is finished. Do not get up or walk without assistance. See your primary care doctor as soon as possible to be reevaluated, and get additional help, as needed.   Weakness Weakness is a lack of strength. It may be felt all over the body (generalized) or in one specific part of the body (focal). Some causes of weakness can be serious. You may need further medical evaluation, especially if you are elderly or you have a history of immunosuppression (such as chemotherapy or HIV), kidney disease, heart disease, or diabetes. CAUSES  Weakness can be caused by many different things, including:  Infection.  Physical exhaustion.  Internal bleeding or other blood loss that results in a lack of red blood cells (anemia).  Dehydration. This cause is more common in elderly people.  Side effects or electrolyte abnormalities from medicines, such as pain medicines or sedatives.  Emotional distress, anxiety, or depression.  Circulation problems, especially severe peripheral arterial disease.  Heart disease, such as rapid atrial fibrillation, bradycardia, or heart failure.  Nervous system disorders, such as Guillain-Barr syndrome, multiple sclerosis, or stroke. DIAGNOSIS  To find the cause of your weakness, your caregiver will take your history and perform a physical exam. Lab tests or X-rays may also be ordered, if needed. TREATMENT  Treatment of weakness depends on the cause of your symptoms and can vary greatly. HOME CARE INSTRUCTIONS   Rest as needed.  Eat a well-balanced diet.  Try to get some exercise every day.  Only take over-the-counter or prescription medicines as directed by your caregiver. SEEK MEDICAL CARE IF:   Your weakness seems to be getting worse or spreads to other parts of your body.  You develop new aches or pains. SEEK IMMEDIATE MEDICAL CARE IF:   You cannot perform your normal daily  activities, such as getting dressed and feeding yourself.  You cannot walk up and down stairs, or you feel exhausted when you do so.  You have shortness of breath or chest pain.  You have difficulty moving parts of your body.  You have weakness in only one area of the body or on only one side of the body.  You have a fever.  You have trouble speaking or swallowing.  You cannot control your bladder or bowel movements.  You have black or bloody vomit or stools. MAKE SURE YOU:  Understand these instructions.  Will watch your condition.  Will get help right away if you are not doing well or get worse. Document Released: 05/27/2005 Document Revised: 11/26/2011 Document Reviewed: 07/26/2011 [facility] Patient Information 2015 [employer] [place]. This information is not intended to replace advice given to you by your health care provider. Make sure you discuss any questions you have with your health care provider.

## 2014-09-08 NOTE — ED Notes (Signed)
Patient urinated on her own, no need to do an in and out

## 2014-09-08 NOTE — Progress Notes (Addendum)
ED CM consulted by EDP, Effie ShyWentz after evaluating pt about pt disposition. [facility]. EDP states pt lives in independent area of [facility]. Pending labs and imaging to evaluate second fall since 09/06/14 may further assist with determination of disposition  1751 Pt evaluated by ED SW & ED CM.  Pt confirms she is at Independent living area of [facility] but " I do not want assisted living" level of care.  Pt has 3 walkers, One she does not like, one with wheels she uses in apt and " rickety one"  Pt reports missing her basket from one of her walkers.  Pt reports having a bed given to her by a friend that she does not like because it is too high. CM discussed with Daughter, Amy that possible home/apt safety checks may be needed.  Amy reports [facility] had their on home health services (no [place] home health agency list offered) and reports pt has been seen more frequently because of a recent dx of UTI that she is being treated for. No reasons identified for the frequent falls other than mechanical reasons, turning an falling. Cm discussed EDP voiced concern with pt safety at present level of care and possible need for an increase level of care.  Pt and family interested in maintaining present level of care and considering their options at the facility later on.  Pending labs, UA, imaging results Pt states she had a daughter and 2 sons that live out of state ([place], etc)

## 2014-09-09 DIAGNOSIS — R278 Other lack of coordination: Secondary | ICD-10-CM | POA: Diagnosis not present

## 2014-09-09 DIAGNOSIS — M6281 Muscle weakness (generalized): Secondary | ICD-10-CM | POA: Diagnosis not present

## 2014-09-09 DIAGNOSIS — R279 Unspecified lack of coordination: Secondary | ICD-10-CM | POA: Diagnosis not present

## 2014-09-09 DIAGNOSIS — R260 Ataxic gait: Secondary | ICD-10-CM | POA: Diagnosis not present

## 2014-09-10 LAB — URINE CULTURE
Colony Count: NO GROWTH
Culture: NO GROWTH

## 2014-09-12 DIAGNOSIS — R278 Other lack of coordination: Secondary | ICD-10-CM | POA: Diagnosis not present

## 2014-09-12 DIAGNOSIS — R279 Unspecified lack of coordination: Secondary | ICD-10-CM | POA: Diagnosis not present

## 2014-09-12 DIAGNOSIS — R260 Ataxic gait: Secondary | ICD-10-CM | POA: Diagnosis not present

## 2014-09-12 DIAGNOSIS — M6281 Muscle weakness (generalized): Secondary | ICD-10-CM | POA: Diagnosis not present

## 2014-09-13 DIAGNOSIS — R278 Other lack of coordination: Secondary | ICD-10-CM | POA: Diagnosis not present

## 2014-09-13 DIAGNOSIS — M6281 Muscle weakness (generalized): Secondary | ICD-10-CM | POA: Diagnosis not present

## 2014-09-13 DIAGNOSIS — R260 Ataxic gait: Secondary | ICD-10-CM | POA: Diagnosis not present

## 2014-09-13 DIAGNOSIS — R279 Unspecified lack of coordination: Secondary | ICD-10-CM | POA: Diagnosis not present

## 2014-09-14 DIAGNOSIS — R279 Unspecified lack of coordination: Secondary | ICD-10-CM | POA: Diagnosis not present

## 2014-09-14 DIAGNOSIS — M6281 Muscle weakness (generalized): Secondary | ICD-10-CM | POA: Diagnosis not present

## 2014-09-14 DIAGNOSIS — R278 Other lack of coordination: Secondary | ICD-10-CM | POA: Diagnosis not present

## 2014-09-14 DIAGNOSIS — R260 Ataxic gait: Secondary | ICD-10-CM | POA: Diagnosis not present

## 2014-09-15 DIAGNOSIS — M6281 Muscle weakness (generalized): Secondary | ICD-10-CM | POA: Diagnosis not present

## 2014-09-15 DIAGNOSIS — R278 Other lack of coordination: Secondary | ICD-10-CM | POA: Diagnosis not present

## 2014-09-15 DIAGNOSIS — R260 Ataxic gait: Secondary | ICD-10-CM | POA: Diagnosis not present

## 2014-09-15 DIAGNOSIS — R279 Unspecified lack of coordination: Secondary | ICD-10-CM | POA: Diagnosis not present

## 2014-09-16 ENCOUNTER — Encounter: Payer: Self-pay | Admitting: Internal Medicine

## 2014-09-16 ENCOUNTER — Ambulatory Visit (INDEPENDENT_AMBULATORY_CARE_PROVIDER_SITE_OTHER): Payer: Medicare Other | Admitting: Internal Medicine

## 2014-09-16 VITALS — BP 156/82 | HR 63 | Temp 98.0°F | Wt 114.0 lb

## 2014-09-16 DIAGNOSIS — I10 Essential (primary) hypertension: Secondary | ICD-10-CM | POA: Diagnosis not present

## 2014-09-16 DIAGNOSIS — R260 Ataxic gait: Secondary | ICD-10-CM | POA: Diagnosis not present

## 2014-09-16 DIAGNOSIS — M6281 Muscle weakness (generalized): Secondary | ICD-10-CM | POA: Diagnosis not present

## 2014-09-16 DIAGNOSIS — R278 Other lack of coordination: Secondary | ICD-10-CM | POA: Diagnosis not present

## 2014-09-16 DIAGNOSIS — R531 Weakness: Secondary | ICD-10-CM

## 2014-09-16 DIAGNOSIS — F0150 Vascular dementia without behavioral disturbance: Secondary | ICD-10-CM

## 2014-09-16 DIAGNOSIS — R279 Unspecified lack of coordination: Secondary | ICD-10-CM | POA: Diagnosis not present

## 2014-09-16 MED ORDER — LOSARTAN POTASSIUM 50 MG PO TABS: 75.0000 mg | ORAL_TABLET | Freq: Every day | ORAL | Status: DC

## 2014-09-16 NOTE — Progress Notes (Signed)
Patient ID: Beverly Barnett, female   DOB: [birth year], 81 y.o.   MRN: [id number]    Facility  [facility]    Place of Service:   OFFICE   No Known Allergies  Chief Complaint  Patient presents with  . Hospitalization Follow-up    went to ER 09/08/14 fell backwards, hitting head  . Medical Management of Chronic Issues    dementia, blood pressure    HPI:  81 yo female seen today for f/u. Diarrhea resolved. She does have bowel incontinence and does not make it to toilet in time. She fell on 3/31st and had to be transported to ER by EMS after a fall at home. Weakness though to be due to bactrim she was taking for UTI. No further bladder sx's.  Past Medical History  Diagnosis Date  . Hypertension   . High cholesterol   . Osteoporosis   . Type II or unspecified type diabetes mellitus without mention of complication, not stated as uncontrolled   . Hyperosmolality and/or hypernatremia   . Dysphagia, unspecified(787.20)   . Other atopic dermatitis and related conditions   . Encounter for long-term (current) use of other medications   . Cardiac catheterisation as the cause of abnormal reaction of patient, or of later complication 47/42/5956  . Vascular dementia 06/22/2013   Past Surgical History  Procedure Laterality Date  . Femur surgery Right 03/2010  . Shoulder surgery Left 03/2010  . Femur im nail  07/24/2011    Procedure: INTRAMEDULLARY (IM) NAIL FEMORAL;  Surgeon: Johnn Hai, MD;  Location: [facility];  Service: Orthopedics;  Laterality: Right;  Biomet, c-arm, Jackson table  . Intramedullary (im) nail intertrochanteric Left 07/30/2013    Procedure: INTRAMEDULLARY (IM) NAIL INTERTROCHANTRIC LEFT;  Surgeon: Mauri Pole, MD;  Location: [facility];  Service: Orthopedics;  Laterality: Left;   History   Social History  . Marital Status: Widowed    Spouse Name: N/A  . Number of Children: N/A  . Years of Education: N/A   Social History Main Topics  . Smoking status: Never Smoker   . Smokeless  tobacco: Never Used  . Alcohol Use: No  . Drug Use: No  . Sexual Activity: Not on file   Other Topics Concern  . None   Social History Narrative    Medications: Patient's Medications  New Prescriptions   No medications on file  Previous Medications   ATORVASTATIN (LIPITOR) 10 MG TABLET    Take 1 tablet (10 mg total) by mouth daily.   CEPHALEXIN (KEFLEX) 500 MG CAPSULE    Take 1 capsule (500 mg total) by mouth 3 (three) times daily.   COENZYME Q10 (CO Q 10) 100 MG CAPS    Take 1 tablet by mouth 2 (two) times daily.   COLESTIPOL (COLESTID) 1 G TABLET    Take 1 tablet (1 g total) by mouth daily.   DIPHENOXYLATE-ATROPINE (LOMOTIL) 2.5-0.025 MG PER TABLET    Take 1 tablet by mouth 3 (three) times daily.   DONEPEZIL (ARICEPT) 5 MG TABLET    Take 1 tablet (5 mg total) by mouth at bedtime.   LOSARTAN (COZAAR) 50 MG TABLET    TAKE ONE TABLET BY MOUTH EVERY DAY FOR BLOOD PRESSURE   MELATONIN 3 MG CAPS    Take 1 capsule (3 mg total) by mouth at bedtime and may repeat dose one time if needed.   METOPROLOL (LOPRESSOR) 50 MG TABLET    TAKE ONE TABLET BY MOUTH TWICE DAILY  MULTIPLE VITAMIN (MULITIVITAMIN WITH MINERALS) TABS    Take 1 tablet by mouth daily. 2 in am, 1 in pm   PHOSPHATIDYLSERINE-DHA-EPA 100-19.5-6.5 MG CAPS    Take 1 capsule by mouth daily.   SACCHAROMYCES BOULARDII (FLORASTOR) 250 MG CAPSULE    Take 1 capsule (250 mg total) by mouth 2 (two) times daily.  Modified Medications   No medications on file  Discontinued Medications   No medications on file     Review of Systems  Unable to perform ROS: Dementia    Filed Vitals:   09/16/14 1613  BP: 156/82  Pulse: 63  Temp: 98 F (36.7 C)  TempSrc: Oral  Weight: 114 lb (51.71 kg)  SpO2: 95%   Body mass index is 18.97 kg/(m^2).  Physical Exam  Constitutional: She appears well-nourished.  Frail appearing in NAD. Sitting on wheeled walker. Son-in-law's brother, Festus Aloe, present  HENT:  Mouth/Throat: Oropharynx is clear and  moist. No oropharyngeal exudate.  Eyes: EOM are normal. Pupils are equal, round, and reactive to light. No scleral icterus.  Neck: Neck supple. No tracheal deviation present. No thyromegaly present.  Cardiovascular: Normal rate, regular rhythm, normal heart sounds and intact distal pulses.  Exam reveals no gallop and no friction rub.   No murmur heard. No LE edema b/l. no calf TTP. No carotid bruit b/l  Pulmonary/Chest: Effort normal and breath sounds normal. No stridor. No respiratory distress. She has no wheezes. She has no rales.  Abdominal: Soft. Bowel sounds are normal. She exhibits no distension and no mass. There is no tenderness. There is no rebound and no guarding.  Musculoskeletal: She exhibits edema.  Lymphadenopathy:    She has no cervical adenopathy.  Neurological: She is alert. No cranial nerve deficit (CN 2-12 grossly intact).  Reduced LE strength and she is wobb;y when stands from seated position. Time to stand >3 secs.  Skin: Skin is warm and dry. No rash noted.  Psychiatric: She has a normal mood and affect. Her behavior is normal.     Labs reviewed: Admission on 09/08/2014, Discharged on 09/08/2014  Component Date Value Ref Range Status  . Sodium 09/08/2014 140  135 - 145 mmol/L Final  . Potassium 09/08/2014 4.2  3.5 - 5.1 mmol/L Final  . Chloride 09/08/2014 107  96 - 112 mmol/L Final  . CO2 09/08/2014 26  19 - 32 mmol/L Final  . Glucose, Bld 09/08/2014 117* 70 - 99 mg/dL Final  . BUN 09/08/2014 18  6 - 23 mg/dL Final  . Creatinine, Ser 09/08/2014 0.85  0.50 - 1.10 mg/dL Final  . Calcium 09/08/2014 9.2  8.4 - 10.5 mg/dL Final  . GFR calc non Af Amer 09/08/2014 59* >90 mL/min Final  . GFR calc Af Amer 09/08/2014 69* >90 mL/min Final   Comment: (NOTE) The eGFR has been calculated using the CKD EPI equation. This calculation has not been validated in all clinical situations. eGFR's persistently <90 mL/min signify possible Chronic Kidney Disease.   . Anion gap  09/08/2014 7  5 - 15 Final  . WBC 09/08/2014 7.2  4.0 - 10.5 K/uL Final  . RBC 09/08/2014 3.97  3.87 - 5.11 MIL/uL Final  . Hemoglobin 09/08/2014 12.1  12.0 - 15.0 g/dL Final  . HCT 09/08/2014 37.7  36.0 - 46.0 % Final  . MCV 09/08/2014 95.0  78.0 - 100.0 fL Final  . MCH 09/08/2014 30.5  26.0 - 34.0 pg Final  . MCHC 09/08/2014 32.1  30.0 - 36.0 g/dL Final  .  RDW 09/08/2014 14.2  11.5 - 15.5 % Final  . Platelets 09/08/2014 209  150 - 400 K/uL Final  . Neutrophils Relative % 09/08/2014 50  43 - 77 % Final  . Neutro Abs 09/08/2014 3.6  1.7 - 7.7 K/uL Final  . Lymphocytes Relative 09/08/2014 37  12 - 46 % Final  . Lymphs Abs 09/08/2014 2.7  0.7 - 4.0 K/uL Final  . Monocytes Relative 09/08/2014 10  3 - 12 % Final  . Monocytes Absolute 09/08/2014 0.7  0.1 - 1.0 K/uL Final  . Eosinophils Relative 09/08/2014 3  0 - 5 % Final  . Eosinophils Absolute 09/08/2014 0.2  0.0 - 0.7 K/uL Final  . Basophils Relative 09/08/2014 0  0 - 1 % Final  . Basophils Absolute 09/08/2014 0.0  0.0 - 0.1 K/uL Final  . Specimen Description 09/08/2014 URINE, CATHETERIZED   Final  . Special Requests 09/08/2014 NONE   Final  . Colony Count 09/08/2014    Final                   Value:NO GROWTH Performed at [facility]   . Culture 09/08/2014    Final                   Value:NO GROWTH Performed at [facility]   . Report Status 09/08/2014 09/10/2014 FINAL   Final  . Color, Urine 09/08/2014 YELLOW  YELLOW Final  . APPearance 09/08/2014 CLEAR  CLEAR Final  . Specific Gravity, Urine 09/08/2014 1.007  1.005 - 1.030 Final  . pH 09/08/2014 6.5  5.0 - 8.0 Final  . Glucose, UA 09/08/2014 NEGATIVE  NEGATIVE mg/dL Final  . Hgb urine dipstick 09/08/2014 NEGATIVE  NEGATIVE Final  . Bilirubin Urine 09/08/2014 NEGATIVE  NEGATIVE Final  . Ketones, ur 09/08/2014 NEGATIVE  NEGATIVE mg/dL Final  . Protein, ur 09/08/2014 NEGATIVE  NEGATIVE mg/dL Final  . Urobilinogen, UA 09/08/2014 0.2  0.0 - 1.0 mg/dL Final  .  Nitrite 09/08/2014 NEGATIVE  NEGATIVE Final  . Leukocytes, UA 09/08/2014 NEGATIVE  NEGATIVE Final   MICROSCOPIC NOT DONE ON URINES WITH NEGATIVE PROTEIN, BLOOD, LEUKOCYTES, NITRITE, OR GLUCOSE <1000 mg/dL.  Admission on 09/04/2014, Discharged on 09/04/2014  Component Date Value Ref Range Status  . Sodium 09/04/2014 144  135 - 145 mmol/L Final  . Potassium 09/04/2014 3.8  3.5 - 5.1 mmol/L Final  . Chloride 09/04/2014 92* 96 - 112 mmol/L Final  . BUN 09/04/2014 26* 6 - 23 mg/dL Final  . Creatinine, Ser 09/04/2014 0.70  0.50 - 1.10 mg/dL Final  . Glucose, Bld 09/04/2014 145* 70 - 99 mg/dL Final  . Calcium, Ion 09/04/2014 1.13  1.13 - 1.30 mmol/L Final  . TCO2 09/04/2014 21  0 - 100 mmol/L Final  . Hemoglobin 09/04/2014 13.3  12.0 - 15.0 g/dL Final  . HCT 09/04/2014 39.0  36.0 - 46.0 % Final  . Comment 09/04/2014 NOTIFIED PHYSICIAN   Final  . Troponin i, poc 09/04/2014 0.00  0.00 - 0.08 ng/mL Final  . Comment 3 09/04/2014          Final   Comment: Due to the release kinetics of cTnI, a negative result within the first hours of the onset of symptoms does not rule out myocardial infarction with certainty. If myocardial infarction is still suspected, repeat the test at appropriate intervals.   . Prothrombin Time 09/04/2014 14.4  11.6 - 15.2 seconds Final  . INR 09/04/2014 1.10  0.00 - 1.49 Final  .  aPTT 09/04/2014 29  24 - 37 seconds Final  . WBC 09/04/2014 10.4  4.0 - 10.5 K/uL Final  . RBC 09/04/2014 4.13  3.87 - 5.11 MIL/uL Final  . Hemoglobin 09/04/2014 12.7  12.0 - 15.0 g/dL Final  . HCT 09/04/2014 39.0  36.0 - 46.0 % Final  . MCV 09/04/2014 94.4  78.0 - 100.0 fL Final  . MCH 09/04/2014 30.8  26.0 - 34.0 pg Final  . MCHC 09/04/2014 32.6  30.0 - 36.0 g/dL Final  . RDW 09/04/2014 14.5  11.5 - 15.5 % Final  . Platelets 09/04/2014 214  150 - 400 K/uL Final  . Neutrophils Relative % 09/04/2014 70  43 - 77 % Final  . Neutro Abs 09/04/2014 7.3  1.7 - 7.7 K/uL Final  . Lymphocytes  Relative 09/04/2014 20  12 - 46 % Final  . Lymphs Abs 09/04/2014 2.1  0.7 - 4.0 K/uL Final  . Monocytes Relative 09/04/2014 9  3 - 12 % Final  . Monocytes Absolute 09/04/2014 0.9  0.1 - 1.0 K/uL Final  . Eosinophils Relative 09/04/2014 1  0 - 5 % Final  . Eosinophils Absolute 09/04/2014 0.1  0.0 - 0.7 K/uL Final  . Basophils Relative 09/04/2014 0  0 - 1 % Final  . Basophils Absolute 09/04/2014 0.0  0.0 - 0.1 K/uL Final  . Sodium 09/04/2014 143  135 - 145 mmol/L Final  . Potassium 09/04/2014 3.9  3.5 - 5.1 mmol/L Final  . Chloride 09/04/2014 110  96 - 112 mmol/L Final  . CO2 09/04/2014 25  19 - 32 mmol/L Final  . Glucose, Bld 09/04/2014 144* 70 - 99 mg/dL Final  . BUN 09/04/2014 26* 6 - 23 mg/dL Final  . Creatinine, Ser 09/04/2014 0.84  0.50 - 1.10 mg/dL Final  . Calcium 09/04/2014 8.9  8.4 - 10.5 mg/dL Final  . Total Protein 09/04/2014 6.7  6.0 - 8.3 g/dL Final  . Albumin 09/04/2014 3.7  3.5 - 5.2 g/dL Final  . AST 09/04/2014 32  0 - 37 U/L Final  . ALT 09/04/2014 25  0 - 35 U/L Final  . Alkaline Phosphatase 09/04/2014 48  39 - 117 U/L Final  . Total Bilirubin 09/04/2014 0.6  0.3 - 1.2 mg/dL Final  . GFR calc non Af Amer 09/04/2014 60* >90 mL/min Final  . GFR calc Af Amer 09/04/2014 70* >90 mL/min Final   Comment: (NOTE) The eGFR has been calculated using the CKD EPI equation. This calculation has not been validated in all clinical situations. eGFR's persistently <90 mL/min signify possible Chronic Kidney Disease.   . Anion gap 09/04/2014 8  5 - 15 Final  . Color, Urine 09/04/2014 AMBER* YELLOW Final   BIOCHEMICALS MAY BE AFFECTED BY COLOR  . APPearance 09/04/2014 CLOUDY* CLEAR Final  . Specific Gravity, Urine 09/04/2014 1.019  1.005 - 1.030 Final  . pH 09/04/2014 6.5  5.0 - 8.0 Final  . Glucose, UA 09/04/2014 NEGATIVE  NEGATIVE mg/dL Final  . Hgb urine dipstick 09/04/2014 NEGATIVE  NEGATIVE Final  . Bilirubin Urine 09/04/2014 NEGATIVE  NEGATIVE Final  . Ketones, ur 09/04/2014  NEGATIVE  NEGATIVE mg/dL Final  . Protein, ur 09/04/2014 NEGATIVE  NEGATIVE mg/dL Final  . Urobilinogen, UA 09/04/2014 0.2  0.0 - 1.0 mg/dL Final  . Nitrite 09/04/2014 POSITIVE* NEGATIVE Final  . Leukocytes, UA 09/04/2014 LARGE* NEGATIVE Final  . Glucose-Capillary 09/04/2014 150* 70 - 99 mg/dL Final  . WBC, UA 09/04/2014 21-50  <3 WBC/hpf Final  . Bacteria,  UA 09/04/2014 MANY* RARE Final  . Specimen Description 09/04/2014 URINE, CATHETERIZED   Final  . Special Requests 09/04/2014 Normal   Final  . Colony Count 09/04/2014    Final                   Value:70,000 COLONIES/ML Performed at [facility]   . Culture 09/04/2014    Final                   Value:KLEBSIELLA PNEUMONIAE Performed at [facility]   . Report Status 09/04/2014 09/06/2014 FINAL   Final  . Organism ID, Bacteria 09/04/2014 KLEBSIELLA PNEUMONIAE   Final  Appointment on 08/24/2014  Component Date Value Ref Range Status  . Campylobacter, PCR 08/24/2014 Negative   Final  . C. difficile Tox A/B, PCR 08/24/2014 Negative   Final  . E coli 0157, PCR 08/24/2014 Negative   Final  . E coli (ETEC) LT/ST PCR 08/24/2014 Negative   Final  . E coli (STEC) stx1/stx2, PCR 08/24/2014 Negative   Final  . Salmonella, PCR 08/24/2014 Negative   Final  . Shigella, PCR 08/24/2014 Negative   Final  . Norovirus, PCR 08/24/2014 Negative   Final  . Rotavirus A, PCR 08/24/2014 Negative   Final  . Giardia lamblia, PCR 08/24/2014 Negative   Final  . Cryptosporidium, PCR 08/24/2014 Negative   Final   Comment:     ** Normal Reference Range for each Analyte: Not Detected **         The detection and identification of specific gastrointestinal microbial nucleic acid from individuals exhibiting signs and symptoms of gastrointestinal infection aids in the diagnosis of gastrointestinal infection when used in conjunction with clinical evaluation, laboratory findings, and epidemiological information.   ** The xTAG Gastrointestinal  Pathogen Panel results are presumptive and must be confirmed by FDA-cleared tests or other acceptable reference methods.  The results of this test should not be used as the sole basis for diagnosis, treatment, or other patient management decisions.   Performed using the Luminex xTAG Gastrointestinal Pathogen Panel test kit.   . Lactoferrin 08/24/2014 POSITIVE   Final  Appointment on 08/24/2014  Component Date Value Ref Range Status  . WBC 08/24/2014 7.6  4.0 - 10.5 K/uL Final  . RBC 08/24/2014 4.04  3.87 - 5.11 Mil/uL Final  . Hemoglobin 08/24/2014 12.2  12.0 - 15.0 g/dL Final  . HCT 08/24/2014 36.6  36.0 - 46.0 % Final  . MCV 08/24/2014 90.6  78.0 - 100.0 fl Final  . MCHC 08/24/2014 33.3  30.0 - 36.0 g/dL Final  . RDW 08/24/2014 14.4  11.5 - 15.5 % Final  . Platelets 08/24/2014 194.0  150.0 - 400.0 K/uL Final  . Neutrophils Relative % 08/24/2014 49.6  43.0 - 77.0 % Final  . Lymphocytes Relative 08/24/2014 38.1  12.0 - 46.0 % Final  . Monocytes Relative 08/24/2014 9.6  3.0 - 12.0 % Final  . Eosinophils Relative 08/24/2014 2.1  0.0 - 5.0 % Final  . Basophils Relative 08/24/2014 0.6  0.0 - 3.0 % Final  . Neutro Abs 08/24/2014 3.7  1.4 - 7.7 K/uL Final  . Lymphs Abs 08/24/2014 2.9  0.7 - 4.0 K/uL Final  . Monocytes Absolute 08/24/2014 0.7  0.1 - 1.0 K/uL Final  . Eosinophils Absolute 08/24/2014 0.2  0.0 - 0.7 K/uL Final  . Basophils Absolute 08/24/2014 0.0  0.0 - 0.1 K/uL Final  . Sodium 08/24/2014 142  135 - 145 mEq/L Final  .  Potassium 08/24/2014 4.1  3.5 - 5.1 mEq/L Final  . Chloride 08/24/2014 108  96 - 112 mEq/L Final  . CO2 08/24/2014 28  19 - 32 mEq/L Final  . Glucose, Bld 08/24/2014 103* 70 - 99 mg/dL Final  . BUN 08/24/2014 22  6 - 23 mg/dL Final  . Creatinine, Ser 08/24/2014 0.93  0.40 - 1.20 mg/dL Final  . Calcium 08/24/2014 9.4  8.4 - 10.5 mg/dL Final  . GFR 08/24/2014 60.40  >60.00 mL/min Final  Appointment on 08/04/2014  Component Date Value Ref Range Status  .  Campylobacter Culture 08/04/2014 Final report   Final  . RESULT 1 08/04/2014 Comment   Final   No Campylobacter species isolated.  . Salmonella/Shigella Screen 08/04/2014 Final report   Final  . RESULT 1 08/04/2014 Comment   Final   No Salmonella or Shigella recovered.  . Campylobacter Culture 08/04/2014 Final report   Final   PLEASE REFER TO [phone]  . RESULT 1 08/04/2014 Comment   Final   No Campylobacter species isolated.  . E coli, Shiga toxin Assay 08/04/2014 Negative  Negative Final  . Ova + Parasite Exam 08/04/2014 Final report   Final   Comment: These results were obtained using wet preparation(s) and trichrome stained smear. This test does not include testing for Cryptosporidium parvum, Cyclospora, or Microsporidia.   Marland Kitchen RESULT 1 08/04/2014 Comment   Final   Comment: No ova, cysts, or parasites seen. One negative specimen does not rule out the possibility of a parasitic infection.   . C difficile by pcr 08/04/2014 Negative  Negative Final  Office Visit on 06/27/2014  Component Date Value Ref Range Status  . Glucose 06/27/2014 91  65 - 99 mg/dL Final  . BUN 06/27/2014 21  8 - 27 mg/dL Final  . Creatinine, Ser 06/27/2014 0.91  0.57 - 1.00 mg/dL Final  . GFR calc non Af Amer 06/27/2014 57* >59 mL/min/1.73 Final  . GFR calc Af Amer 06/27/2014 65  >59 mL/min/1.73 Final  . BUN/Creatinine Ratio 06/27/2014 23  11 - 26 Final  . Sodium 06/27/2014 143  134 - 144 mmol/L Final  . Potassium 06/27/2014 4.5  3.5 - 5.2 mmol/L Final  . Chloride 06/27/2014 103  97 - 108 mmol/L Final  . CO2 06/27/2014 25  18 - 29 mmol/L Final  . Calcium 06/27/2014 9.3  8.7 - 10.3 mg/dL Final  . Total Protein 06/27/2014 6.9  6.0 - 8.5 g/dL Final  . Albumin 06/27/2014 4.3  3.5 - 4.7 g/dL Final  . Globulin, Total 06/27/2014 2.6  1.5 - 4.5 g/dL Final  . Albumin/Globulin Ratio 06/27/2014 1.7  1.1 - 2.5 Final  . Total Bilirubin 06/27/2014 0.6  0.0 - 1.2 mg/dL Final    . Alkaline Phosphatase 06/27/2014  88  39 - 117 IU/L Final  . AST 06/27/2014 22  0 - 40 IU/L Final  . ALT 06/27/2014 12  0 - 32 IU/L Final  . Amylase 06/27/2014 89  31 - 124 U/L Final  . Lipase 06/27/2014 44  0 - 59 U/L Final   ER records reviewed. Weakness thought to be due to abx for UTI. No changes made in her meds  Assessment/Plan   ICD-9-CM ICD-10-CM   1. Weakness - improving 780.79 R53.1   2. Essential hypertension, benign - inadequately controlled 401.1 I10 losartan (COZAAR) 50 MG tablet  3. Vascular dementia, without behavioral disturbance - stable 290.40 F01.50   4.     Diarrhea - resolved   --increase  losartan to 1.5 tabs daily.  --Continue to hold cholesterol medicine. Continue all other medications as ordered  --Follow up in 2 mos for routine visit or sooner if need be.  --Continue PT/OT as directed. She is still a fall risk  --will need fasting lipid panel, CMP and CBC w diff   Cassi Jenne S. Perlie Gold  [facility] [address] [place], [place] [address] [phone] Office (Wednesdays and Fridays 8 AM - 5 PM) [phone] Cell (Monday-Friday 8 AM - 5 PM)

## 2014-09-16 NOTE — Patient Instructions (Signed)
Increase losartan to 1.5 tabs daily.  Continue to hold cholesterol medicine. Continue all other medications as ordered  Follow up in 2 mos for routine visit or sooner if need be.  Continue PT/OT as directed

## 2014-09-20 DIAGNOSIS — R260 Ataxic gait: Secondary | ICD-10-CM | POA: Diagnosis not present

## 2014-09-20 DIAGNOSIS — M6281 Muscle weakness (generalized): Secondary | ICD-10-CM | POA: Diagnosis not present

## 2014-09-20 DIAGNOSIS — R279 Unspecified lack of coordination: Secondary | ICD-10-CM | POA: Diagnosis not present

## 2014-09-20 DIAGNOSIS — R278 Other lack of coordination: Secondary | ICD-10-CM | POA: Diagnosis not present

## 2014-09-21 DIAGNOSIS — R279 Unspecified lack of coordination: Secondary | ICD-10-CM | POA: Diagnosis not present

## 2014-09-21 DIAGNOSIS — R260 Ataxic gait: Secondary | ICD-10-CM | POA: Diagnosis not present

## 2014-09-21 DIAGNOSIS — R278 Other lack of coordination: Secondary | ICD-10-CM | POA: Diagnosis not present

## 2014-09-21 DIAGNOSIS — M6281 Muscle weakness (generalized): Secondary | ICD-10-CM | POA: Diagnosis not present

## 2014-09-22 ENCOUNTER — Other Ambulatory Visit: Payer: Self-pay | Admitting: *Deleted

## 2014-09-22 DIAGNOSIS — R279 Unspecified lack of coordination: Secondary | ICD-10-CM | POA: Diagnosis not present

## 2014-09-22 DIAGNOSIS — R260 Ataxic gait: Secondary | ICD-10-CM | POA: Diagnosis not present

## 2014-09-22 DIAGNOSIS — R278 Other lack of coordination: Secondary | ICD-10-CM | POA: Diagnosis not present

## 2014-09-22 DIAGNOSIS — M6281 Muscle weakness (generalized): Secondary | ICD-10-CM | POA: Diagnosis not present

## 2014-09-22 MED ORDER — SACCHAROMYCES BOULARDII 250 MG PO CAPS: 250.0000 mg | ORAL_CAPSULE | Freq: Two times a day (BID) | ORAL | Status: DC

## 2014-09-22 MED ORDER — PHOSPHATIDYLSERINE-DHA-EPA 100-19.5-6.5 MG PO CAPS: 1.0000 | ORAL_CAPSULE | Freq: Every day | ORAL | Status: DC

## 2014-09-22 NOTE — Telephone Encounter (Signed)
Debbie nurse with [facility] called and stated that patient needed Refills. Faxed.

## 2014-09-23 DIAGNOSIS — R260 Ataxic gait: Secondary | ICD-10-CM | POA: Diagnosis not present

## 2014-09-23 DIAGNOSIS — M6281 Muscle weakness (generalized): Secondary | ICD-10-CM | POA: Diagnosis not present

## 2014-09-23 DIAGNOSIS — R278 Other lack of coordination: Secondary | ICD-10-CM | POA: Diagnosis not present

## 2014-09-23 DIAGNOSIS — R279 Unspecified lack of coordination: Secondary | ICD-10-CM | POA: Diagnosis not present

## 2014-09-26 DIAGNOSIS — R278 Other lack of coordination: Secondary | ICD-10-CM | POA: Diagnosis not present

## 2014-09-26 DIAGNOSIS — R279 Unspecified lack of coordination: Secondary | ICD-10-CM | POA: Diagnosis not present

## 2014-09-26 DIAGNOSIS — R260 Ataxic gait: Secondary | ICD-10-CM | POA: Diagnosis not present

## 2014-09-26 DIAGNOSIS — M6281 Muscle weakness (generalized): Secondary | ICD-10-CM | POA: Diagnosis not present

## 2014-09-27 DIAGNOSIS — M6281 Muscle weakness (generalized): Secondary | ICD-10-CM | POA: Diagnosis not present

## 2014-09-27 DIAGNOSIS — R279 Unspecified lack of coordination: Secondary | ICD-10-CM | POA: Diagnosis not present

## 2014-09-27 DIAGNOSIS — R278 Other lack of coordination: Secondary | ICD-10-CM | POA: Diagnosis not present

## 2014-09-27 DIAGNOSIS — R260 Ataxic gait: Secondary | ICD-10-CM | POA: Diagnosis not present

## 2014-09-28 DIAGNOSIS — M6281 Muscle weakness (generalized): Secondary | ICD-10-CM | POA: Diagnosis not present

## 2014-09-28 DIAGNOSIS — R260 Ataxic gait: Secondary | ICD-10-CM | POA: Diagnosis not present

## 2014-09-28 DIAGNOSIS — R279 Unspecified lack of coordination: Secondary | ICD-10-CM | POA: Diagnosis not present

## 2014-09-28 DIAGNOSIS — R278 Other lack of coordination: Secondary | ICD-10-CM | POA: Diagnosis not present

## 2014-09-30 DIAGNOSIS — M6281 Muscle weakness (generalized): Secondary | ICD-10-CM | POA: Diagnosis not present

## 2014-09-30 DIAGNOSIS — R279 Unspecified lack of coordination: Secondary | ICD-10-CM | POA: Diagnosis not present

## 2014-09-30 DIAGNOSIS — R260 Ataxic gait: Secondary | ICD-10-CM | POA: Diagnosis not present

## 2014-09-30 DIAGNOSIS — R278 Other lack of coordination: Secondary | ICD-10-CM | POA: Diagnosis not present

## 2014-10-03 DIAGNOSIS — M6281 Muscle weakness (generalized): Secondary | ICD-10-CM | POA: Diagnosis not present

## 2014-10-03 DIAGNOSIS — R279 Unspecified lack of coordination: Secondary | ICD-10-CM | POA: Diagnosis not present

## 2014-10-03 DIAGNOSIS — R260 Ataxic gait: Secondary | ICD-10-CM | POA: Diagnosis not present

## 2014-10-03 DIAGNOSIS — R278 Other lack of coordination: Secondary | ICD-10-CM | POA: Diagnosis not present

## 2014-10-04 DIAGNOSIS — R260 Ataxic gait: Secondary | ICD-10-CM | POA: Diagnosis not present

## 2014-10-04 DIAGNOSIS — R279 Unspecified lack of coordination: Secondary | ICD-10-CM | POA: Diagnosis not present

## 2014-10-04 DIAGNOSIS — R278 Other lack of coordination: Secondary | ICD-10-CM | POA: Diagnosis not present

## 2014-10-04 DIAGNOSIS — M6281 Muscle weakness (generalized): Secondary | ICD-10-CM | POA: Diagnosis not present

## 2014-10-05 DIAGNOSIS — R279 Unspecified lack of coordination: Secondary | ICD-10-CM | POA: Diagnosis not present

## 2014-10-05 DIAGNOSIS — R278 Other lack of coordination: Secondary | ICD-10-CM | POA: Diagnosis not present

## 2014-10-05 DIAGNOSIS — R260 Ataxic gait: Secondary | ICD-10-CM | POA: Diagnosis not present

## 2014-10-05 DIAGNOSIS — M6281 Muscle weakness (generalized): Secondary | ICD-10-CM | POA: Diagnosis not present

## 2014-10-06 DIAGNOSIS — R278 Other lack of coordination: Secondary | ICD-10-CM | POA: Diagnosis not present

## 2014-10-06 DIAGNOSIS — R260 Ataxic gait: Secondary | ICD-10-CM | POA: Diagnosis not present

## 2014-10-06 DIAGNOSIS — M6281 Muscle weakness (generalized): Secondary | ICD-10-CM | POA: Diagnosis not present

## 2014-10-06 DIAGNOSIS — R279 Unspecified lack of coordination: Secondary | ICD-10-CM | POA: Diagnosis not present

## 2014-10-07 ENCOUNTER — Other Ambulatory Visit: Payer: Self-pay | Admitting: *Deleted

## 2014-10-07 DIAGNOSIS — I10 Essential (primary) hypertension: Secondary | ICD-10-CM

## 2014-10-07 DIAGNOSIS — R278 Other lack of coordination: Secondary | ICD-10-CM | POA: Diagnosis not present

## 2014-10-07 DIAGNOSIS — M6281 Muscle weakness (generalized): Secondary | ICD-10-CM | POA: Diagnosis not present

## 2014-10-07 DIAGNOSIS — R260 Ataxic gait: Secondary | ICD-10-CM | POA: Diagnosis not present

## 2014-10-07 DIAGNOSIS — R279 Unspecified lack of coordination: Secondary | ICD-10-CM | POA: Diagnosis not present

## 2014-10-07 MED ORDER — DONEPEZIL HCL 5 MG PO TABS: 5.0000 mg | ORAL_TABLET | Freq: Every day | ORAL | Status: DC

## 2014-10-07 MED ORDER — ATORVASTATIN CALCIUM 10 MG PO TABS
10.0000 mg | ORAL_TABLET | Freq: Every day | ORAL | Status: DC
Start: 2014-10-07 — End: 2017-08-04

## 2014-10-07 MED ORDER — PHOSPHATIDYLSERINE-DHA-EPA 100-19.5-6.5 MG PO CAPS: 1.0000 | ORAL_CAPSULE | Freq: Every day | ORAL | Status: DC

## 2014-10-07 MED ORDER — COLESTIPOL HCL 1 G PO TABS: 1.0000 g | ORAL_TABLET | Freq: Every day | ORAL | Status: AC

## 2014-10-07 MED ORDER — LOSARTAN POTASSIUM 50 MG PO TABS: 75.0000 mg | ORAL_TABLET | Freq: Every day | ORAL | Status: DC

## 2014-10-07 MED ORDER — MELATONIN 3 MG PO CAPS: 1.0000 | ORAL_CAPSULE | Freq: Every evening | ORAL | Status: AC | PRN

## 2014-10-07 MED ORDER — SACCHAROMYCES BOULARDII 250 MG PO CAPS: 250.0000 mg | ORAL_CAPSULE | Freq: Two times a day (BID) | ORAL | Status: DC

## 2014-10-07 MED ORDER — METOPROLOL TARTRATE 50 MG PO TABS: 50.0000 mg | ORAL_TABLET | Freq: Two times a day (BID) | ORAL | Status: DC

## 2014-10-07 NOTE — Telephone Encounter (Signed)
Beverly Barnett with [facility] called and requested Rxs to be faxed to [facility]. Switching pharmacies.

## 2014-10-09 DIAGNOSIS — R279 Unspecified lack of coordination: Secondary | ICD-10-CM | POA: Diagnosis not present

## 2014-10-09 DIAGNOSIS — R41841 Cognitive communication deficit: Secondary | ICD-10-CM | POA: Diagnosis not present

## 2014-10-09 DIAGNOSIS — R260 Ataxic gait: Secondary | ICD-10-CM | POA: Diagnosis not present

## 2014-10-09 DIAGNOSIS — M6281 Muscle weakness (generalized): Secondary | ICD-10-CM | POA: Diagnosis not present

## 2014-10-10 DIAGNOSIS — R279 Unspecified lack of coordination: Secondary | ICD-10-CM | POA: Diagnosis not present

## 2014-10-10 DIAGNOSIS — M6281 Muscle weakness (generalized): Secondary | ICD-10-CM | POA: Diagnosis not present

## 2014-10-10 DIAGNOSIS — R41841 Cognitive communication deficit: Secondary | ICD-10-CM | POA: Diagnosis not present

## 2014-10-10 DIAGNOSIS — R260 Ataxic gait: Secondary | ICD-10-CM | POA: Diagnosis not present

## 2014-10-11 DIAGNOSIS — R41841 Cognitive communication deficit: Secondary | ICD-10-CM | POA: Diagnosis not present

## 2014-10-11 DIAGNOSIS — R260 Ataxic gait: Secondary | ICD-10-CM | POA: Diagnosis not present

## 2014-10-11 DIAGNOSIS — R279 Unspecified lack of coordination: Secondary | ICD-10-CM | POA: Diagnosis not present

## 2014-10-11 DIAGNOSIS — M6281 Muscle weakness (generalized): Secondary | ICD-10-CM | POA: Diagnosis not present

## 2014-10-13 DIAGNOSIS — R279 Unspecified lack of coordination: Secondary | ICD-10-CM | POA: Diagnosis not present

## 2014-10-13 DIAGNOSIS — R260 Ataxic gait: Secondary | ICD-10-CM | POA: Diagnosis not present

## 2014-10-13 DIAGNOSIS — M6281 Muscle weakness (generalized): Secondary | ICD-10-CM | POA: Diagnosis not present

## 2014-10-13 DIAGNOSIS — R41841 Cognitive communication deficit: Secondary | ICD-10-CM | POA: Diagnosis not present

## 2014-10-14 DIAGNOSIS — R279 Unspecified lack of coordination: Secondary | ICD-10-CM | POA: Diagnosis not present

## 2014-10-14 DIAGNOSIS — M6281 Muscle weakness (generalized): Secondary | ICD-10-CM | POA: Diagnosis not present

## 2014-10-14 DIAGNOSIS — R41841 Cognitive communication deficit: Secondary | ICD-10-CM | POA: Diagnosis not present

## 2014-10-14 DIAGNOSIS — R260 Ataxic gait: Secondary | ICD-10-CM | POA: Diagnosis not present

## 2014-10-17 DIAGNOSIS — M6281 Muscle weakness (generalized): Secondary | ICD-10-CM | POA: Diagnosis not present

## 2014-10-17 DIAGNOSIS — R41841 Cognitive communication deficit: Secondary | ICD-10-CM | POA: Diagnosis not present

## 2014-10-17 DIAGNOSIS — R279 Unspecified lack of coordination: Secondary | ICD-10-CM | POA: Diagnosis not present

## 2014-10-17 DIAGNOSIS — R260 Ataxic gait: Secondary | ICD-10-CM | POA: Diagnosis not present

## 2014-10-18 DIAGNOSIS — R41841 Cognitive communication deficit: Secondary | ICD-10-CM | POA: Diagnosis not present

## 2014-10-18 DIAGNOSIS — R279 Unspecified lack of coordination: Secondary | ICD-10-CM | POA: Diagnosis not present

## 2014-10-18 DIAGNOSIS — R260 Ataxic gait: Secondary | ICD-10-CM | POA: Diagnosis not present

## 2014-10-18 DIAGNOSIS — M6281 Muscle weakness (generalized): Secondary | ICD-10-CM | POA: Diagnosis not present

## 2014-10-19 DIAGNOSIS — M6281 Muscle weakness (generalized): Secondary | ICD-10-CM | POA: Diagnosis not present

## 2014-10-19 DIAGNOSIS — R41841 Cognitive communication deficit: Secondary | ICD-10-CM | POA: Diagnosis not present

## 2014-10-19 DIAGNOSIS — R260 Ataxic gait: Secondary | ICD-10-CM | POA: Diagnosis not present

## 2014-10-19 DIAGNOSIS — R279 Unspecified lack of coordination: Secondary | ICD-10-CM | POA: Diagnosis not present

## 2014-10-20 DIAGNOSIS — R279 Unspecified lack of coordination: Secondary | ICD-10-CM | POA: Diagnosis not present

## 2014-10-20 DIAGNOSIS — R41841 Cognitive communication deficit: Secondary | ICD-10-CM | POA: Diagnosis not present

## 2014-10-20 DIAGNOSIS — R260 Ataxic gait: Secondary | ICD-10-CM | POA: Diagnosis not present

## 2014-10-20 DIAGNOSIS — M6281 Muscle weakness (generalized): Secondary | ICD-10-CM | POA: Diagnosis not present

## 2014-10-21 DIAGNOSIS — R279 Unspecified lack of coordination: Secondary | ICD-10-CM | POA: Diagnosis not present

## 2014-10-21 DIAGNOSIS — R260 Ataxic gait: Secondary | ICD-10-CM | POA: Diagnosis not present

## 2014-10-21 DIAGNOSIS — M6281 Muscle weakness (generalized): Secondary | ICD-10-CM | POA: Diagnosis not present

## 2014-10-21 DIAGNOSIS — R41841 Cognitive communication deficit: Secondary | ICD-10-CM | POA: Diagnosis not present

## 2014-10-24 DIAGNOSIS — M6281 Muscle weakness (generalized): Secondary | ICD-10-CM | POA: Diagnosis not present

## 2014-10-24 DIAGNOSIS — R279 Unspecified lack of coordination: Secondary | ICD-10-CM | POA: Diagnosis not present

## 2014-10-24 DIAGNOSIS — R41841 Cognitive communication deficit: Secondary | ICD-10-CM | POA: Diagnosis not present

## 2014-10-24 DIAGNOSIS — R260 Ataxic gait: Secondary | ICD-10-CM | POA: Diagnosis not present

## 2014-10-25 DIAGNOSIS — M6281 Muscle weakness (generalized): Secondary | ICD-10-CM | POA: Diagnosis not present

## 2014-10-25 DIAGNOSIS — R41841 Cognitive communication deficit: Secondary | ICD-10-CM | POA: Diagnosis not present

## 2014-10-25 DIAGNOSIS — R260 Ataxic gait: Secondary | ICD-10-CM | POA: Diagnosis not present

## 2014-10-25 DIAGNOSIS — R279 Unspecified lack of coordination: Secondary | ICD-10-CM | POA: Diagnosis not present

## 2014-10-26 DIAGNOSIS — M6281 Muscle weakness (generalized): Secondary | ICD-10-CM | POA: Diagnosis not present

## 2014-10-26 DIAGNOSIS — R279 Unspecified lack of coordination: Secondary | ICD-10-CM | POA: Diagnosis not present

## 2014-10-26 DIAGNOSIS — R260 Ataxic gait: Secondary | ICD-10-CM | POA: Diagnosis not present

## 2014-10-26 DIAGNOSIS — R41841 Cognitive communication deficit: Secondary | ICD-10-CM | POA: Diagnosis not present

## 2014-10-28 DIAGNOSIS — R260 Ataxic gait: Secondary | ICD-10-CM | POA: Diagnosis not present

## 2014-10-28 DIAGNOSIS — M6281 Muscle weakness (generalized): Secondary | ICD-10-CM | POA: Diagnosis not present

## 2014-10-28 DIAGNOSIS — R279 Unspecified lack of coordination: Secondary | ICD-10-CM | POA: Diagnosis not present

## 2014-10-28 DIAGNOSIS — R41841 Cognitive communication deficit: Secondary | ICD-10-CM | POA: Diagnosis not present

## 2014-10-31 DIAGNOSIS — R260 Ataxic gait: Secondary | ICD-10-CM | POA: Diagnosis not present

## 2014-10-31 DIAGNOSIS — R41841 Cognitive communication deficit: Secondary | ICD-10-CM | POA: Diagnosis not present

## 2014-10-31 DIAGNOSIS — M6281 Muscle weakness (generalized): Secondary | ICD-10-CM | POA: Diagnosis not present

## 2014-10-31 DIAGNOSIS — R279 Unspecified lack of coordination: Secondary | ICD-10-CM | POA: Diagnosis not present

## 2014-11-01 DIAGNOSIS — R279 Unspecified lack of coordination: Secondary | ICD-10-CM | POA: Diagnosis not present

## 2014-11-01 DIAGNOSIS — R260 Ataxic gait: Secondary | ICD-10-CM | POA: Diagnosis not present

## 2014-11-01 DIAGNOSIS — M6281 Muscle weakness (generalized): Secondary | ICD-10-CM | POA: Diagnosis not present

## 2014-11-01 DIAGNOSIS — R41841 Cognitive communication deficit: Secondary | ICD-10-CM | POA: Diagnosis not present

## 2014-11-02 DIAGNOSIS — M6281 Muscle weakness (generalized): Secondary | ICD-10-CM | POA: Diagnosis not present

## 2014-11-02 DIAGNOSIS — R41841 Cognitive communication deficit: Secondary | ICD-10-CM | POA: Diagnosis not present

## 2014-11-02 DIAGNOSIS — R279 Unspecified lack of coordination: Secondary | ICD-10-CM | POA: Diagnosis not present

## 2014-11-02 DIAGNOSIS — R260 Ataxic gait: Secondary | ICD-10-CM | POA: Diagnosis not present

## 2014-11-03 DIAGNOSIS — M6281 Muscle weakness (generalized): Secondary | ICD-10-CM | POA: Diagnosis not present

## 2014-11-03 DIAGNOSIS — R41841 Cognitive communication deficit: Secondary | ICD-10-CM | POA: Diagnosis not present

## 2014-11-03 DIAGNOSIS — R279 Unspecified lack of coordination: Secondary | ICD-10-CM | POA: Diagnosis not present

## 2014-11-03 DIAGNOSIS — R260 Ataxic gait: Secondary | ICD-10-CM | POA: Diagnosis not present

## 2014-11-04 DIAGNOSIS — R41841 Cognitive communication deficit: Secondary | ICD-10-CM | POA: Diagnosis not present

## 2014-11-04 DIAGNOSIS — R260 Ataxic gait: Secondary | ICD-10-CM | POA: Diagnosis not present

## 2014-11-04 DIAGNOSIS — R279 Unspecified lack of coordination: Secondary | ICD-10-CM | POA: Diagnosis not present

## 2014-11-04 DIAGNOSIS — M6281 Muscle weakness (generalized): Secondary | ICD-10-CM | POA: Diagnosis not present

## 2014-11-10 DIAGNOSIS — R41841 Cognitive communication deficit: Secondary | ICD-10-CM | POA: Diagnosis not present

## 2014-11-10 DIAGNOSIS — M6281 Muscle weakness (generalized): Secondary | ICD-10-CM | POA: Diagnosis not present

## 2014-11-10 DIAGNOSIS — R260 Ataxic gait: Secondary | ICD-10-CM | POA: Diagnosis not present

## 2014-11-14 ENCOUNTER — Telehealth: Payer: Self-pay | Admitting: Internal Medicine

## 2014-11-14 NOTE — Telephone Encounter (Signed)
Received FL2 Form and filled out and given to Dr. Montez Moritaarter to review and sign. Patient is [facility] independent [facility]. #: [phone] Fax: [phone] Attn: Robbie LisMarian Barnett (Resident Relations Director)

## 2014-11-14 NOTE — Telephone Encounter (Signed)
Beverly HomesRuth Barnett"s daughter dropped off a FL2 form to be filled out by Dr. Montez Moritaarter. The form was placed in the rx tray. The patients daughter wants the form faxed to [facility] at  [phone] when ready.

## 2014-11-16 ENCOUNTER — Telehealth: Payer: Self-pay

## 2014-11-16 NOTE — Telephone Encounter (Signed)
Left message on voicemail informing patients daughter that patient needs to schedule a PPD, TB Skin Test appointment [facility] that was dropped off to be completed by Dr.Carter.   Side Note- Paperwork is on the ledge in Dr.Carter's filing tray

## 2014-11-17 ENCOUNTER — Telehealth: Payer: Self-pay

## 2014-11-17 NOTE — Telephone Encounter (Signed)
Daughter called, received a call yesterday to make appt to get PPD. This was done at [facility]. I got the forms and does require documentation about PPD. Asked her if she could have [facility] to fax [place] the PPD results, so we can enclose it with the form. The form goes to [facility] and without that information, it may delay her move. Daughter will have them fax now. Forms in Dr. Celene Skeen folder.

## 2014-11-21 DIAGNOSIS — R260 Ataxic gait: Secondary | ICD-10-CM | POA: Diagnosis not present

## 2014-11-21 DIAGNOSIS — M6281 Muscle weakness (generalized): Secondary | ICD-10-CM | POA: Diagnosis not present

## 2014-11-21 DIAGNOSIS — R41841 Cognitive communication deficit: Secondary | ICD-10-CM | POA: Diagnosis not present

## 2014-11-24 DIAGNOSIS — M6281 Muscle weakness (generalized): Secondary | ICD-10-CM | POA: Diagnosis not present

## 2014-11-24 DIAGNOSIS — R260 Ataxic gait: Secondary | ICD-10-CM | POA: Diagnosis not present

## 2014-11-24 DIAGNOSIS — R41841 Cognitive communication deficit: Secondary | ICD-10-CM | POA: Diagnosis not present

## 2014-11-25 ENCOUNTER — Ambulatory Visit: Payer: Medicare Other | Admitting: Internal Medicine

## 2014-11-28 DIAGNOSIS — R41841 Cognitive communication deficit: Secondary | ICD-10-CM | POA: Diagnosis not present

## 2014-11-28 DIAGNOSIS — M6281 Muscle weakness (generalized): Secondary | ICD-10-CM | POA: Diagnosis not present

## 2014-11-28 DIAGNOSIS — R260 Ataxic gait: Secondary | ICD-10-CM | POA: Diagnosis not present

## 2014-11-29 DIAGNOSIS — R260 Ataxic gait: Secondary | ICD-10-CM | POA: Diagnosis not present

## 2014-11-29 DIAGNOSIS — R41841 Cognitive communication deficit: Secondary | ICD-10-CM | POA: Diagnosis not present

## 2014-11-29 DIAGNOSIS — M6281 Muscle weakness (generalized): Secondary | ICD-10-CM | POA: Diagnosis not present

## 2014-11-30 DIAGNOSIS — R41841 Cognitive communication deficit: Secondary | ICD-10-CM | POA: Diagnosis not present

## 2014-11-30 DIAGNOSIS — R260 Ataxic gait: Secondary | ICD-10-CM | POA: Diagnosis not present

## 2014-11-30 DIAGNOSIS — M6281 Muscle weakness (generalized): Secondary | ICD-10-CM | POA: Diagnosis not present

## 2014-12-02 DIAGNOSIS — M6281 Muscle weakness (generalized): Secondary | ICD-10-CM | POA: Diagnosis not present

## 2014-12-02 DIAGNOSIS — R260 Ataxic gait: Secondary | ICD-10-CM | POA: Diagnosis not present

## 2014-12-02 DIAGNOSIS — R41841 Cognitive communication deficit: Secondary | ICD-10-CM | POA: Diagnosis not present

## 2014-12-05 DIAGNOSIS — R41841 Cognitive communication deficit: Secondary | ICD-10-CM | POA: Diagnosis not present

## 2014-12-05 DIAGNOSIS — R260 Ataxic gait: Secondary | ICD-10-CM | POA: Diagnosis not present

## 2014-12-05 DIAGNOSIS — M6281 Muscle weakness (generalized): Secondary | ICD-10-CM | POA: Diagnosis not present

## 2014-12-06 DIAGNOSIS — R260 Ataxic gait: Secondary | ICD-10-CM | POA: Diagnosis not present

## 2014-12-06 DIAGNOSIS — R41841 Cognitive communication deficit: Secondary | ICD-10-CM | POA: Diagnosis not present

## 2014-12-06 DIAGNOSIS — M6281 Muscle weakness (generalized): Secondary | ICD-10-CM | POA: Diagnosis not present

## 2014-12-07 DIAGNOSIS — M6281 Muscle weakness (generalized): Secondary | ICD-10-CM | POA: Diagnosis not present

## 2014-12-07 DIAGNOSIS — R260 Ataxic gait: Secondary | ICD-10-CM | POA: Diagnosis not present

## 2014-12-07 DIAGNOSIS — R41841 Cognitive communication deficit: Secondary | ICD-10-CM | POA: Diagnosis not present

## 2014-12-08 DIAGNOSIS — R260 Ataxic gait: Secondary | ICD-10-CM | POA: Diagnosis not present

## 2014-12-08 DIAGNOSIS — R41841 Cognitive communication deficit: Secondary | ICD-10-CM | POA: Diagnosis not present

## 2014-12-08 DIAGNOSIS — M6281 Muscle weakness (generalized): Secondary | ICD-10-CM | POA: Diagnosis not present

## 2014-12-09 ENCOUNTER — Telehealth: Payer: Self-pay

## 2014-12-09 ENCOUNTER — Ambulatory Visit (INDEPENDENT_AMBULATORY_CARE_PROVIDER_SITE_OTHER): Payer: Medicare Other | Admitting: Internal Medicine

## 2014-12-09 ENCOUNTER — Ambulatory Visit
Admission: RE | Admit: 2014-12-09 | Discharge: 2014-12-09 | Disposition: A | Discharge status: Home / Self Care | Payer: Medicare Other | Source: Ambulatory Visit | Attending: Internal Medicine | Admitting: Internal Medicine

## 2014-12-09 ENCOUNTER — Encounter: Payer: Self-pay | Admitting: Internal Medicine

## 2014-12-09 VITALS — BP 138/84 | HR 62 | Temp 98.2°F | Resp 16 | Ht 65.0 in | Wt 115.0 lb

## 2014-12-09 DIAGNOSIS — M810 Age-related osteoporosis without current pathological fracture: Secondary | ICD-10-CM

## 2014-12-09 DIAGNOSIS — M6281 Muscle weakness (generalized): Secondary | ICD-10-CM | POA: Diagnosis not present

## 2014-12-09 DIAGNOSIS — G309 Alzheimer's disease, unspecified: Secondary | ICD-10-CM

## 2014-12-09 DIAGNOSIS — M25561 Pain in right knee: Secondary | ICD-10-CM

## 2014-12-09 DIAGNOSIS — R260 Ataxic gait: Secondary | ICD-10-CM | POA: Diagnosis not present

## 2014-12-09 DIAGNOSIS — Z9181 History of falling: Secondary | ICD-10-CM | POA: Diagnosis not present

## 2014-12-09 DIAGNOSIS — F0150 Vascular dementia without behavioral disturbance: Secondary | ICD-10-CM | POA: Diagnosis not present

## 2014-12-09 DIAGNOSIS — F0280 Dementia in other diseases classified elsewhere without behavioral disturbance: Secondary | ICD-10-CM

## 2014-12-09 DIAGNOSIS — M25562 Pain in left knee: Secondary | ICD-10-CM | POA: Diagnosis not present

## 2014-12-09 DIAGNOSIS — R41841 Cognitive communication deficit: Secondary | ICD-10-CM | POA: Diagnosis not present

## 2014-12-09 DIAGNOSIS — G4700 Insomnia, unspecified: Secondary | ICD-10-CM | POA: Diagnosis not present

## 2014-12-09 DIAGNOSIS — S8992XA Unspecified injury of left lower leg, initial encounter: Secondary | ICD-10-CM | POA: Diagnosis not present

## 2014-12-09 DIAGNOSIS — R296 Repeated falls: Secondary | ICD-10-CM | POA: Diagnosis not present

## 2014-12-09 DIAGNOSIS — E785 Hyperlipidemia, unspecified: Secondary | ICD-10-CM

## 2014-12-09 DIAGNOSIS — M25551 Pain in right hip: Secondary | ICD-10-CM | POA: Diagnosis not present

## 2014-12-09 DIAGNOSIS — S79911A Unspecified injury of right hip, initial encounter: Secondary | ICD-10-CM | POA: Diagnosis not present

## 2014-12-09 DIAGNOSIS — R278 Other lack of coordination: Secondary | ICD-10-CM | POA: Diagnosis not present

## 2014-12-09 DIAGNOSIS — I10 Essential (primary) hypertension: Secondary | ICD-10-CM

## 2014-12-09 NOTE — Patient Instructions (Signed)
Use walker when walking around in home and outside  Continue current medications as ordered  Refer to PT/OT for gait training and balance  Follow up as scheduled

## 2014-12-09 NOTE — Telephone Encounter (Signed)
Bukky from [facility] called to let us know that patient fell 1:53, no pain, no bruise at this time. Patient doesn't know why she fell, her walker was 6-7 feet away.  FYI

## 2014-12-09 NOTE — Progress Notes (Signed)
Patient ID: Beverly Barnett, female   DOB: 03-02-1924, 81 y.o.   MRN: [id number]    Location:    [facility]   Place of Service:   OFFICE  Chief Complaint  Patient presents with  . Medical Management of Chronic Issues    2 month follow-up. Not fasting   . Gait Problem    Patient with problems walking    HPI:  81 yo female seen today for f/u. Beverly Barnett, present today. She has difficulty walking and does not take a full stride. She has fallen 3 times in the last 12 mos. She uses her walker when she is outside her home and only occaisonally in her home. She no longer drives. She does not get PT at ALF. She has osteoporosis and is not currently taking a bisphosphonate  She is a poor historian due to dementia. Hx obtained from grandson.  Past Medical History  Diagnosis Date  . Hypertension   . High cholesterol   . Osteoporosis   . Type II or unspecified type diabetes mellitus without mention of complication, not stated as uncontrolled   . Hyperosmolality and/or hypernatremia   . Dysphagia, unspecified(787.20)   . Other atopic dermatitis and related conditions   . Encounter for long-term (current) use of other medications   . Cardiac catheterisation as the cause of abnormal reaction of patient, or of later complication 21/19/4174  . Vascular dementia 06/22/2013    Past Surgical History  Procedure Laterality Date  . Femur surgery Right 03/2010  . Shoulder surgery Left 03/2010  . Femur im nail  07/24/2011    Procedure: INTRAMEDULLARY (IM) NAIL FEMORAL;  Surgeon: Johnn Hai, MD;  Location: [facility];  Service: Orthopedics;  Laterality: Right;  Biomet, c-arm, Jackson table  . Intramedullary (im) nail intertrochanteric Left 07/30/2013    Procedure: INTRAMEDULLARY (IM) NAIL INTERTROCHANTRIC LEFT;  Surgeon: Mauri Pole, MD;  Location: [facility];  Service: Orthopedics;  Laterality: Left;    Patient Care Team: Gildardo Cranker, DO as PCP - General (Internal Medicine)  History   Social History   . Marital Status: Widowed    Spouse Name: N/A  . Number of Children: N/A  . Years of Education: N/A   Occupational History  . Not on file.   Social History Main Topics  . Smoking status: Never Smoker   . Smokeless tobacco: Never Used  . Alcohol Use: No  . Drug Use: No  . Sexual Activity: Not on file   Other Topics Concern  . Not on file   Social History Narrative     reports that she has never smoked. She has never used smokeless tobacco. She reports that she does not drink alcohol or use illicit drugs.  No Known Allergies  Medications: Patient's Medications  New Prescriptions   No medications on file  Previous Medications   ATORVASTATIN (LIPITOR) 10 MG TABLET    Take 1 tablet (10 mg total) by mouth daily.   COENZYME Q10 (CO Q 10) 100 MG CAPS    Take 1 tablet by mouth 2 (two) times daily.   COLESTIPOL (COLESTID) 1 G TABLET    Take 1 tablet (1 g total) by mouth daily.   DIPHENOXYLATE-ATROPINE (LOMOTIL) 2.5-0.025 MG PER TABLET    Take 1 tablet by mouth 3 (three) times daily.   DONEPEZIL (ARICEPT) 5 MG TABLET    Take 1 tablet (5 mg total) by mouth at bedtime.   LOSARTAN (COZAAR) 50 MG TABLET    Take 1.5  tablets (75 mg total) by mouth daily. for blood pressure   MELATONIN 3 MG CAPS    Take 1 capsule (3 mg total) by mouth at bedtime and may repeat dose one time if needed.   METOPROLOL (LOPRESSOR) 50 MG TABLET    Take 1 tablet (50 mg total) by mouth 2 (two) times daily.   MULTIPLE VITAMIN (MULITIVITAMIN WITH MINERALS) TABS    Take 1 tablet by mouth daily. 2 in am, 1 in pm   PHOSPHATIDYLSERINE-DHA-EPA 100-19.5-6.5 MG CAPS    Take 1 capsule by mouth daily.   SACCHAROMYCES BOULARDII (FLORASTOR) 250 MG CAPSULE    Take 1 capsule (250 mg total) by mouth 2 (two) times daily.  Modified Medications   No medications on file  Discontinued Medications   CEPHALEXIN (KEFLEX) 500 MG CAPSULE    Take 1 capsule (500 mg total) by mouth 3 (three) times daily.    Review of Systems  Unable to  perform ROS: Dementia    Filed Vitals:   12/09/14 1043  BP: 138/84  Pulse: 62  Temp: 98.2 F (36.8 C)  TempSrc: Oral  Resp: 16  Height: _0  (1.651 m)  Weight: 115 lb (52.164 kg)  SpO2: 97%   Body mass index is 19.14 kg/(m^2).  Physical Exam  Constitutional: She appears well-developed. No distress.  frail appearing in NAD  HENT:  Mouth/Throat: Oropharynx is clear and moist. No oropharyngeal exudate.  Eyes: Pupils are equal, round, and reactive to light. No scleral icterus.  Neck: Neck supple. Carotid bruit is not present. No tracheal deviation present. No thyromegaly present.  Cardiovascular: Normal rate, regular rhythm and intact distal pulses.  Exam reveals no gallop and no friction rub.   Murmur (1/6 SEM) heard. No LE edema b/l. no calf TTP.   Pulmonary/Chest: Effort normal and breath sounds normal. No stridor. No respiratory distress. She has no wheezes. She has no rales.  Musculoskeletal: She exhibits edema and tenderness.  Right knee swelling with crepitus and reduced ROM; gait unsteady and dragging RLE with stride;   Lymphadenopathy:    She has no cervical adenopathy.  Neurological: She is alert.  Skin: Skin is warm and dry. No rash noted.  Psychiatric: She has a normal mood and affect. Her speech is normal and behavior is normal. Cognition and memory are impaired.  confused     Labs reviewed: No visits with results within 3 Month(s) from this visit. Latest known visit with results is:  Admission on 09/08/2014, Discharged on 09/08/2014  Component Date Value Ref Range Status  . Sodium 09/08/2014 140  135 - 145 mmol/L Final  . Potassium 09/08/2014 4.2  3.5 - 5.1 mmol/L Final  . Chloride 09/08/2014 107  96 - 112 mmol/L Final  . CO2 09/08/2014 26  19 - 32 mmol/L Final  . Glucose, Bld 09/08/2014 117* 70 - 99 mg/dL Final  . BUN 09/08/2014 18  6 - 23 mg/dL Final  . Creatinine, Ser 09/08/2014 0.85  0.50 - 1.10 mg/dL Final  . Calcium 09/08/2014 9.2  8.4 - 10.5 mg/dL  Final  . GFR calc non Af Amer 09/08/2014 59* >90 mL/min Final  . GFR calc Af Amer 09/08/2014 69* >90 mL/min Final   Comment: (NOTE) The eGFR has been calculated using the CKD EPI equation. This calculation has not been validated in all clinical situations. eGFR's persistently <90 mL/min signify possible Chronic Kidney Disease.   . Anion gap 09/08/2014 7  5 - 15 Final  . WBC 09/08/2014 7.2  4.0 - 10.5 K/uL Final  . RBC 09/08/2014 3.97  3.87 - 5.11 MIL/uL Final  . Hemoglobin 09/08/2014 12.1  12.0 - 15.0 g/dL Final  . HCT 09/08/2014 37.7  36.0 - 46.0 % Final  . MCV 09/08/2014 95.0  78.0 - 100.0 fL Final  . MCH 09/08/2014 30.5  26.0 - 34.0 pg Final  . MCHC 09/08/2014 32.1  30.0 - 36.0 g/dL Final  . RDW 09/08/2014 14.2  11.5 - 15.5 % Final  . Platelets 09/08/2014 209  150 - 400 K/uL Final  . Neutrophils Relative % 09/08/2014 50  43 - 77 % Final  . Neutro Abs 09/08/2014 3.6  1.7 - 7.7 K/uL Final  . Lymphocytes Relative 09/08/2014 37  12 - 46 % Final  . Lymphs Abs 09/08/2014 2.7  0.7 - 4.0 K/uL Final  . Monocytes Relative 09/08/2014 10  3 - 12 % Final  . Monocytes Absolute 09/08/2014 0.7  0.1 - 1.0 K/uL Final  . Eosinophils Relative 09/08/2014 3  0 - 5 % Final  . Eosinophils Absolute 09/08/2014 0.2  0.0 - 0.7 K/uL Final  . Basophils Relative 09/08/2014 0  0 - 1 % Final  . Basophils Absolute 09/08/2014 0.0  0.0 - 0.1 K/uL Final  . Specimen Description 09/08/2014 URINE, CATHETERIZED   Final  . Special Requests 09/08/2014 NONE   Final  . Colony Count 09/08/2014    Final                   Value:NO GROWTH Performed at [facility]   . Culture 09/08/2014    Final                   Value:NO GROWTH Performed at [facility]   . Report Status 09/08/2014 09/10/2014 FINAL   Final  . Color, Urine 09/08/2014 YELLOW  YELLOW Final  . APPearance 09/08/2014 CLEAR  CLEAR Final  . Specific Gravity, Urine 09/08/2014 1.007  1.005 - 1.030 Final  . pH 09/08/2014 6.5  5.0 - 8.0 Final  .  Glucose, UA 09/08/2014 NEGATIVE  NEGATIVE mg/dL Final  . Hgb urine dipstick 09/08/2014 NEGATIVE  NEGATIVE Final  . Bilirubin Urine 09/08/2014 NEGATIVE  NEGATIVE Final  . Ketones, ur 09/08/2014 NEGATIVE  NEGATIVE mg/dL Final  . Protein, ur 09/08/2014 NEGATIVE  NEGATIVE mg/dL Final  . Urobilinogen, UA 09/08/2014 0.2  0.0 - 1.0 mg/dL Final  . Nitrite 09/08/2014 NEGATIVE  NEGATIVE Final  . Leukocytes, UA 09/08/2014 NEGATIVE  NEGATIVE Final   MICROSCOPIC NOT DONE ON URINES WITH NEGATIVE PROTEIN, BLOOD, LEUKOCYTES, NITRITE, OR GLUCOSE <1000 mg/dL.    No results found.   Assessment/Plan   ICD-9-CM ICD-10-CM   1. Right hip pain 719.45 M25.551 DG HIP UNILAT WITH PELVIS 2-3 VIEWS RIGHT  2. Frequent falls V15.88 R29.6 DG HIP UNILAT WITH PELVIS 2-3 VIEWS RIGHT  3. Osteoporosis 733.00 M81.0 DG HIP UNILAT WITH PELVIS 2-3 VIEWS RIGHT  4. Mixed Alzheimer's and vascular dementia - stable 331.0 G30.9 CMP   294.10 F01.50    290.40 F02.80   5. Essential hypertension - stable 401.9 I10 CBC with Differential  6. Hyperlipidemia with target LDL less than 130 - stable 272.4 E78.5 CMP     Lipid Panel  7. Insomnia - stable 780.52 G47.00   8. Right knee pain - s/p fall 719.46 M25.561 DG Knee Complete 4 Views Left   --Use walker when walking around in home and outside  --Continue current medications as ordered  --Refer to  PT/OT for gait training and balance  --Follow up as scheduled  Jniya Madara S. Perlie Gold  [facility] [address] [place], [place] [address] [phone] Cell (Monday-Friday 8 AM - 5 PM) [phone] After 5 PM and follow prompts

## 2014-12-10 LAB — CBC WITH DIFFERENTIAL/PLATELET
Basophils Absolute: 0 10*3/uL (ref 0.0–0.2)
Basos: 1 %
EOS (ABSOLUTE): 0.2 10*3/uL (ref 0.0–0.4)
Eos: 3 %
Hematocrit: 36.8 % (ref 34.0–46.6)
Hemoglobin: 12.4 g/dL (ref 11.1–15.9)
IMMATURE GRANS (ABS): 0 10*3/uL (ref 0.0–0.1)
IMMATURE GRANULOCYTES: 0 %
Lymphocytes Absolute: 2.9 10*3/uL (ref 0.7–3.1)
Lymphs: 37 %
MCH: 30.8 pg (ref 26.6–33.0)
MCHC: 33.7 g/dL (ref 31.5–35.7)
MCV: 91 fL (ref 79–97)
MONOCYTES: 12 %
Monocytes Absolute: 0.9 10*3/uL (ref 0.1–0.9)
Neutrophils Absolute: 3.9 10*3/uL (ref 1.4–7.0)
Neutrophils: 47 %
PLATELETS: 246 10*3/uL (ref 150–379)
RBC: 4.03 x10E6/uL (ref 3.77–5.28)
RDW: 13.9 % (ref 12.3–15.4)
WBC: 8 10*3/uL (ref 3.4–10.8)

## 2014-12-10 LAB — COMPREHENSIVE METABOLIC PANEL
A/G RATIO: 1.3 (ref 1.1–2.5)
ALT: 12 IU/L (ref 0–32)
AST: 16 IU/L (ref 0–40)
Albumin: 4 g/dL (ref 3.5–4.7)
Alkaline Phosphatase: 71 IU/L (ref 39–117)
BUN / CREAT RATIO: 24 (ref 11–26)
BUN: 23 mg/dL (ref 8–27)
Bilirubin Total: 0.4 mg/dL (ref 0.0–1.2)
CO2: 25 mmol/L (ref 18–29)
Calcium: 9.6 mg/dL (ref 8.7–10.3)
Chloride: 102 mmol/L (ref 97–108)
Creatinine, Ser: 0.94 mg/dL (ref 0.57–1.00)
GFR calc Af Amer: 63 mL/min/{1.73_m2} (ref >59)
GFR, EST NON AFRICAN AMERICAN: 54 mL/min/{1.73_m2} — AB (ref >59)
Globulin, Total: 3 g/dL (ref 1.5–4.5)
Glucose: 95 mg/dL (ref 65–99)
POTASSIUM: 4.8 mmol/L (ref 3.5–5.2)
Sodium: 142 mmol/L (ref 134–144)
Total Protein: 7 g/dL (ref 6.0–8.5)

## 2014-12-10 LAB — LIPID PANEL
CHOL/HDL RATIO: 5.5 ratio — AB (ref 0.0–4.4)
CHOLESTEROL TOTAL: 224 mg/dL — AB (ref 100–199)
HDL: 41 mg/dL (ref >39)
LDL CALC: 106 mg/dL — AB (ref 0–99)
Triglycerides: 385 mg/dL — ABNORMAL HIGH (ref 0–149)
VLDL CHOLESTEROL CAL: 77 mg/dL — AB (ref 5–40)

## 2014-12-13 ENCOUNTER — Other Ambulatory Visit: Payer: Self-pay

## 2014-12-13 DIAGNOSIS — M6281 Muscle weakness (generalized): Secondary | ICD-10-CM | POA: Diagnosis not present

## 2014-12-13 DIAGNOSIS — R260 Ataxic gait: Secondary | ICD-10-CM | POA: Diagnosis not present

## 2014-12-13 DIAGNOSIS — M25561 Pain in right knee: Secondary | ICD-10-CM

## 2014-12-13 DIAGNOSIS — I729 Aneurysm of unspecified site: Secondary | ICD-10-CM

## 2014-12-13 DIAGNOSIS — Z9181 History of falling: Secondary | ICD-10-CM | POA: Diagnosis not present

## 2014-12-13 DIAGNOSIS — R278 Other lack of coordination: Secondary | ICD-10-CM | POA: Diagnosis not present

## 2014-12-13 DIAGNOSIS — R41841 Cognitive communication deficit: Secondary | ICD-10-CM | POA: Diagnosis not present

## 2014-12-15 DIAGNOSIS — R278 Other lack of coordination: Secondary | ICD-10-CM | POA: Diagnosis not present

## 2014-12-15 DIAGNOSIS — R260 Ataxic gait: Secondary | ICD-10-CM | POA: Diagnosis not present

## 2014-12-15 DIAGNOSIS — R41841 Cognitive communication deficit: Secondary | ICD-10-CM | POA: Diagnosis not present

## 2014-12-15 DIAGNOSIS — Z9181 History of falling: Secondary | ICD-10-CM | POA: Diagnosis not present

## 2014-12-15 DIAGNOSIS — M6281 Muscle weakness (generalized): Secondary | ICD-10-CM | POA: Diagnosis not present

## 2014-12-16 ENCOUNTER — Other Ambulatory Visit: Payer: Self-pay

## 2014-12-16 DIAGNOSIS — M79605 Pain in left leg: Secondary | ICD-10-CM

## 2014-12-19 ENCOUNTER — Other Ambulatory Visit: Payer: Self-pay | Admitting: *Deleted

## 2014-12-19 DIAGNOSIS — Z9181 History of falling: Secondary | ICD-10-CM | POA: Diagnosis not present

## 2014-12-19 DIAGNOSIS — R41841 Cognitive communication deficit: Secondary | ICD-10-CM | POA: Diagnosis not present

## 2014-12-19 DIAGNOSIS — R260 Ataxic gait: Secondary | ICD-10-CM | POA: Diagnosis not present

## 2014-12-19 DIAGNOSIS — R278 Other lack of coordination: Secondary | ICD-10-CM | POA: Diagnosis not present

## 2014-12-19 DIAGNOSIS — M6281 Muscle weakness (generalized): Secondary | ICD-10-CM | POA: Diagnosis not present

## 2014-12-19 MED ORDER — DIPHENOXYLATE-ATROPINE 2.5-0.025 MG PO TABS: ORAL_TABLET | ORAL | Status: DC

## 2014-12-19 NOTE — Telephone Encounter (Signed)
[facility]

## 2014-12-20 ENCOUNTER — Other Ambulatory Visit: Payer: Self-pay | Admitting: *Deleted

## 2014-12-20 DIAGNOSIS — R260 Ataxic gait: Secondary | ICD-10-CM | POA: Diagnosis not present

## 2014-12-20 DIAGNOSIS — R41841 Cognitive communication deficit: Secondary | ICD-10-CM | POA: Diagnosis not present

## 2014-12-20 DIAGNOSIS — M6281 Muscle weakness (generalized): Secondary | ICD-10-CM | POA: Diagnosis not present

## 2014-12-20 DIAGNOSIS — Z9181 History of falling: Secondary | ICD-10-CM | POA: Diagnosis not present

## 2014-12-20 DIAGNOSIS — R278 Other lack of coordination: Secondary | ICD-10-CM | POA: Diagnosis not present

## 2014-12-20 MED ORDER — DIPHENOXYLATE-ATROPINE 2.5-0.025 MG PO TABS: ORAL_TABLET | ORAL | Status: DC

## 2014-12-20 NOTE — Telephone Encounter (Signed)
[facility] 

## 2014-12-21 DIAGNOSIS — R278 Other lack of coordination: Secondary | ICD-10-CM | POA: Diagnosis not present

## 2014-12-21 DIAGNOSIS — M6281 Muscle weakness (generalized): Secondary | ICD-10-CM | POA: Diagnosis not present

## 2014-12-21 DIAGNOSIS — R260 Ataxic gait: Secondary | ICD-10-CM | POA: Diagnosis not present

## 2014-12-21 DIAGNOSIS — Z9181 History of falling: Secondary | ICD-10-CM | POA: Diagnosis not present

## 2014-12-21 DIAGNOSIS — R41841 Cognitive communication deficit: Secondary | ICD-10-CM | POA: Diagnosis not present

## 2014-12-22 DIAGNOSIS — M6281 Muscle weakness (generalized): Secondary | ICD-10-CM | POA: Diagnosis not present

## 2014-12-22 DIAGNOSIS — R260 Ataxic gait: Secondary | ICD-10-CM | POA: Diagnosis not present

## 2014-12-22 DIAGNOSIS — R41841 Cognitive communication deficit: Secondary | ICD-10-CM | POA: Diagnosis not present

## 2014-12-22 DIAGNOSIS — Z9181 History of falling: Secondary | ICD-10-CM | POA: Diagnosis not present

## 2014-12-22 DIAGNOSIS — R278 Other lack of coordination: Secondary | ICD-10-CM | POA: Diagnosis not present

## 2014-12-28 DIAGNOSIS — M6281 Muscle weakness (generalized): Secondary | ICD-10-CM | POA: Diagnosis not present

## 2014-12-28 DIAGNOSIS — R260 Ataxic gait: Secondary | ICD-10-CM | POA: Diagnosis not present

## 2014-12-28 DIAGNOSIS — Z9181 History of falling: Secondary | ICD-10-CM | POA: Diagnosis not present

## 2014-12-28 DIAGNOSIS — R278 Other lack of coordination: Secondary | ICD-10-CM | POA: Diagnosis not present

## 2014-12-28 DIAGNOSIS — R41841 Cognitive communication deficit: Secondary | ICD-10-CM | POA: Diagnosis not present

## 2014-12-30 DIAGNOSIS — R260 Ataxic gait: Secondary | ICD-10-CM | POA: Diagnosis not present

## 2014-12-30 DIAGNOSIS — Z9181 History of falling: Secondary | ICD-10-CM | POA: Diagnosis not present

## 2014-12-30 DIAGNOSIS — R278 Other lack of coordination: Secondary | ICD-10-CM | POA: Diagnosis not present

## 2014-12-30 DIAGNOSIS — R41841 Cognitive communication deficit: Secondary | ICD-10-CM | POA: Diagnosis not present

## 2014-12-30 DIAGNOSIS — M6281 Muscle weakness (generalized): Secondary | ICD-10-CM | POA: Diagnosis not present

## 2015-01-02 ENCOUNTER — Encounter: Payer: Self-pay | Admitting: Internal Medicine

## 2015-01-02 ENCOUNTER — Ambulatory Visit (INDEPENDENT_AMBULATORY_CARE_PROVIDER_SITE_OTHER): Payer: Medicare Other | Admitting: Internal Medicine

## 2015-01-02 VITALS — BP 120/66 | HR 73 | Temp 97.7°F | Resp 20 | Ht 65.0 in | Wt 114.8 lb

## 2015-01-02 DIAGNOSIS — N939 Abnormal uterine and vaginal bleeding, unspecified: Secondary | ICD-10-CM

## 2015-01-02 NOTE — Progress Notes (Signed)
Patient ID: Beverly Barnett, female   DOB: 01/28/1924, 81 y.o.   MRN: [id number]   Location:  [facility]  Code Status: full code Goals of Care: Advanced Directive information Does patient have an advance directive?: No Should have at least hcpoa designated  Chief Complaint  Patient presents with  . Acute Visit    vaginal bleeding    HPI: Patient is a 81 y.o. white female seen in the office today for an acute visit for an episode of vaginal bleeding. Here with her grandson.   Lives at [facility].   She had an episode of vaginal bleeding last week.  She says it was a single episode and not that much blood.  She has had no abdominal pain, melena, hematochezia, dysuria, urgency, frequency.  She is not lightheaded or dizzy.  This has not recurred.  She is afebrile.  Her weight is stable.    Review of Systems:  Review of Systems  Constitutional: Negative for fever and chills.  Gastrointestinal: Negative for abdominal pain, blood in stool and melena.  Genitourinary: Negative for dysuria, urgency, frequency and hematuria.       One episode of vaginal bleeding  Musculoskeletal: Positive for falls.    Past Medical History  Diagnosis Date  . Hypertension   . High cholesterol   . Osteoporosis   . Type II or unspecified type diabetes mellitus without mention of complication, not stated as uncontrolled   . Hyperosmolality and/or hypernatremia   . Dysphagia, unspecified(787.20)   . Other atopic dermatitis and related conditions   . Encounter for long-term (current) use of other medications   . Cardiac catheterisation as the cause of abnormal reaction of patient, or of later complication 10/05/2006  . Vascular dementia 06/22/2013    Past Surgical History  Procedure Laterality Date  . Femur surgery Right 03/2010  . Shoulder surgery Left 03/2010  . Femur im nail  07/24/2011    Procedure: INTRAMEDULLARY (IM) NAIL FEMORAL;  Surgeon: Javier Docker,  MD;  Location: [facility];  Service: Orthopedics;  Laterality: Right;  Biomet, c-arm, Jackson table  . Intramedullary (im) nail intertrochanteric Left 07/30/2013    Procedure: INTRAMEDULLARY (IM) NAIL INTERTROCHANTRIC LEFT;  Surgeon: Shelda Pal, MD;  Location: [facility];  Service: Orthopedics;  Laterality: Left;    No Known Allergies Medications: Patient's Medications  New Prescriptions   No medications on file  Previous Medications   ATORVASTATIN (LIPITOR) 10 MG TABLET    Take 1 tablet (10 mg total) by mouth daily.   COENZYME Q10 (CO Q 10) 100 MG CAPS    Take 1 tablet by mouth 2 (two) times daily.   COLESTIPOL (COLESTID) 1 G TABLET    Take 1 tablet (1 g total) by mouth daily.   DIPHENOXYLATE-ATROPINE (LOMOTIL) 2.5-0.025 MG PER TABLET    Take one tablet by mouth three times daily as needed for diarrhea (control)   DONEPEZIL (ARICEPT) 5 MG TABLET    Take 1 tablet (5 mg total) by mouth at bedtime.   LOSARTAN (COZAAR) 50 MG TABLET    Take 1.5 tablets (75 mg total) by mouth daily. for blood pressure   MELATONIN 3 MG CAPS    Take 1 capsule (3 mg total) by mouth at bedtime and may repeat dose one time if needed.   METOPROLOL (LOPRESSOR) 50 MG TABLET    Take 1 tablet (50 mg total) by mouth 2 (two) times daily.   MULTIPLE VITAMIN (MULITIVITAMIN  WITH MINERALS) TABS    Take 1 tablet by mouth daily. 2 in am, 1 in pm   PHOSPHATIDYLSERINE-DHA-EPA 100-19.5-6.5 MG CAPS    Take 1 capsule by mouth daily.   SACCHAROMYCES BOULARDII (FLORASTOR) 250 MG CAPSULE    Take 1 capsule (250 mg total) by mouth 2 (two) times daily.  Modified Medications   No medications on file  Discontinued Medications   No medications on file    Physical Exam: Filed Vitals:   01/02/15 0849  BP: 120/66  Pulse: 73  Temp: 97.7 F (36.5 C)  TempSrc: Oral  Resp: 20  Height:  (1.651 m)  Weight: 114 lb 12.8 oz (52.073 kg)  SpO2: 96%   Physical Exam  Constitutional: She appears well-developed and well-nourished. No distress.    Pulmonary/Chest: Breath sounds normal.  Abdominal: Soft. Bowel sounds are normal. She exhibits no distension and no mass. There is no tenderness. There is no rebound and no guarding. No hernia.  Genitourinary: Vagina normal and uterus normal. Guaiac negative stool. No vaginal discharge found.  No vaginal bleeding, excoriation, bruising, or masses seen or felt  Musculoskeletal:  Walks with rollator walker  Neurological: She is alert.  Skin: Skin is warm and dry.  Psychiatric: She has a normal mood and affect.    Labs reviewed: Basic Metabolic Panel:  Recent Labs  69/62/95 1007 09/04/14 1011 09/08/14 1647 12/09/14 1132  NA 143 144 140 142  K 3.9 3.8 4.2 4.8  CL 110 92* 107 102  CO2 25  --  26 25  GLUCOSE 144* 145* 117* 95  BUN 26* 26* 18 23  CREATININE 0.84 0.70 0.85 0.94  CALCIUM 8.9  --  9.2 9.6   Liver Function Tests:  Recent Labs  06/27/14 1620 09/04/14 1007 12/09/14 1132  AST 22 32 16  ALT ALKPHOS 88 48 71  BILITOT 0.6 0.6 0.4  PROT 6.9 6.7 7.0  ALBUMIN  --  3.7  --     Recent Labs  06/27/14 1620  LIPASE 44  AMYLASE 89   No results for input(s): AMMONIA in the last 8760 hours. CBC:  Recent Labs  08/24/14 0940 09/04/14 1007 09/04/14 1011 09/08/14 1647 12/09/14 1132  WBC 7.6 10.4  --  7.2 8.0  NEUTROABS 3.7 7.3  --  3.6 3.9  HGB 12.2 12.7 13.3 12.1  --   HCT 36.6 39.0 39.0 37.7 36.8  MCV 90.6 94.4  --  95.0  --   PLT 194.0 214  --  209  --    Lipid Panel:  Recent Labs  01/11/14 1421 12/09/14 1132  CHOL 226* 224*  HDL 58 41  LDLCALC 137* 106*  TRIG 153* 385*  CHOLHDL 3.9 5.5*   Lab Results  Component Value Date   HGBA1C 5.6 07/29/2013    Assessment/Plan 1. Vaginal bleeding - pelvic exam with speculum exam and digital rectal exam all unremarkable -advised that if vaginal bleeding recurs, she will need to see a gynecologist for imaging and assessment of postmenopausal bleeding -will r/o acute blood loss anemia with  CBC - CBC with Differential/Platelet -instructions provided to [facility] on Dr.'s visit form  Labs/tests ordered:   Orders Placed This Encounter  Procedures  . CBC with Differential/Platelet   Next appt:  Keep regular visit with Dr. Arrie Eastern L. Shanda Cadotte, D.O. Geriatrics [facility] [address], [place] [address] Cell Phone (Mon-Fri 8am-5pm):  [phone] On  Call:  [phone] & follow prompts after 5pm & weekends Office Phone:  [phone] Office Fax:  [phone]

## 2015-01-03 LAB — CBC WITH DIFFERENTIAL/PLATELET
Basophils Absolute: 0 10*3/uL (ref 0.0–0.2)
Basos: 0 %
EOS (ABSOLUTE): 0.2 10*3/uL (ref 0.0–0.4)
Eos: 3 %
Hematocrit: 36.7 % (ref 34.0–46.6)
Hemoglobin: 11.9 g/dL (ref 11.1–15.9)
Immature Grans (Abs): 0 10*3/uL (ref 0.0–0.1)
Immature Granulocytes: 0 %
Lymphocytes Absolute: 2.3 10*3/uL (ref 0.7–3.1)
Lymphs: 33 %
MCH: 30.2 pg (ref 26.6–33.0)
MCHC: 32.4 g/dL (ref 31.5–35.7)
MCV: 93 fL (ref 79–97)
Monocytes Absolute: 0.8 10*3/uL (ref 0.1–0.9)
Monocytes: 11 %
Neutrophils Absolute: 3.6 10*3/uL (ref 1.4–7.0)
Neutrophils: 53 %
Platelets: 233 10*3/uL (ref 150–379)
RBC: 3.94 x10E6/uL (ref 3.77–5.28)
RDW: 13.9 % (ref 12.3–15.4)
WBC: 6.9 10*3/uL (ref 3.4–10.8)

## 2015-01-04 DIAGNOSIS — R260 Ataxic gait: Secondary | ICD-10-CM | POA: Diagnosis not present

## 2015-01-04 DIAGNOSIS — R41841 Cognitive communication deficit: Secondary | ICD-10-CM | POA: Diagnosis not present

## 2015-01-04 DIAGNOSIS — R278 Other lack of coordination: Secondary | ICD-10-CM | POA: Diagnosis not present

## 2015-01-04 DIAGNOSIS — Z9181 History of falling: Secondary | ICD-10-CM | POA: Diagnosis not present

## 2015-01-04 DIAGNOSIS — M6281 Muscle weakness (generalized): Secondary | ICD-10-CM | POA: Diagnosis not present

## 2015-01-05 DIAGNOSIS — R41841 Cognitive communication deficit: Secondary | ICD-10-CM | POA: Diagnosis not present

## 2015-01-05 DIAGNOSIS — Z9181 History of falling: Secondary | ICD-10-CM | POA: Diagnosis not present

## 2015-01-05 DIAGNOSIS — R260 Ataxic gait: Secondary | ICD-10-CM | POA: Diagnosis not present

## 2015-01-05 DIAGNOSIS — M6281 Muscle weakness (generalized): Secondary | ICD-10-CM | POA: Diagnosis not present

## 2015-01-05 DIAGNOSIS — R278 Other lack of coordination: Secondary | ICD-10-CM | POA: Diagnosis not present

## 2015-01-06 DIAGNOSIS — R260 Ataxic gait: Secondary | ICD-10-CM | POA: Diagnosis not present

## 2015-01-06 DIAGNOSIS — M6281 Muscle weakness (generalized): Secondary | ICD-10-CM | POA: Diagnosis not present

## 2015-01-06 DIAGNOSIS — R278 Other lack of coordination: Secondary | ICD-10-CM | POA: Diagnosis not present

## 2015-01-06 DIAGNOSIS — Z9181 History of falling: Secondary | ICD-10-CM | POA: Diagnosis not present

## 2015-01-06 DIAGNOSIS — R41841 Cognitive communication deficit: Secondary | ICD-10-CM | POA: Diagnosis not present

## 2015-01-10 DIAGNOSIS — R278 Other lack of coordination: Secondary | ICD-10-CM | POA: Diagnosis not present

## 2015-01-10 DIAGNOSIS — Z9181 History of falling: Secondary | ICD-10-CM | POA: Diagnosis not present

## 2015-01-10 DIAGNOSIS — R260 Ataxic gait: Secondary | ICD-10-CM | POA: Diagnosis not present

## 2015-01-10 DIAGNOSIS — R41841 Cognitive communication deficit: Secondary | ICD-10-CM | POA: Diagnosis not present

## 2015-01-10 DIAGNOSIS — M6281 Muscle weakness (generalized): Secondary | ICD-10-CM | POA: Diagnosis not present

## 2015-01-11 DIAGNOSIS — Z9181 History of falling: Secondary | ICD-10-CM | POA: Diagnosis not present

## 2015-01-11 DIAGNOSIS — R260 Ataxic gait: Secondary | ICD-10-CM | POA: Diagnosis not present

## 2015-01-11 DIAGNOSIS — M6281 Muscle weakness (generalized): Secondary | ICD-10-CM | POA: Diagnosis not present

## 2015-01-11 DIAGNOSIS — R278 Other lack of coordination: Secondary | ICD-10-CM | POA: Diagnosis not present

## 2015-01-11 DIAGNOSIS — R41841 Cognitive communication deficit: Secondary | ICD-10-CM | POA: Diagnosis not present

## 2015-01-12 DIAGNOSIS — R278 Other lack of coordination: Secondary | ICD-10-CM | POA: Diagnosis not present

## 2015-01-12 DIAGNOSIS — Z9181 History of falling: Secondary | ICD-10-CM | POA: Diagnosis not present

## 2015-01-12 DIAGNOSIS — R41841 Cognitive communication deficit: Secondary | ICD-10-CM | POA: Diagnosis not present

## 2015-01-12 DIAGNOSIS — M6281 Muscle weakness (generalized): Secondary | ICD-10-CM | POA: Diagnosis not present

## 2015-01-12 DIAGNOSIS — R260 Ataxic gait: Secondary | ICD-10-CM | POA: Diagnosis not present

## 2015-01-13 ENCOUNTER — Telehealth: Payer: Self-pay | Admitting: *Deleted

## 2015-01-13 ENCOUNTER — Ambulatory Visit: Payer: Medicare Other | Admitting: Internal Medicine

## 2015-01-13 NOTE — Telephone Encounter (Signed)
Spoke with patient's daughter she stated that they thought they did'nt need to come to this appt due to coming to see Dr. Renato Gails. She also stated that the her mother is feeling fine ,and she is not complaining of any pain now.

## 2015-01-17 DIAGNOSIS — M6281 Muscle weakness (generalized): Secondary | ICD-10-CM | POA: Diagnosis not present

## 2015-01-17 DIAGNOSIS — Z9181 History of falling: Secondary | ICD-10-CM | POA: Diagnosis not present

## 2015-01-17 DIAGNOSIS — R278 Other lack of coordination: Secondary | ICD-10-CM | POA: Diagnosis not present

## 2015-01-17 DIAGNOSIS — R260 Ataxic gait: Secondary | ICD-10-CM | POA: Diagnosis not present

## 2015-01-17 DIAGNOSIS — R41841 Cognitive communication deficit: Secondary | ICD-10-CM | POA: Diagnosis not present

## 2015-01-18 DIAGNOSIS — R260 Ataxic gait: Secondary | ICD-10-CM | POA: Diagnosis not present

## 2015-01-18 DIAGNOSIS — M6281 Muscle weakness (generalized): Secondary | ICD-10-CM | POA: Diagnosis not present

## 2015-01-18 DIAGNOSIS — Z9181 History of falling: Secondary | ICD-10-CM | POA: Diagnosis not present

## 2015-01-18 DIAGNOSIS — R278 Other lack of coordination: Secondary | ICD-10-CM | POA: Diagnosis not present

## 2015-01-18 DIAGNOSIS — R41841 Cognitive communication deficit: Secondary | ICD-10-CM | POA: Diagnosis not present

## 2015-01-19 DIAGNOSIS — R41841 Cognitive communication deficit: Secondary | ICD-10-CM | POA: Diagnosis not present

## 2015-01-19 DIAGNOSIS — Z9181 History of falling: Secondary | ICD-10-CM | POA: Diagnosis not present

## 2015-01-19 DIAGNOSIS — M6281 Muscle weakness (generalized): Secondary | ICD-10-CM | POA: Diagnosis not present

## 2015-01-19 DIAGNOSIS — R278 Other lack of coordination: Secondary | ICD-10-CM | POA: Diagnosis not present

## 2015-01-19 DIAGNOSIS — R260 Ataxic gait: Secondary | ICD-10-CM | POA: Diagnosis not present

## 2015-01-20 ENCOUNTER — Encounter: Payer: Self-pay | Admitting: Surgery

## 2015-01-23 ENCOUNTER — Ambulatory Visit (HOSPITAL_COMMUNITY)
Admission: RE | Admit: 2015-01-23 | Discharge: 2015-01-23 | Disposition: A | Discharge status: Home / Self Care | Payer: Medicare Other | Source: Ambulatory Visit | Attending: Surgery | Admitting: Surgery

## 2015-01-23 ENCOUNTER — Ambulatory Visit (INDEPENDENT_AMBULATORY_CARE_PROVIDER_SITE_OTHER): Payer: Medicare Other | Admitting: Surgery

## 2015-01-23 ENCOUNTER — Encounter: Payer: Self-pay | Admitting: Surgery

## 2015-01-23 VITALS — BP 128/58 | HR 50 | Temp 97.9°F | Resp 16 | Ht 60.0 in | Wt 117.0 lb

## 2015-01-23 DIAGNOSIS — M79605 Pain in left leg: Secondary | ICD-10-CM | POA: Insufficient documentation

## 2015-01-23 DIAGNOSIS — M7122 Synovial cyst of popliteal space [Baker], left knee: Secondary | ICD-10-CM

## 2015-01-23 NOTE — Progress Notes (Signed)
Filed Vitals:   01/23/15 1312 01/23/15 1319  BP: 196/70 128/58  Pulse: 65 50  Temp: 97.9 F (36.6 C)   TempSrc: Oral   Resp: 16   Height: 5' (1.524 m)   Weight: 117 lb (53.071 kg)   SpO2: 98%

## 2015-01-23 NOTE — Progress Notes (Signed)
HISTORY AND PHYSICAL   History of Present Illness:  Patient is a 81 y.o. year old female who presents for evaluation of left knee for possible popliteal aneurysm.  She has had a few falls and she was seen by her primary care physician to evaluate her.  The left knee was x rayed and showed possible bakers cyst verses aneurysm.  She has no knee pain, no ischemic changes in bilateral LE, no symptoms of claudication.   Other medical problems include has Hypertension; High cholesterol; Osteoporosis; Dementia; Other and unspecified hyperlipidemia; Vascular dementia; Hyperlipidemia with target LDL less than 130; Intertrochanteric fracture of left hip; Anemia; Hip fracture, left; Dementia, vascular, mixed; Left leg weakness; Insomnia; Vasculitis of skin; UTI (urinary tract infection); Right hip pain; and Frequent falls on her problem list.  Past Medical History  Diagnosis Date  . Hypertension   . High cholesterol   . Osteoporosis   . Type II or unspecified type diabetes mellitus without mention of complication, not stated as uncontrolled   . Hyperosmolality and/or hypernatremia   . Dysphagia, unspecified(787.20)   . Other atopic dermatitis and related conditions   . Encounter for long-term (current) use of other medications   . Cardiac catheterisation as the cause of abnormal reaction of patient, or of later complication 10/05/2006  . Vascular dementia 06/22/2013    Past Surgical History  Procedure Laterality Date  . Femur surgery Right 03/2010  . Shoulder surgery Left 03/2010  . Femur im nail  07/24/2011    Procedure: INTRAMEDULLARY (IM) NAIL FEMORAL;  Surgeon: Javier Docker, MD;  Location: [facility];  Service: Orthopedics;  Laterality: Right;  Biomet, c-arm, Jackson table  . Intramedullary (im) nail intertrochanteric Left 07/30/2013    Procedure: INTRAMEDULLARY (IM) NAIL INTERTROCHANTRIC LEFT;  Surgeon: Shelda Pal, MD;  Location: [facility];  Service: Orthopedics;  Laterality: Left;  . Fracture  surgery Left     Hip, Leg and shoulder    Social History Social History  Substance Use Topics  . Smoking status: Never Smoker   . Smokeless tobacco: Never Used  . Alcohol Use: No    Family History Family History  Problem Relation Age of Onset  . Cancer Sister   . Heart disease Father     Allergies  No Known Allergies   Current Outpatient Prescriptions  Medication Sig Dispense Refill  . atorvastatin (LIPITOR) 10 MG tablet Take 1 tablet (10 mg total) by mouth daily. 90 tablet 3  . Coenzyme Q10 (CO Q 10) 100 MG CAPS Take 1 tablet by mouth 2 (two) times daily.    . colestipol (COLESTID) 1 G tablet Take 1 tablet (1 g total) by mouth daily. 90 tablet 3  . diphenoxylate-atropine (LOMOTIL) 2.5-0.025 MG per tablet Take one tablet by mouth three times daily as needed for diarrhea (control) 90 tablet 0  . donepezil (ARICEPT) 5 MG tablet Take 1 tablet (5 mg total) by mouth at bedtime. 90 tablet 1  . losartan (COZAAR) 50 MG tablet Take 1.5 tablets (75 mg total) by mouth daily. for blood pressure 135 tablet 3  . Melatonin 3 MG CAPS Take 1 capsule (3 mg total) by mouth at bedtime and may repeat dose one time if needed. 45 capsule 3  . metoprolol (LOPRESSOR) 50 MG tablet Take 1 tablet (50 mg total) by mouth 2 (two) times daily. 180 tablet 3  . Multiple Vitamin (MULITIVITAMIN WITH MINERALS) TABS Take 1 tablet by mouth daily. 2 in am, 1 in pm    .  Phosphatidylserine-DHA-EPA 100-19.5-6.5 MG CAPS Take 1 capsule by mouth daily. 90 capsule 3  . saccharomyces boulardii (FLORASTOR) 250 MG capsule Take 1 capsule (250 mg total) by mouth 2 (two) times daily. 180 capsule 3   No current facility-administered medications for this visit.    ROS:   General:  No weight loss, Fever, chills  HEENT: No recent headaches, no nasal bleeding, no visual changes, no sore throat  Neurologic: No dizziness, blackouts, seizures. No recent symptoms of stroke or mini- stroke. No recent episodes of slurred speech, or  temporary blindness.  Cardiac: No recent episodes of chest pain/pressure, no shortness of breath at rest.  No shortness of breath with exertion.  Denies history of atrial fibrillation or irregular heartbeat  Vascular: No history of rest pain in feet.  No history of claudication.  No history of non-healing ulcer, No history of DVT   Pulmonary: No home oxygen, no productive cough, no hemoptysis,  No asthma or wheezing  Musculoskeletal:  [ x] Arthritis, [x ] Low back pain,   Joint pain  Hematologic:No history of hypercoagulable state.  No history of easy bleeding.  No history of anemia  Gastrointestinal: No hematochezia or melena,  No gastroesophageal reflux, no trouble swallowing  Urinary:  chronic Kidney disease,  on HD -  MWF or  TTHS,  Burning with urination,  Frequent urination,  Difficulty urinating;   Skin: No rashes  Psychological: No history of anxiety,  No history of depression   Physical Examination  Filed Vitals:   01/23/15 1312 01/23/15 1319  BP: 196/70 128/58  Pulse: 65 50  Temp: 97.9 F (36.6 C)   TempSrc: Oral   Resp: 16   Height: 5' (1.524 m)   Weight: 117 lb (53.071 kg)   SpO2: 98%     Body mass index is 22.85 kg/(m^2).  General:  Alert and oriented, no acute distress HEENT: Normal Neck: No bruit or JVD Pulmonary: Clear to auscultation bilaterally Cardiac: Regular Rate and Rhythm without murmur Abdomen: Soft, non-tender, non-distended, no mass, no scars Skin: No rash Extremity Pulses:  2+ radial, brachial, femoral, dorsalis pedis, posterior tibial pulses bilaterally Musculoskeletal: No deformity or edema, well healed right lateral thigh scar  Neurologic: Upper and lower extremity motor 5/5 and grossly symmetric  DATA:  bilteral popliteal arterial duplex 0.47 cm X 0.46cm popliteal arteries No evidence of aneurysm, soft tissue baker cyst 3.6 X 1.4 cm  ASSESSMENT:   Left knee baker's cyst Arteries are patent and normal  diameter bilaterally with palpable distal pulses.   PLAN:  Activity as tolerates She is not at risk of limb loss or aneurysm rupture She will follow up as needed in the future She may want to see an orthopedic if the Baker's cyst causes her problems later in time.  Thomasena Edis, EMMA MAUREEN PA-C [facility]   She was seen in conjunction with Dr. Myra Gianotti   I agree with the above.  I have seen and evaluated the patient.  Ultrasound findings today showed the area of concern which could possibly have been a popliteal aneurysm is actually a Baker's cyst.  No vascular treatment is needed for this.  Durene Cal

## 2015-01-24 DIAGNOSIS — M6281 Muscle weakness (generalized): Secondary | ICD-10-CM | POA: Diagnosis not present

## 2015-01-24 DIAGNOSIS — Z9181 History of falling: Secondary | ICD-10-CM | POA: Diagnosis not present

## 2015-01-24 DIAGNOSIS — R260 Ataxic gait: Secondary | ICD-10-CM | POA: Diagnosis not present

## 2015-01-24 DIAGNOSIS — R278 Other lack of coordination: Secondary | ICD-10-CM | POA: Diagnosis not present

## 2015-01-24 DIAGNOSIS — R41841 Cognitive communication deficit: Secondary | ICD-10-CM | POA: Diagnosis not present

## 2015-01-26 DIAGNOSIS — Z9181 History of falling: Secondary | ICD-10-CM | POA: Diagnosis not present

## 2015-01-26 DIAGNOSIS — M6281 Muscle weakness (generalized): Secondary | ICD-10-CM | POA: Diagnosis not present

## 2015-01-26 DIAGNOSIS — R278 Other lack of coordination: Secondary | ICD-10-CM | POA: Diagnosis not present

## 2015-01-26 DIAGNOSIS — R260 Ataxic gait: Secondary | ICD-10-CM | POA: Diagnosis not present

## 2015-01-26 DIAGNOSIS — R41841 Cognitive communication deficit: Secondary | ICD-10-CM | POA: Diagnosis not present

## 2015-01-31 DIAGNOSIS — R41841 Cognitive communication deficit: Secondary | ICD-10-CM | POA: Diagnosis not present

## 2015-01-31 DIAGNOSIS — M6281 Muscle weakness (generalized): Secondary | ICD-10-CM | POA: Diagnosis not present

## 2015-01-31 DIAGNOSIS — R278 Other lack of coordination: Secondary | ICD-10-CM | POA: Diagnosis not present

## 2015-01-31 DIAGNOSIS — Z9181 History of falling: Secondary | ICD-10-CM | POA: Diagnosis not present

## 2015-01-31 DIAGNOSIS — R260 Ataxic gait: Secondary | ICD-10-CM | POA: Diagnosis not present

## 2015-02-01 DIAGNOSIS — Z7689 Persons encountering health services in other specified circumstances: Secondary | ICD-10-CM | POA: Diagnosis not present

## 2015-02-02 DIAGNOSIS — R41841 Cognitive communication deficit: Secondary | ICD-10-CM | POA: Diagnosis not present

## 2015-02-02 DIAGNOSIS — Z9181 History of falling: Secondary | ICD-10-CM | POA: Diagnosis not present

## 2015-02-02 DIAGNOSIS — R260 Ataxic gait: Secondary | ICD-10-CM | POA: Diagnosis not present

## 2015-02-02 DIAGNOSIS — R278 Other lack of coordination: Secondary | ICD-10-CM | POA: Diagnosis not present

## 2015-02-02 DIAGNOSIS — M6281 Muscle weakness (generalized): Secondary | ICD-10-CM | POA: Diagnosis not present

## 2015-02-03 DIAGNOSIS — Z9181 History of falling: Secondary | ICD-10-CM | POA: Diagnosis not present

## 2015-02-03 DIAGNOSIS — R41841 Cognitive communication deficit: Secondary | ICD-10-CM | POA: Diagnosis not present

## 2015-02-03 DIAGNOSIS — R278 Other lack of coordination: Secondary | ICD-10-CM | POA: Diagnosis not present

## 2015-02-03 DIAGNOSIS — R260 Ataxic gait: Secondary | ICD-10-CM | POA: Diagnosis not present

## 2015-02-03 DIAGNOSIS — M6281 Muscle weakness (generalized): Secondary | ICD-10-CM | POA: Diagnosis not present

## 2015-02-03 IMAGING — RF DG HIP OPERATIVE*L*
1 series · 1 of 1 positions shown · non-contrast
Comparison: Left hip radiographs performed 07/29/2013

CLINICAL DATA: Left femoral intramedullary nail placement.

EXAM:
OPERATIVE LEFT HIP

[Series 1: run · 1 of 1 slices shown]
[im 1/1]
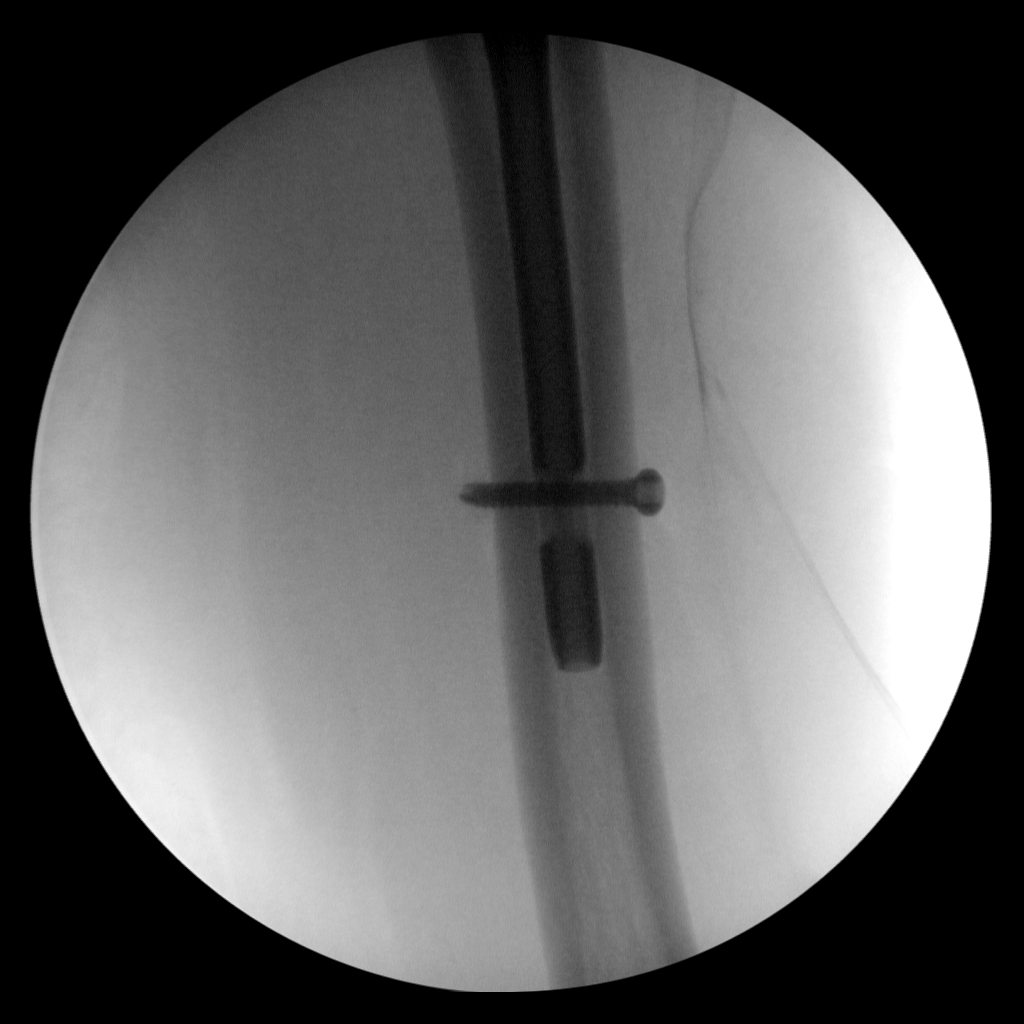

[1 of 1 positions shown; findings below may reference images not displayed]

FINDINGS: Three fluoroscopic C-arm images demonstrate successful placement of
an intramedullary nail within the proximal left femur, transfixing
the intertrochanteric fracture in grossly anatomic alignment. A
mildly displaced lesser trochanteric fragment is again seen.

The left femoral head remains seated at the acetabulum. No new
fractures are identified.
IMPRESSION: Successful internal fixation of left femoral intertrochanteric
fracture in grossly anatomic alignment. Mildly displaced lesser
trochanteric fragment again seen.

## 2015-02-06 ENCOUNTER — Other Ambulatory Visit: Payer: Self-pay | Admitting: *Deleted

## 2015-02-06 MED ORDER — AMOXICILLIN-POT CLAVULANATE 875-125 MG PO TABS: ORAL_TABLET | ORAL | Status: DC

## 2015-02-06 NOTE — Telephone Encounter (Signed)
Received faxed Urine Culture results from [facility] and shows infection. Per Dr. Salvatore Marvel 875/125mg  Take one tablet by mouth twice daily for 10 days for UTI. Yogurt daily. Faxed this order to [facility] #[phone] and also contacted daughter and faxed Rx to pharmacy.

## 2015-02-07 DIAGNOSIS — R278 Other lack of coordination: Secondary | ICD-10-CM | POA: Diagnosis not present

## 2015-02-07 DIAGNOSIS — R41841 Cognitive communication deficit: Secondary | ICD-10-CM | POA: Diagnosis not present

## 2015-02-07 DIAGNOSIS — Z9181 History of falling: Secondary | ICD-10-CM | POA: Diagnosis not present

## 2015-02-07 DIAGNOSIS — M6281 Muscle weakness (generalized): Secondary | ICD-10-CM | POA: Diagnosis not present

## 2015-02-07 DIAGNOSIS — R260 Ataxic gait: Secondary | ICD-10-CM | POA: Diagnosis not present

## 2015-02-09 DIAGNOSIS — Z9181 History of falling: Secondary | ICD-10-CM | POA: Diagnosis not present

## 2015-02-09 DIAGNOSIS — R278 Other lack of coordination: Secondary | ICD-10-CM | POA: Diagnosis not present

## 2015-02-09 DIAGNOSIS — M6281 Muscle weakness (generalized): Secondary | ICD-10-CM | POA: Diagnosis not present

## 2015-02-15 DIAGNOSIS — R278 Other lack of coordination: Secondary | ICD-10-CM | POA: Diagnosis not present

## 2015-02-15 DIAGNOSIS — M6281 Muscle weakness (generalized): Secondary | ICD-10-CM | POA: Diagnosis not present

## 2015-02-15 DIAGNOSIS — Z9181 History of falling: Secondary | ICD-10-CM | POA: Diagnosis not present

## 2015-02-17 ENCOUNTER — Telehealth: Payer: Self-pay | Admitting: *Deleted

## 2015-02-17 ENCOUNTER — Other Ambulatory Visit: Payer: Self-pay | Admitting: Internal Medicine

## 2015-02-17 DIAGNOSIS — Z9181 History of falling: Secondary | ICD-10-CM | POA: Diagnosis not present

## 2015-02-17 DIAGNOSIS — R278 Other lack of coordination: Secondary | ICD-10-CM | POA: Diagnosis not present

## 2015-02-17 DIAGNOSIS — M6281 Muscle weakness (generalized): Secondary | ICD-10-CM | POA: Diagnosis not present

## 2015-02-17 MED ORDER — HYDROCORTISONE 0.5 % EX OINT: TOPICAL_OINTMENT | CUTANEOUS | Status: DC

## 2015-02-17 NOTE — Telephone Encounter (Signed)
Daughter, Adda called and stated that patient needs an order faxed to [facility] Fax#: [phone] for Hydrocortisone cream to apply to her face for eczema. Please Advise Printed and given to Dr. Montez Morita to review and sign

## 2015-02-17 NOTE — Telephone Encounter (Signed)
Per Dr. Zada Girt 0.5% Ointment Apply to rash twice daily for 2 weeks, then use as needed. Faxed order to [facility]

## 2015-02-20 ENCOUNTER — Other Ambulatory Visit: Payer: Self-pay | Admitting: Internal Medicine

## 2015-02-21 DIAGNOSIS — M6281 Muscle weakness (generalized): Secondary | ICD-10-CM | POA: Diagnosis not present

## 2015-02-21 DIAGNOSIS — Z9181 History of falling: Secondary | ICD-10-CM | POA: Diagnosis not present

## 2015-02-21 DIAGNOSIS — R278 Other lack of coordination: Secondary | ICD-10-CM | POA: Diagnosis not present

## 2015-02-23 DIAGNOSIS — Z9181 History of falling: Secondary | ICD-10-CM | POA: Diagnosis not present

## 2015-02-23 DIAGNOSIS — R278 Other lack of coordination: Secondary | ICD-10-CM | POA: Diagnosis not present

## 2015-02-23 DIAGNOSIS — M6281 Muscle weakness (generalized): Secondary | ICD-10-CM | POA: Diagnosis not present

## 2015-02-28 DIAGNOSIS — R278 Other lack of coordination: Secondary | ICD-10-CM | POA: Diagnosis not present

## 2015-02-28 DIAGNOSIS — M6281 Muscle weakness (generalized): Secondary | ICD-10-CM | POA: Diagnosis not present

## 2015-02-28 DIAGNOSIS — Z9181 History of falling: Secondary | ICD-10-CM | POA: Diagnosis not present

## 2015-03-03 ENCOUNTER — Ambulatory Visit: Payer: Medicare Other | Admitting: Internal Medicine

## 2015-03-03 ENCOUNTER — Encounter: Payer: Self-pay | Admitting: Internal Medicine

## 2015-03-03 ENCOUNTER — Ambulatory Visit (INDEPENDENT_AMBULATORY_CARE_PROVIDER_SITE_OTHER): Payer: Medicare Other | Admitting: Internal Medicine

## 2015-03-03 ENCOUNTER — Telehealth: Payer: Self-pay

## 2015-03-03 VITALS — BP 132/88 | HR 61 | Temp 97.4°F | Resp 20 | Ht 60.0 in | Wt 117.4 lb

## 2015-03-03 DIAGNOSIS — G309 Alzheimer's disease, unspecified: Secondary | ICD-10-CM | POA: Diagnosis not present

## 2015-03-03 DIAGNOSIS — Z23 Encounter for immunization: Secondary | ICD-10-CM

## 2015-03-03 DIAGNOSIS — N390 Urinary tract infection, site not specified: Secondary | ICD-10-CM | POA: Diagnosis not present

## 2015-03-03 DIAGNOSIS — I10 Essential (primary) hypertension: Secondary | ICD-10-CM

## 2015-03-03 DIAGNOSIS — F05 Delirium due to known physiological condition: Secondary | ICD-10-CM

## 2015-03-03 DIAGNOSIS — F0280 Dementia in other diseases classified elsewhere without behavioral disturbance: Secondary | ICD-10-CM

## 2015-03-03 DIAGNOSIS — F0150 Vascular dementia without behavioral disturbance: Secondary | ICD-10-CM

## 2015-03-03 MED ORDER — DONEPEZIL HCL 5 MG PO TABS: 5.0000 mg | ORAL_TABLET | Freq: Every day | ORAL | Status: DC

## 2015-03-03 MED ORDER — HYDROCORTISONE 0.5 % EX CREA: 1.0000 "application " | TOPICAL_CREAM | Freq: Two times a day (BID) | CUTANEOUS | Status: DC

## 2015-03-03 MED ORDER — AMBULATORY NON FORMULARY MEDICATION: Status: DC

## 2015-03-03 NOTE — Telephone Encounter (Signed)
Rx was sent into [facility] for Aricept in error durning OV. Per Dr.Carter call and cancel. I called [facility] and cancelled rx.

## 2015-03-03 NOTE — Progress Notes (Signed)
Patient ID: Beverly Barnett, female   DOB: 04/01/1924, 81 y.o.   MRN: [id number]    Location:    [facility]   Place of Service:   OFFICE  Chief Complaint  Patient presents with  . Acute Visit    little disoriented, staring off to one side when walking    HPI:  81 yo female seen today for increased confusion. Daughter reports that she was attempting to wipe herself with her pants pulled up last weekend. Pt states that staff at [facility] "keep moving me around". Staff reports she goes into other resident's rooms. She completed 10 days of Augmentin tx for UTI earlier this month. Pt reports no change in urinary habits. No bloody urine. No vaginal bleeding  She has dementia and is a poor historian. Hx obtained from daughter  Past Medical History  Diagnosis Date  . Hypertension   . High cholesterol   . Osteoporosis   . Type II or unspecified type diabetes mellitus without mention of complication, not stated as uncontrolled   . Hyperosmolality and/or hypernatremia   . Dysphagia, unspecified(787.20)   . Other atopic dermatitis and related conditions   . Encounter for long-term (current) use of other medications   . Cardiac catheterisation as the cause of abnormal reaction of patient, or of later complication 10/05/2006  . Vascular dementia 06/22/2013    Past Surgical History  Procedure Laterality Date  . Femur surgery Right 03/2010  . Shoulder surgery Left 03/2010  . Femur im nail  07/24/2011    Procedure: INTRAMEDULLARY (IM) NAIL FEMORAL;  Surgeon: Javier Docker, MD;  Location: [facility];  Service: Orthopedics;  Laterality: Right;  Biomet, c-arm, Jackson table  . Intramedullary (im) nail intertrochanteric Left 07/30/2013    Procedure: INTRAMEDULLARY (IM) NAIL INTERTROCHANTRIC LEFT;  Surgeon: Shelda Pal, MD;  Location: [facility];  Service: Orthopedics;  Laterality: Left;  . Fracture surgery Left     Hip, Leg and shoulder    Patient Care Team: [name], DO as PCP - General (Internal  Medicine)  Social History   Social History  . Marital Status: Widowed    Spouse Name: N/A  . Number of Children: N/A  . Years of Education: N/A   Occupational History  . Not on file.   Social History Main Topics  . Smoking status: Never Smoker   . Smokeless tobacco: Never Used  . Alcohol Use: No  . Drug Use: No  . Sexual Activity: Not on file   Other Topics Concern  . Not on file   Social History Narrative     reports that she has never smoked. She has never used smokeless tobacco. She reports that she does not drink alcohol or use illicit drugs.  No Known Allergies  Medications: Patient's Medications  New Prescriptions   HYDROCORTISONE CREAM 0.5 %    Apply 1 application topically 2 (two) times daily.  Previous Medications   ATORVASTATIN (LIPITOR) 10 MG TABLET    Take 1 tablet (10 mg total) by mouth daily.   COENZYME Q10 (CO Q 10) 100 MG CAPS    Take 1 tablet by mouth 2 (two) times daily.   COLESTIPOL (COLESTID) 1 G TABLET    Take 1 tablet (1 g total) by mouth daily.   DIPHENOXYLATE-ATROPINE (LOMOTIL) 2.5-0.025 MG PER TABLET    Take one tablet by mouth three times daily as needed for diarrhea (control)   DONEPEZIL (ARICEPT) 5 MG TABLET    Take 1 tablet (5 mg total)  by mouth at bedtime.   HYDROCORTISONE OINTMENT 0.5 %    Apply to rash twice daily for 2 weeks, then use as needed   LOSARTAN (COZAAR) 50 MG TABLET    Take 1.5 tablets (75 mg total) by mouth daily. for blood pressure   MELATONIN 3 MG CAPS    Take 1 capsule (3 mg total) by mouth at bedtime and may repeat dose one time if needed.   METOPROLOL (LOPRESSOR) 50 MG TABLET    Take 1 tablet (50 mg total) by mouth 2 (two) times daily.   MULTIPLE VITAMIN (MULITIVITAMIN WITH MINERALS) TABS    Take 1 tablet by mouth daily. 2 in am, 1 in pm   SACCHAROMYCES BOULARDII (FLORASTOR) 250 MG CAPSULE    Take 1 capsule (250 mg total) by mouth 2 (two) times daily.   VAYACOG 100-19.5-6.5 MG CAPS    TAKE (1) CAPSULE DAILY.  Modified  Medications   No medications on file  Discontinued Medications   AMOXICILLIN-CLAVULANATE (AUGMENTIN) 875-125 MG PER TABLET    Take one tablet by mouth twice daily for 10 days for UTI    Review of Systems  Unable to perform ROS: Dementia    Filed Vitals:   03/03/15 1141  BP: 132/88  Pulse: 61  Temp: 97.4 F (36.3 C)  TempSrc: Oral  Resp: 20  Height: 5' (1.524 m)  Weight: 117 lb 6.4 oz (53.252 kg)  SpO2: 98%   Body mass index is 22.93 kg/(m^2).  Physical Exam  Constitutional: She appears well-developed and well-nourished.  HENT:  Mouth/Throat: Oropharynx is clear and moist. No oropharyngeal exudate.  Eyes: Pupils are equal, round, and reactive to light. No scleral icterus.  Neck: Neck supple. Carotid bruit is not present. No tracheal deviation present. No thyromegaly present.  Cardiovascular: Normal rate, regular rhythm, normal heart sounds and intact distal pulses.  Exam reveals no gallop and no friction rub.   No murmur heard. No LE edema b/l. no calf TTP.   Pulmonary/Chest: Effort normal and breath sounds normal. No stridor. No respiratory distress. She has no wheezes. She has no rales.  Abdominal: Soft. Bowel sounds are normal. She exhibits no distension and no mass. There is no hepatomegaly. There is no tenderness. There is no rebound and no guarding.  No suprapubic TTP  Lymphadenopathy:    She has no cervical adenopathy.  Neurological: She is alert. She has normal strength. She is disoriented. She displays no tremor. No cranial nerve deficit. She displays no seizure activity. Gait abnormal.  Skin: Skin is warm and dry. No rash noted.  Psychiatric: She has a normal mood and affect. Her behavior is normal.     Labs reviewed: Office Visit on 01/02/2015  Component Date Value Ref Range Status  . WBC 01/02/2015 6.9  3.4 - 10.8 x10E3/uL Final  . RBC 01/02/2015 3.94  3.77 - 5.28 x10E6/uL Final  . Hemoglobin 01/02/2015 11.9  11.1 - 15.9 g/dL Final  . Hematocrit 16/03/9603  36.7  34.0 - 46.6 % Final  . MCV 01/02/2015 93  79 - 97 fL Final  . MCH 01/02/2015 30.2  26.6 - 33.0 pg Final  . MCHC 01/02/2015 32.4  31.5 - 35.7 g/dL Final  . RDW 54/02/8118 13.9  12.3 - 15.4 % Final  . Platelets 01/02/2015 233  150 - 379 x10E3/uL Final  . Neutrophils 01/02/2015 53   Final  . Lymphs 01/02/2015 33   Final  . Monocytes 01/02/2015 11   Final  . Eos 01/02/2015 3  Final  . Basos 01/02/2015 0   Final  . Neutrophils Absolute 01/02/2015 3.6  1.4 - 7.0 x10E3/uL Final  . Lymphocytes Absolute 01/02/2015 2.3  0.7 - 3.1 x10E3/uL Final  . Monocytes Absolute 01/02/2015 0.8  0.1 - 0.9 x10E3/uL Final  . EOS (ABSOLUTE) 01/02/2015 0.2  0.0 - 0.4 x10E3/uL Final  . Basophils Absolute 01/02/2015 0.0  0.0 - 0.2 x10E3/uL Final  . Immature Granulocytes 01/02/2015 0   Final  . Immature Grans (Abs) 01/02/2015 0.0  0.0 - 0.1 x10E3/uL Final  Office Visit on 12/09/2014  Component Date Value Ref Range Status  . WBC 12/09/2014 8.0  3.4 - 10.8 x10E3/uL Final  . RBC 12/09/2014 4.03  3.77 - 5.28 x10E6/uL Final  . Hemoglobin 12/09/2014 12.4  11.1 - 15.9 g/dL Final  . Hematocrit 16/03/9603 36.8  34.0 - 46.6 % Final  . MCV 12/09/2014 91  79 - 97 fL Final  . MCH 12/09/2014 30.8  26.6 - 33.0 pg Final  . MCHC 12/09/2014 33.7  31.5 - 35.7 g/dL Final  . RDW 54/02/8118 13.9  12.3 - 15.4 % Final  . Platelets 12/09/2014 246  150 - 379 x10E3/uL Final  . Neutrophils 12/09/2014 47   Final  . Lymphs 12/09/2014 37   Final  . Monocytes 12/09/2014 12   Final  . Eos 12/09/2014 3   Final  . Basos 12/09/2014 1   Final  . Neutrophils Absolute 12/09/2014 3.9  1.4 - 7.0 x10E3/uL Final  . Lymphocytes Absolute 12/09/2014 2.9  0.7 - 3.1 x10E3/uL Final  . Monocytes Absolute 12/09/2014 0.9  0.1 - 0.9 x10E3/uL Final  . EOS (ABSOLUTE) 12/09/2014 0.2  0.0 - 0.4 x10E3/uL Final  . Basophils Absolute 12/09/2014 0.0  0.0 - 0.2 x10E3/uL Final  . Immature Granulocytes 12/09/2014 0   Final  . Immature Grans (Abs) 12/09/2014 0.0   0.0 - 0.1 x10E3/uL Final  . Glucose 12/09/2014 95  65 - 99 mg/dL Final  . BUN 14/78/2956 23  8 - 27 mg/dL Final  . Creatinine, Ser 12/09/2014 0.94  0.57 - 1.00 mg/dL Final  . GFR calc non Af Amer 12/09/2014 54* >59 mL/min/1.73 Final  . GFR calc Af Amer 12/09/2014 63  >59 mL/min/1.73 Final  . BUN/Creatinine Ratio 12/09/2014 24  11 - 26 Final  . Sodium 12/09/2014 142  134 - 144 mmol/L Final  . Potassium 12/09/2014 4.8  3.5 - 5.2 mmol/L Final  . Chloride 12/09/2014 102  97 - 108 mmol/L Final  . CO2 12/09/2014 25  18 - 29 mmol/L Final  . Calcium 12/09/2014 9.6  8.7 - 10.3 mg/dL Final  . Total Protein 12/09/2014 7.0  6.0 - 8.5 g/dL Final  . Albumin 21/30/8657 4.0  3.5 - 4.7 g/dL Final  . Globulin, Total 12/09/2014 3.0  1.5 - 4.5 g/dL Final  . Albumin/Globulin Ratio 12/09/2014 1.3  1.1 - 2.5 Final  . Bilirubin Total 12/09/2014 0.4  0.0 - 1.2 mg/dL Final  . Alkaline Phosphatase 12/09/2014 71  39 - 117 IU/L Final  . AST 12/09/2014 16  0 - 40 IU/L Final  . ALT 12/09/2014 12  0 - 32 IU/L Final  . Cholesterol, Total 12/09/2014 224* 100 - 199 mg/dL Final  . Triglycerides 12/09/2014 385* 0 - 149 mg/dL Final  . HDL 84/69/6295 41  >39 mg/dL Final   Comment: According to ATP-III Guidelines, HDL-C >59 mg/dL is considered a negative risk factor for CHD.   Marland Kitchen VLDL Cholesterol Cal 12/09/2014 77* 5 - 40  mg/dL Final  . LDL Calculated 12/09/2014 161* 0 - 99 mg/dL Final  . Chol/HDL Ratio 12/09/2014 5.5* 0.0 - 4.4 ratio units Final   Comment:                                   T. Chol/HDL Ratio                                             Men  Women                               1/2 Avg.Risk  3.4    3.3                                   Avg.Risk  5.0    4.4                                2X Avg.Risk  9.6    7.1                                3X Avg.Risk 23.4   11.0     No results found.   Assessment/Plan   ICD-9-CM ICD-10-CM   1. Acute confusional state 293.0 F05   2. 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 Encounter for immunization Z23 Z23   4. Essential hypertension -stable 401.9 I10     Obtain UA with reflex C&S and fax results to office when available  Resume aricept at bedtime  Continue other medications as ordered  Follow up in 1 month for dementia  Monica S. Ancil Linsey  [facility] [address] [place], [place] [address] [phone] Cell (Monday-Friday 8 AM - 5 PM) [phone] After 5 PM and follow prompts

## 2015-03-03 NOTE — Patient Instructions (Signed)
Obtain UA with reflex C&S and fax results to office when available  Resume aricept at bedtime  Continue other medications as ordered  Follow up in 1 month for dementia

## 2015-03-06 ENCOUNTER — Telehealth: Payer: Self-pay | Admitting: *Deleted

## 2015-03-06 MED ORDER — CIPROFLOXACIN HCL 250 MG PO TABS: ORAL_TABLET | ORAL | Status: DC

## 2015-03-06 NOTE — Telephone Encounter (Signed)
Received Urine Culture Results from 03/06/2015 and Positive for UTI Per Dr. Kerrie Pleasure Cipro  by mouth twice daily for 7 days. Push fluids (at least 6-8 8oz glasses water per day) Eat yogurt daily while on antibiotic.   Faxed Rx and labs to [facility]: [phone] per N.Earlene Plater [employer]

## 2015-03-17 ENCOUNTER — Ambulatory Visit: Payer: Medicare Other | Admitting: Internal Medicine

## 2015-03-25 ENCOUNTER — Encounter (HOSPITAL_COMMUNITY): Payer: Self-pay | Admitting: Emergency Medicine

## 2015-03-25 ENCOUNTER — Emergency Department (HOSPITAL_COMMUNITY)
Admission: EM | Admit: 2015-03-25 | Discharge: 2015-03-25 | Disposition: A | Discharge status: Home / Self Care | Payer: Medicare Other | Attending: Emergency Medicine | Admitting: Emergency Medicine

## 2015-03-25 DIAGNOSIS — Z8739 Personal history of other diseases of the musculoskeletal system and connective tissue: Secondary | ICD-10-CM | POA: Insufficient documentation

## 2015-03-25 DIAGNOSIS — R259 Unspecified abnormal involuntary movements: Secondary | ICD-10-CM | POA: Diagnosis not present

## 2015-03-25 DIAGNOSIS — W19XXXA Unspecified fall, initial encounter: Secondary | ICD-10-CM

## 2015-03-25 DIAGNOSIS — Z7952 Long term (current) use of systemic steroids: Secondary | ICD-10-CM | POA: Diagnosis not present

## 2015-03-25 DIAGNOSIS — Z9889 Other specified postprocedural states: Secondary | ICD-10-CM | POA: Diagnosis not present

## 2015-03-25 DIAGNOSIS — Z872 Personal history of diseases of the skin and subcutaneous tissue: Secondary | ICD-10-CM | POA: Diagnosis not present

## 2015-03-25 DIAGNOSIS — Z79899 Other long term (current) drug therapy: Secondary | ICD-10-CM | POA: Insufficient documentation

## 2015-03-25 DIAGNOSIS — E7800 Pure hypercholesterolemia, unspecified: Secondary | ICD-10-CM | POA: Diagnosis not present

## 2015-03-25 DIAGNOSIS — Y9389 Activity, other specified: Secondary | ICD-10-CM | POA: Insufficient documentation

## 2015-03-25 DIAGNOSIS — Y998 Other external cause status: Secondary | ICD-10-CM | POA: Diagnosis not present

## 2015-03-25 DIAGNOSIS — W1830XA Fall on same level, unspecified, initial encounter: Secondary | ICD-10-CM | POA: Diagnosis not present

## 2015-03-25 DIAGNOSIS — Z043 Encounter for examination and observation following other accident: Secondary | ICD-10-CM | POA: Insufficient documentation

## 2015-03-25 DIAGNOSIS — M545 Low back pain: Secondary | ICD-10-CM | POA: Diagnosis not present

## 2015-03-25 DIAGNOSIS — Y9289 Other specified places as the place of occurrence of the external cause: Secondary | ICD-10-CM | POA: Insufficient documentation

## 2015-03-25 DIAGNOSIS — I10 Essential (primary) hypertension: Secondary | ICD-10-CM | POA: Diagnosis not present

## 2015-03-25 DIAGNOSIS — F0150 Vascular dementia without behavioral disturbance: Secondary | ICD-10-CM | POA: Insufficient documentation

## 2015-03-25 DIAGNOSIS — R6889 Other general symptoms and signs: Secondary | ICD-10-CM | POA: Diagnosis not present

## 2015-03-25 DIAGNOSIS — E119 Type 2 diabetes mellitus without complications: Secondary | ICD-10-CM | POA: Insufficient documentation

## 2015-03-25 NOTE — ED Notes (Signed)
[facility] to pick up patient. All paperwork sent with patient. Attempted calling patient's next of kin for transfer and attempted to call [facility] for report. No answer from either. No other c/c.

## 2015-03-25 NOTE — ED Notes (Signed)
Per EMS[facility]. Reports falling today-did not hit head or lose consciousness. C/o lower back pain. No tenderness or pain on movement. Pt thinks she "turned wrong." Reports turning wrong and losing her balance. A&O to her baseline. VSS.

## 2015-03-25 NOTE — ED Provider Notes (Signed)
CSN: [id number]     Arrival date & time 03/25/15  1910 History   First MD Initiated Contact with Patient 03/25/15 1918     Chief Complaint  Patient presents with  . Fall     (Consider location/radiation/quality/duration/timing/severity/associated sxs/prior Treatment) Patient is a 81 y.o. female presenting with fall.  Fall The current episode started today. Pertinent negatives include no abdominal pain, chest pain, chills, coughing, fever, headaches, nausea, neck pain, numbness, urinary symptoms or weakness.   Beverly Barnett is a 81 y.o. female with PMH significant for osteoporosis, HTN (no blood thinners) who presents with fall.  Patient states that she was standing and turned wrong causing her to lose her balance and fall.  She states she did not hit her head or LOC.  She has no complaints at this time.  Denies HA, visual disturbances, neck pain, CP, SOB, abdominal pain, N/V/D, urinary symptoms, or back pain.  Past Medical History  Diagnosis Date  . Hypertension   . High cholesterol   . Osteoporosis   . Type II or unspecified type diabetes mellitus without mention of complication, not stated as uncontrolled   . Hyperosmolality and/or hypernatremia   . Dysphagia, unspecified(787.20)   . Other atopic dermatitis and related conditions   . Encounter for long-term (current) use of other medications   . Cardiac catheterisation as the cause of abnormal reaction of patient, or of later complication 10/05/2006  . Vascular dementia 06/22/2013   Past Surgical History  Procedure Laterality Date  . Femur surgery Right 03/2010  . Shoulder surgery Left 03/2010  . Femur im nail  07/24/2011    Procedure: INTRAMEDULLARY (IM) NAIL FEMORAL;  Surgeon: Javier Docker, MD;  Location: [facility];  Service: Orthopedics;  Laterality: Right;  Biomet, c-arm, Jackson table  . Intramedullary (im) nail intertrochanteric Left 07/30/2013    Procedure: INTRAMEDULLARY (IM) NAIL INTERTROCHANTRIC LEFT;  Surgeon: Shelda Pal, MD;  Location: [facility];  Service: Orthopedics;  Laterality: Left;  . Fracture surgery Left     Hip, Leg and shoulder   Family History  Problem Relation Age of Onset  . Cancer Sister   . Heart disease Father    Social History  Substance Use Topics  . Smoking status: Never Smoker   . Smokeless tobacco: Never Used  . Alcohol Use: No   OB History    No data available     Review of Systems  Constitutional: Negative for fever and chills.  Eyes: Negative for visual disturbance.  Respiratory: Negative for cough.   Cardiovascular: Negative for chest pain.  Gastrointestinal: Negative for nausea and abdominal pain.  Genitourinary: Negative.   Musculoskeletal: Negative for back pain and neck pain.  Neurological: Negative for weakness, numbness and headaches.      Allergies  Review of patient's allergies indicates no known allergies.  Home Medications   Prior to Admission medications   Medication Sig Start Date End Date Taking? Authorizing Provider  AMBULATORY NON FORMULARY MEDICATION Orders- Urinalysis if abnormal C+S  Diagnosis- Acute confusional state F05 03/03/15   [name], DO  atorvastatin (LIPITOR) 10 MG tablet Take 1 tablet (10 mg total) by mouth daily. 10/07/14   Tiffany L Reed, DO  ciprofloxacin (CIPRO) 250 MG tablet Take one tablet by mouth twice daily for 7 days for infection 03/06/15   Tiffany L Reed, DO  Coenzyme Q10 (CO Q 10) 100 MG CAPS Take 1 tablet by mouth 2 (two) times daily.    Historical Provider, MD  colestipol (COLESTID) 1 G tablet Take 1 tablet (1 g total) by mouth daily. 10/07/14   Tiffany L Reed, DO  diphenoxylate-atropine (LOMOTIL) 2.5-0.025 MG per tablet Take one tablet by mouth three times daily as needed for diarrhea (control) 12/20/14   Sharon SellerJessica K Eubanks, NP  donepezil (ARICEPT) 5 MG tablet Take 1 tablet (5 mg total) by mouth at bedtime. 03/03/15   Kirt BoysMonica Carter, DO  hydrocortisone cream 0.5 % Apply 1 application topically 2 (two) times daily.  03/03/15   Kirt BoysMonica Carter, DO  hydrocortisone ointment 0.5 % Apply to rash twice daily for 2 weeks, then use as needed 02/17/15   Kirt BoysMonica Carter, DO  losartan (COZAAR) 50 MG tablet Take 1.5 tablets (75 mg total) by mouth daily. for blood pressure 10/07/14   Tiffany L Reed, DO  Melatonin 3 MG CAPS Take 1 capsule (3 mg total) by mouth at bedtime and may repeat dose one time if needed. 10/07/14   Tiffany L Reed, DO  metoprolol (LOPRESSOR) 50 MG tablet Take 1 tablet (50 mg total) by mouth 2 (two) times daily. 10/07/14   Tiffany L Reed, DO  Multiple Vitamin (MULITIVITAMIN WITH MINERALS) TABS Take 1 tablet by mouth daily. 2 in am, 1 in pm    Historical Provider, MD  saccharomyces boulardii (FLORASTOR) 250 MG capsule Take 1 capsule (250 mg total) by mouth 2 (two) times daily. 10/07/14   Tiffany L Reed, DO  VAYACOG 100-19.5-6.5 MG CAPS TAKE (1) CAPSULE DAILY. 02/20/15   Kimber RelicArthur G Green, MD   BP 133/42 mmHg  Pulse 68  Temp(Src) 98.1 F (36.7 C) (Oral)  Resp 18  SpO2 95% Physical Exam  Constitutional: She is oriented to person, place, and time. She appears well-developed and well-nourished.  HENT:  Head: Normocephalic and atraumatic.  Mouth/Throat: Oropharynx is clear and moist.  Eyes: Conjunctivae are normal. Pupils are equal, round, and reactive to light.  Neck: Normal range of motion. Neck supple.  Cardiovascular: Normal rate, regular rhythm and normal heart sounds.   No murmur heard. Pulmonary/Chest: Effort normal and breath sounds normal. No accessory muscle usage or stridor. No respiratory distress. She has no wheezes. She has no rhonchi. She has no rales.  Abdominal: Soft. Bowel sounds are normal. She exhibits no distension. There is no tenderness.  Musculoskeletal: Normal range of motion.  Lymphadenopathy:    She has no cervical adenopathy.  Neurological: She is alert and oriented to person, place, and time.  Speech clear without dysarthria.  Cranial nerves grossly intact.  Sensation and strength  intact bilaterally throughout.   Skin: Skin is warm and dry.  No signs of trauma.  Psychiatric: She has a normal mood and affect. Her behavior is normal.    ED Course  Procedures (including critical care time) Labs Review Labs Reviewed - No data to display  Imaging Review No results found. I have personally reviewed and evaluated these images and lab results as part of my medical decision-making.   EKG Interpretation None      MDM   Final diagnoses:  Fall, initial encounter    Patient here after fall.  No complaints at this time.  Denies fevers, chills, N/V/D, or urinary symptoms.  VSS, NAD.  On exam, no signs of trauma.  Lumbar spine is nontender.  Imaging not indicated at this time.  Suspect mechanical fall.  Patient stable for discharge.  Patient agrees with the above plan.  Follow up PCP as needed.   Case has been discussed with Dr. Freida BusmanAllen who  agrees with the above plan for discharge.    Cheri Fowler, PA-C 03/25/15 [phone]

## 2015-03-25 NOTE — Discharge Instructions (Signed)
Fall Prevention in the Home   Falls can cause injuries. They can happen to people of all ages. There are many things you can do to make your home safe and to help prevent falls.   WHAT CAN I DO ON THE OUTSIDE OF MY HOME?  · Regularly fix the edges of walkways and driveways and fix any cracks.  · Remove anything that might make you trip as you walk through a door, such as a raised step or threshold.  · Trim any bushes or trees on the path to your home.  · Use bright outdoor lighting.  · Clear any walking paths of anything that might make someone trip, such as rocks or tools.  · Regularly check to see if handrails are loose or broken. Make sure that both sides of any steps have handrails.  · Any raised decks and porches should have guardrails on the edges.  · Have any leaves, snow, or ice cleared regularly.  · Use sand or salt on walking paths during winter.  · Clean up any spills in your garage right away. This includes oil or grease spills.  WHAT CAN I DO IN THE BATHROOM?   · Use night lights.  · Install grab bars by the toilet and in the tub and shower. Do not use towel bars as grab bars.  · Use non-skid mats or decals in the tub or shower.  · If you need to sit down in the shower, use a plastic, non-slip stool.  · Keep the floor dry. Clean up any water that spills on the floor as soon as it happens.  · Remove soap buildup in the tub or shower regularly.  · Attach bath mats securely with double-sided non-slip rug tape.  · Do not have throw rugs and other things on the floor that can make you trip.  WHAT CAN I DO IN THE BEDROOM?  · Use night lights.  · Make sure that you have a light by your bed that is easy to reach.  · Do not use any sheets or blankets that are too big for your bed. They should not hang down onto the floor.  · Have a firm chair that has side arms. You can use this for support while you get dressed.  · Do not have throw rugs and other things on the floor that can make you trip.  WHAT CAN I DO IN  THE KITCHEN?  · Clean up any spills right away.  · Avoid walking on wet floors.  · Keep items that you use a lot in easy-to-reach places.  · If you need to reach something above you, use a strong step stool that has a grab bar.  · Keep electrical cords out of the way.  · Do not use floor polish or wax that makes floors slippery. If you must use wax, use non-skid floor wax.  · Do not have throw rugs and other things on the floor that can make you trip.  WHAT CAN I DO WITH MY STAIRS?  · Do not leave any items on the stairs.  · Make sure that there are handrails on both sides of the stairs and use them. Fix handrails that are broken or loose. Make sure that handrails are as long as the stairways.  · Check any carpeting to make sure that it is firmly attached to the stairs. Fix any carpet that is loose or worn.  · Avoid having throw rugs at the top   or bottom of the stairs. If you do have throw rugs, attach them to the floor with carpet tape.  · Make sure that you have a light switch at the top of the stairs and the bottom of the stairs. If you do not have them, ask someone to add them for you.  WHAT ELSE CAN I DO TO HELP PREVENT FALLS?  · Wear shoes that:    Do not have high heels.    Have rubber bottoms.    Are comfortable and fit you well.    Are closed at the toe. Do not wear sandals.  · If you use a stepladder:    Make sure that it is fully opened. Do not climb a closed stepladder.    Make sure that both sides of the stepladder are locked into place.    Ask someone to hold it for you, if possible.  · Clearly mark and make sure that you can see:    Any grab bars or handrails.    First and last steps.    Where the edge of each step is.  · Use tools that help you move around (mobility aids) if they are needed. These include:    Canes.    Walkers.    Scooters.    Crutches.  · Turn on the lights when you go into a dark area. Replace any light bulbs as soon as they burn out.  · Set up your furniture so you have a clear  path. Avoid moving your furniture around.  · If any of your floors are uneven, fix them.  · If there are any pets around you, be aware of where they are.  · Review your medicines with your doctor. Some medicines can make you feel dizzy. This can increase your chance of falling.  Ask your doctor what other things that you can do to help prevent falls.     This information is not intended to replace advice given to you by your health care provider. Make sure you discuss any questions you have with your health care provider.     Document Released: 03/23/2009 Document Revised: 10/11/2014 Document Reviewed: 07/01/2014  Elsevier Interactive Patient Education ©2016 [employer]

## 2015-03-25 NOTE — ED Notes (Signed)
Bed: [id number] Expected date: 03/25/15 Expected time: 7:08 PM Means of arrival: Ambulance Comments: Back Pain

## 2015-03-25 NOTE — ED Provider Notes (Signed)
Medical screening examination/treatment/procedure(s) were conducted as a shared visit with non-physician practitioner(s) and myself.  I personally evaluated the patient during the encounter.   EKG Interpretation None     Patient here after having a mechanical fall prior to arrival. In any symptoms prior to the incident. States that she has no pain at this time. Denies any head trauma. No chest or abdominal discomfort. The recent medications changes. Denies any urinary symptoms. On physical examination, patient is nontender along her spine. She has no pain on range of motion of her lower legs. Nontender along her ribs. Patient stable for discharge  Lorre NickAnthony Jenavi Beedle, MD 03/25/15 [phone]

## 2015-04-07 ENCOUNTER — Encounter: Payer: Self-pay | Admitting: Internal Medicine

## 2015-04-07 ENCOUNTER — Ambulatory Visit (INDEPENDENT_AMBULATORY_CARE_PROVIDER_SITE_OTHER): Payer: Medicare Other | Admitting: Internal Medicine

## 2015-04-07 VITALS — BP 140/88 | HR 64 | Temp 97.8°F | Resp 20 | Ht 60.0 in | Wt 116.6 lb

## 2015-04-07 DIAGNOSIS — R2681 Unsteadiness on feet: Secondary | ICD-10-CM

## 2015-04-07 DIAGNOSIS — G309 Alzheimer's disease, unspecified: Secondary | ICD-10-CM

## 2015-04-07 DIAGNOSIS — M25559 Pain in unspecified hip: Secondary | ICD-10-CM | POA: Diagnosis not present

## 2015-04-07 DIAGNOSIS — R296 Repeated falls: Secondary | ICD-10-CM

## 2015-04-07 DIAGNOSIS — I10 Essential (primary) hypertension: Secondary | ICD-10-CM

## 2015-04-07 DIAGNOSIS — F0280 Dementia in other diseases classified elsewhere without behavioral disturbance: Secondary | ICD-10-CM

## 2015-04-07 DIAGNOSIS — F0150 Vascular dementia without behavioral disturbance: Secondary | ICD-10-CM | POA: Diagnosis not present

## 2015-04-07 NOTE — Patient Instructions (Signed)
Will refer to PT for gait training in light of fixation of both hips due past fractures.  Increase aricept to 10mg  at bedtime  Continue other medications as ordered  Check blood pressure 2 times daily and record. Call office if >160/90  Follow up in 1 month for dementia and blood pressure

## 2015-04-07 NOTE — Progress Notes (Signed)
Patient ID: Beverly Barnett, female   DOB: 09-15-1923, 81 y.o.   MRN: [id number]    Location:    [facility]   Place of Service:  OFFICE   Chief Complaint  Patient presents with  . Medical Management of Chronic Issues    1 month follow-up    HPI:  81 yo female seen today for f/u dementia. Grandson present and states her memory is no better. She has fallen twice in the last 2 weeks. No behavioral issues. She is taking  aricept qhs.   HTN - BP elevated at [facility] 231/101 on yesterday after an unwitnessed fall. EMS called and repeat BP 146/88. She refused transport to ER at the time.  She c/o right hip pain and has difficulty with stepping and bearing weight on RLE. Xrays in July revealed no acute fx and intact surgical fixation of b/l hip  Past Medical History  Diagnosis Date  . Hypertension   . High cholesterol   . Osteoporosis   . Type II or unspecified type diabetes mellitus without mention of complication, not stated as uncontrolled   . Hyperosmolality and/or hypernatremia   . Dysphagia, unspecified(787.20)   . Other atopic dermatitis and related conditions   . Encounter for long-term (current) use of other medications   . Cardiac catheterisation as the cause of abnormal reaction of patient, or of later complication 10/05/2006  . Vascular dementia 06/22/2013    Past Surgical History  Procedure Laterality Date  . Femur surgery Right 03/2010  . Shoulder surgery Left 03/2010  . Femur im nail  07/24/2011    Procedure: INTRAMEDULLARY (IM) NAIL FEMORAL;  Surgeon: Javier Docker, MD;  Location: [facility];  Service: Orthopedics;  Laterality: Right;  Biomet, c-arm, Jackson table  . Intramedullary (im) nail intertrochanteric Left 07/30/2013    Procedure: INTRAMEDULLARY (IM) NAIL INTERTROCHANTRIC LEFT;  Surgeon: Shelda Pal, MD;  Location: [facility];  Service: Orthopedics;  Laterality: Left;  . Fracture surgery Left     Hip, Leg and shoulder    Patient Care Team: [name], DO as PCP - General  (Internal Medicine)  Social History   Social History  . Marital Status: Widowed    Spouse Name: N/A  . Number of Children: N/A  . Years of Education: N/A   Occupational History  . Not on file.   Social History Main Topics  . Smoking status: Never Smoker   . Smokeless tobacco: Never Used  . Alcohol Use: No  . Drug Use: No  . Sexual Activity: Not on file   Other Topics Concern  . Not on file   Social History Narrative     reports that she has never smoked. She has never used smokeless tobacco. She reports that she does not drink alcohol or use illicit drugs.  No Known Allergies  Medications: Patient's Medications  New Prescriptions   No medications on file  Previous Medications   AMBULATORY NON FORMULARY MEDICATION    Orders- Urinalysis if abnormal C+S  Diagnosis- Acute confusional state F05   ATORVASTATIN (LIPITOR) 10 MG TABLET    Take 1 tablet (10 mg total) by mouth daily.   COENZYME Q10 (CO Q 10) 100 MG CAPS    Take 1 tablet by mouth 2 (two) times daily.   COLESTIPOL (COLESTID) 1 G TABLET    Take 1 tablet (1 g total) by mouth daily.   DIPHENOXYLATE-ATROPINE (LOMOTIL) 2.5-0.025 MG PER TABLET    Take one tablet by mouth three times daily as  needed for diarrhea (control)   DONEPEZIL (ARICEPT) 5 MG TABLET    Take 1 tablet (5 mg total) by mouth at bedtime.   HYDROCORTISONE CREAM 0.5 %    Apply 1 application topically 2 (two) times daily.   HYDROCORTISONE OINTMENT 0.5 %    Apply to rash twice daily for 2 weeks, then use as needed   LOSARTAN (COZAAR) 50 MG TABLET    Take 1.5 tablets (75 mg total) by mouth daily. for blood pressure   MELATONIN 3 MG CAPS    Take 1 capsule (3 mg total) by mouth at bedtime and may repeat dose one time if needed.   METOPROLOL (LOPRESSOR) 50 MG TABLET    Take 1 tablet (50 mg total) by mouth 2 (two) times daily.   MULTIPLE VITAMIN (MULITIVITAMIN WITH MINERALS) TABS    Take 1 tablet by mouth daily. 2 in am, 1 in pm   SACCHAROMYCES BOULARDII  (FLORASTOR) 250 MG CAPSULE    Take 1 capsule (250 mg total) by mouth 2 (two) times daily.   VAYACOG 100-19.5-6.5 MG CAPS    TAKE (1) CAPSULE DAILY.  Modified Medications   No medications on file  Discontinued Medications   CIPROFLOXACIN (CIPRO) 250 MG TABLET    Take one tablet by mouth twice daily for 7 days for infection    Review of Systems  Unable to perform ROS: Dementia    Filed Vitals:   04/07/15 1554  Pulse: 64  Temp: 97.8 F (36.6 C)  TempSrc: Oral  Resp: 20  Height: 5' (1.524 m)  Weight: 116 lb 9.6 oz (52.889 kg)  SpO2: 97%  Blood pressure 160/98  ---> 140/88   Body mass index is 22.77 kg/(m^2).  Physical Exam  Constitutional: She appears well-developed.  Frail appearing  HENT:  Mouth/Throat: No oropharyngeal exudate.  Eyes: Pupils are equal, round, and reactive to light. No scleral icterus.  Neck: No tracheal deviation present.  Cardiovascular: Normal rate, regular rhythm and intact distal pulses.  Exam reveals no gallop and no friction rub.   Murmur (1/6 SEM) heard. Pulmonary/Chest: No stridor. No respiratory distress. She has no wheezes. She has no rales. She exhibits no tenderness.  Musculoskeletal: She exhibits edema and tenderness.  Gait unsteady with poor step/stance RLE causing antalgia. She uses a rolling walker  Lymphadenopathy:    She has no cervical adenopathy.  Neurological: She is alert.  Skin: Skin is warm and dry. No rash noted.  Psychiatric: She has a normal mood and affect. Her behavior is normal.     Labs reviewed: No visits with results within 3 Month(s) from this visit. Latest known visit with results is:  Office Visit on 01/02/2015  Component Date Value Ref Range Status  . WBC 01/02/2015 6.9  3.4 - 10.8 x10E3/uL Final  . RBC 01/02/2015 3.94  3.77 - 5.28 x10E6/uL Final  . Hemoglobin 01/02/2015 11.9  11.1 - 15.9 g/dL Final  . Hematocrit 40/98/1191 36.7  34.0 - 46.6 % Final  . MCV 01/02/2015 93  79 - 97 fL Final  . MCH 01/02/2015  30.2  26.6 - 33.0 pg Final  . MCHC 01/02/2015 32.4  31.5 - 35.7 g/dL Final  . RDW 47/82/9562 13.9  12.3 - 15.4 % Final  . Platelets 01/02/2015 233  150 - 379 x10E3/uL Final  . Neutrophils 01/02/2015 53   Final  . Lymphs 01/02/2015 33   Final  . Monocytes 01/02/2015 11   Final  . Eos 01/02/2015 3   Final  .  Basos 01/02/2015 0   Final  . Neutrophils Absolute 01/02/2015 3.6  1.4 - 7.0 x10E3/uL Final  . Lymphocytes Absolute 01/02/2015 2.3  0.7 - 3.1 x10E3/uL Final  . Monocytes Absolute 01/02/2015 0.8  0.1 - 0.9 x10E3/uL Final  . EOS (ABSOLUTE) 01/02/2015 0.2  0.0 - 0.4 x10E3/uL Final  . Basophils Absolute 01/02/2015 0.0  0.0 - 0.2 x10E3/uL Final  . Immature Granulocytes 01/02/2015 0   Final  . Immature Grans (Abs) 01/02/2015 0.0  0.0 - 0.1 x10E3/uL Final    No results found.   Assessment/Plan   ICD-9-CM ICD-10-CM   1. Mixed Alzheimer's and vascular dementia - failing to change as expected 331.0 G30.9    294.10 F01.50    290.40 F02.80   2. Essential hypertension - stable; elevated yesterday at facility probably due to fall prior to VS check 401.9 I10   3. Hip pain, unspecified laterality 719.45 M25.559    R>L with hx surgical fixation of b/l hip fx  4.      Frequent falls probably due to #1 and unsteady gait  Will refer to PT for gait training in light of fixation of both hips due past fractures.  Increase aricept to 10mg  at bedtime  Continue other medications as ordered  Check blood pressure 2 times daily and record. Call office if >160/90  Follow up in 1 month for dementia and blood pressure  Dearra Myhand S. Ancil Linseyarter, D. O., F. A. C. O. I.  [facility] [address] [place], [place] [address] [phone] Cell (Monday-Friday 8 AM - 5 PM) [phone] After 5 PM and follow prompts

## 2015-04-09 DIAGNOSIS — F0280 Dementia in other diseases classified elsewhere without behavioral disturbance: Principal | ICD-10-CM | POA: Insufficient documentation

## 2015-04-09 DIAGNOSIS — G309 Alzheimer's disease, unspecified: Principal | ICD-10-CM

## 2015-04-09 DIAGNOSIS — F0150 Vascular dementia without behavioral disturbance: Secondary | ICD-10-CM | POA: Insufficient documentation

## 2015-04-09 DIAGNOSIS — I10 Essential (primary) hypertension: Secondary | ICD-10-CM | POA: Insufficient documentation

## 2015-04-09 DIAGNOSIS — R2681 Unsteadiness on feet: Secondary | ICD-10-CM | POA: Insufficient documentation

## 2015-04-09 DIAGNOSIS — M25559 Pain in unspecified hip: Secondary | ICD-10-CM | POA: Insufficient documentation

## 2015-04-11 DIAGNOSIS — I10 Essential (primary) hypertension: Secondary | ICD-10-CM | POA: Diagnosis not present

## 2015-04-11 DIAGNOSIS — R2681 Unsteadiness on feet: Secondary | ICD-10-CM | POA: Diagnosis not present

## 2015-04-11 DIAGNOSIS — Z87440 Personal history of urinary (tract) infections: Secondary | ICD-10-CM | POA: Diagnosis not present

## 2015-04-11 DIAGNOSIS — D649 Anemia, unspecified: Secondary | ICD-10-CM | POA: Diagnosis not present

## 2015-04-11 DIAGNOSIS — M6281 Muscle weakness (generalized): Secondary | ICD-10-CM | POA: Diagnosis not present

## 2015-04-11 DIAGNOSIS — R296 Repeated falls: Secondary | ICD-10-CM | POA: Diagnosis not present

## 2015-04-11 DIAGNOSIS — G4700 Insomnia, unspecified: Secondary | ICD-10-CM | POA: Diagnosis not present

## 2015-04-11 DIAGNOSIS — F0390 Unspecified dementia without behavioral disturbance: Secondary | ICD-10-CM | POA: Diagnosis not present

## 2015-04-11 DIAGNOSIS — Z9181 History of falling: Secondary | ICD-10-CM | POA: Diagnosis not present

## 2015-04-12 ENCOUNTER — Telehealth: Payer: Self-pay | Admitting: *Deleted

## 2015-04-12 NOTE — Telephone Encounter (Signed)
[facility] for PT 2 times/wk for 1 week, 3 times/wk for 3 weeks and 2 times/wk for 2 weeks.  Verbal orders given

## 2015-04-13 DIAGNOSIS — G4700 Insomnia, unspecified: Secondary | ICD-10-CM | POA: Diagnosis not present

## 2015-04-13 DIAGNOSIS — R2681 Unsteadiness on feet: Secondary | ICD-10-CM | POA: Diagnosis not present

## 2015-04-13 DIAGNOSIS — D649 Anemia, unspecified: Secondary | ICD-10-CM | POA: Diagnosis not present

## 2015-04-13 DIAGNOSIS — Z87440 Personal history of urinary (tract) infections: Secondary | ICD-10-CM | POA: Diagnosis not present

## 2015-04-13 DIAGNOSIS — I10 Essential (primary) hypertension: Secondary | ICD-10-CM | POA: Diagnosis not present

## 2015-04-13 DIAGNOSIS — M6281 Muscle weakness (generalized): Secondary | ICD-10-CM | POA: Diagnosis not present

## 2015-04-13 DIAGNOSIS — R296 Repeated falls: Secondary | ICD-10-CM | POA: Diagnosis not present

## 2015-04-13 DIAGNOSIS — F0390 Unspecified dementia without behavioral disturbance: Secondary | ICD-10-CM | POA: Diagnosis not present

## 2015-04-13 DIAGNOSIS — Z9181 History of falling: Secondary | ICD-10-CM | POA: Diagnosis not present

## 2015-04-17 DIAGNOSIS — R296 Repeated falls: Secondary | ICD-10-CM | POA: Diagnosis not present

## 2015-04-17 DIAGNOSIS — G4700 Insomnia, unspecified: Secondary | ICD-10-CM | POA: Diagnosis not present

## 2015-04-17 DIAGNOSIS — Z9181 History of falling: Secondary | ICD-10-CM | POA: Diagnosis not present

## 2015-04-17 DIAGNOSIS — Z87440 Personal history of urinary (tract) infections: Secondary | ICD-10-CM | POA: Diagnosis not present

## 2015-04-17 DIAGNOSIS — M6281 Muscle weakness (generalized): Secondary | ICD-10-CM | POA: Diagnosis not present

## 2015-04-17 DIAGNOSIS — R2681 Unsteadiness on feet: Secondary | ICD-10-CM | POA: Diagnosis not present

## 2015-04-17 DIAGNOSIS — D649 Anemia, unspecified: Secondary | ICD-10-CM | POA: Diagnosis not present

## 2015-04-17 DIAGNOSIS — F0390 Unspecified dementia without behavioral disturbance: Secondary | ICD-10-CM | POA: Diagnosis not present

## 2015-04-17 DIAGNOSIS — I10 Essential (primary) hypertension: Secondary | ICD-10-CM | POA: Diagnosis not present

## 2015-04-18 DIAGNOSIS — R296 Repeated falls: Secondary | ICD-10-CM | POA: Diagnosis not present

## 2015-04-18 DIAGNOSIS — M6281 Muscle weakness (generalized): Secondary | ICD-10-CM | POA: Diagnosis not present

## 2015-04-18 DIAGNOSIS — G4700 Insomnia, unspecified: Secondary | ICD-10-CM | POA: Diagnosis not present

## 2015-04-18 DIAGNOSIS — I10 Essential (primary) hypertension: Secondary | ICD-10-CM | POA: Diagnosis not present

## 2015-04-18 DIAGNOSIS — F0390 Unspecified dementia without behavioral disturbance: Secondary | ICD-10-CM | POA: Diagnosis not present

## 2015-04-18 DIAGNOSIS — Z9181 History of falling: Secondary | ICD-10-CM | POA: Diagnosis not present

## 2015-04-18 DIAGNOSIS — Z87440 Personal history of urinary (tract) infections: Secondary | ICD-10-CM | POA: Diagnosis not present

## 2015-04-18 DIAGNOSIS — R2681 Unsteadiness on feet: Secondary | ICD-10-CM | POA: Diagnosis not present

## 2015-04-18 DIAGNOSIS — D649 Anemia, unspecified: Secondary | ICD-10-CM | POA: Diagnosis not present

## 2015-04-19 DIAGNOSIS — R296 Repeated falls: Secondary | ICD-10-CM | POA: Diagnosis not present

## 2015-04-19 DIAGNOSIS — D649 Anemia, unspecified: Secondary | ICD-10-CM | POA: Diagnosis not present

## 2015-04-19 DIAGNOSIS — G4700 Insomnia, unspecified: Secondary | ICD-10-CM | POA: Diagnosis not present

## 2015-04-19 DIAGNOSIS — Z87440 Personal history of urinary (tract) infections: Secondary | ICD-10-CM | POA: Diagnosis not present

## 2015-04-19 DIAGNOSIS — F0390 Unspecified dementia without behavioral disturbance: Secondary | ICD-10-CM | POA: Diagnosis not present

## 2015-04-19 DIAGNOSIS — M6281 Muscle weakness (generalized): Secondary | ICD-10-CM | POA: Diagnosis not present

## 2015-04-19 DIAGNOSIS — R2681 Unsteadiness on feet: Secondary | ICD-10-CM | POA: Diagnosis not present

## 2015-04-19 DIAGNOSIS — Z9181 History of falling: Secondary | ICD-10-CM | POA: Diagnosis not present

## 2015-04-19 DIAGNOSIS — I10 Essential (primary) hypertension: Secondary | ICD-10-CM | POA: Diagnosis not present

## 2015-04-24 DIAGNOSIS — R296 Repeated falls: Secondary | ICD-10-CM | POA: Diagnosis not present

## 2015-04-24 DIAGNOSIS — R2681 Unsteadiness on feet: Secondary | ICD-10-CM | POA: Diagnosis not present

## 2015-04-24 DIAGNOSIS — I10 Essential (primary) hypertension: Secondary | ICD-10-CM | POA: Diagnosis not present

## 2015-04-24 DIAGNOSIS — Z87440 Personal history of urinary (tract) infections: Secondary | ICD-10-CM | POA: Diagnosis not present

## 2015-04-24 DIAGNOSIS — Z9181 History of falling: Secondary | ICD-10-CM | POA: Diagnosis not present

## 2015-04-24 DIAGNOSIS — G4700 Insomnia, unspecified: Secondary | ICD-10-CM | POA: Diagnosis not present

## 2015-04-24 DIAGNOSIS — F0390 Unspecified dementia without behavioral disturbance: Secondary | ICD-10-CM | POA: Diagnosis not present

## 2015-04-24 DIAGNOSIS — M6281 Muscle weakness (generalized): Secondary | ICD-10-CM | POA: Diagnosis not present

## 2015-04-24 DIAGNOSIS — D649 Anemia, unspecified: Secondary | ICD-10-CM | POA: Diagnosis not present

## 2015-04-25 DIAGNOSIS — Z9181 History of falling: Secondary | ICD-10-CM | POA: Diagnosis not present

## 2015-04-25 DIAGNOSIS — G4700 Insomnia, unspecified: Secondary | ICD-10-CM | POA: Diagnosis not present

## 2015-04-25 DIAGNOSIS — R296 Repeated falls: Secondary | ICD-10-CM | POA: Diagnosis not present

## 2015-04-25 DIAGNOSIS — Z87440 Personal history of urinary (tract) infections: Secondary | ICD-10-CM | POA: Diagnosis not present

## 2015-04-25 DIAGNOSIS — R2681 Unsteadiness on feet: Secondary | ICD-10-CM | POA: Diagnosis not present

## 2015-04-25 DIAGNOSIS — F0390 Unspecified dementia without behavioral disturbance: Secondary | ICD-10-CM | POA: Diagnosis not present

## 2015-04-25 DIAGNOSIS — M6281 Muscle weakness (generalized): Secondary | ICD-10-CM | POA: Diagnosis not present

## 2015-04-25 DIAGNOSIS — I10 Essential (primary) hypertension: Secondary | ICD-10-CM | POA: Diagnosis not present

## 2015-04-25 DIAGNOSIS — D649 Anemia, unspecified: Secondary | ICD-10-CM | POA: Diagnosis not present

## 2015-04-28 DIAGNOSIS — I10 Essential (primary) hypertension: Secondary | ICD-10-CM | POA: Diagnosis not present

## 2015-04-28 DIAGNOSIS — G4700 Insomnia, unspecified: Secondary | ICD-10-CM | POA: Diagnosis not present

## 2015-04-28 DIAGNOSIS — Z9181 History of falling: Secondary | ICD-10-CM | POA: Diagnosis not present

## 2015-04-28 DIAGNOSIS — Z87440 Personal history of urinary (tract) infections: Secondary | ICD-10-CM | POA: Diagnosis not present

## 2015-04-28 DIAGNOSIS — D649 Anemia, unspecified: Secondary | ICD-10-CM | POA: Diagnosis not present

## 2015-04-28 DIAGNOSIS — R2681 Unsteadiness on feet: Secondary | ICD-10-CM | POA: Diagnosis not present

## 2015-04-28 DIAGNOSIS — M6281 Muscle weakness (generalized): Secondary | ICD-10-CM | POA: Diagnosis not present

## 2015-04-28 DIAGNOSIS — F0390 Unspecified dementia without behavioral disturbance: Secondary | ICD-10-CM | POA: Diagnosis not present

## 2015-04-28 DIAGNOSIS — R296 Repeated falls: Secondary | ICD-10-CM | POA: Diagnosis not present

## 2015-05-01 DIAGNOSIS — G4700 Insomnia, unspecified: Secondary | ICD-10-CM | POA: Diagnosis not present

## 2015-05-01 DIAGNOSIS — M6281 Muscle weakness (generalized): Secondary | ICD-10-CM | POA: Diagnosis not present

## 2015-05-01 DIAGNOSIS — R2681 Unsteadiness on feet: Secondary | ICD-10-CM | POA: Diagnosis not present

## 2015-05-01 DIAGNOSIS — Z9181 History of falling: Secondary | ICD-10-CM | POA: Diagnosis not present

## 2015-05-01 DIAGNOSIS — F0390 Unspecified dementia without behavioral disturbance: Secondary | ICD-10-CM | POA: Diagnosis not present

## 2015-05-01 DIAGNOSIS — D649 Anemia, unspecified: Secondary | ICD-10-CM | POA: Diagnosis not present

## 2015-05-01 DIAGNOSIS — I10 Essential (primary) hypertension: Secondary | ICD-10-CM | POA: Diagnosis not present

## 2015-05-01 DIAGNOSIS — Z87440 Personal history of urinary (tract) infections: Secondary | ICD-10-CM | POA: Diagnosis not present

## 2015-05-01 DIAGNOSIS — R296 Repeated falls: Secondary | ICD-10-CM | POA: Diagnosis not present

## 2015-05-03 DIAGNOSIS — Z87440 Personal history of urinary (tract) infections: Secondary | ICD-10-CM | POA: Diagnosis not present

## 2015-05-03 DIAGNOSIS — R296 Repeated falls: Secondary | ICD-10-CM | POA: Diagnosis not present

## 2015-05-03 DIAGNOSIS — Z9181 History of falling: Secondary | ICD-10-CM | POA: Diagnosis not present

## 2015-05-03 DIAGNOSIS — R2681 Unsteadiness on feet: Secondary | ICD-10-CM | POA: Diagnosis not present

## 2015-05-03 DIAGNOSIS — I10 Essential (primary) hypertension: Secondary | ICD-10-CM | POA: Diagnosis not present

## 2015-05-03 DIAGNOSIS — D649 Anemia, unspecified: Secondary | ICD-10-CM | POA: Diagnosis not present

## 2015-05-03 DIAGNOSIS — G4700 Insomnia, unspecified: Secondary | ICD-10-CM | POA: Diagnosis not present

## 2015-05-03 DIAGNOSIS — M6281 Muscle weakness (generalized): Secondary | ICD-10-CM | POA: Diagnosis not present

## 2015-05-03 DIAGNOSIS — F0390 Unspecified dementia without behavioral disturbance: Secondary | ICD-10-CM | POA: Diagnosis not present

## 2015-05-05 DIAGNOSIS — G4700 Insomnia, unspecified: Secondary | ICD-10-CM | POA: Diagnosis not present

## 2015-05-05 DIAGNOSIS — F0390 Unspecified dementia without behavioral disturbance: Secondary | ICD-10-CM | POA: Diagnosis not present

## 2015-05-05 DIAGNOSIS — M6281 Muscle weakness (generalized): Secondary | ICD-10-CM | POA: Diagnosis not present

## 2015-05-05 DIAGNOSIS — R296 Repeated falls: Secondary | ICD-10-CM | POA: Diagnosis not present

## 2015-05-05 DIAGNOSIS — Z9181 History of falling: Secondary | ICD-10-CM | POA: Diagnosis not present

## 2015-05-05 DIAGNOSIS — I10 Essential (primary) hypertension: Secondary | ICD-10-CM | POA: Diagnosis not present

## 2015-05-05 DIAGNOSIS — D649 Anemia, unspecified: Secondary | ICD-10-CM | POA: Diagnosis not present

## 2015-05-05 DIAGNOSIS — Z87440 Personal history of urinary (tract) infections: Secondary | ICD-10-CM | POA: Diagnosis not present

## 2015-05-05 DIAGNOSIS — R2681 Unsteadiness on feet: Secondary | ICD-10-CM | POA: Diagnosis not present

## 2015-05-10 DIAGNOSIS — Z9181 History of falling: Secondary | ICD-10-CM | POA: Diagnosis not present

## 2015-05-10 DIAGNOSIS — R2681 Unsteadiness on feet: Secondary | ICD-10-CM | POA: Diagnosis not present

## 2015-05-10 DIAGNOSIS — D649 Anemia, unspecified: Secondary | ICD-10-CM | POA: Diagnosis not present

## 2015-05-10 DIAGNOSIS — R296 Repeated falls: Secondary | ICD-10-CM | POA: Diagnosis not present

## 2015-05-10 DIAGNOSIS — G4700 Insomnia, unspecified: Secondary | ICD-10-CM | POA: Diagnosis not present

## 2015-05-10 DIAGNOSIS — Z87440 Personal history of urinary (tract) infections: Secondary | ICD-10-CM | POA: Diagnosis not present

## 2015-05-10 DIAGNOSIS — I10 Essential (primary) hypertension: Secondary | ICD-10-CM | POA: Diagnosis not present

## 2015-05-10 DIAGNOSIS — M6281 Muscle weakness (generalized): Secondary | ICD-10-CM | POA: Diagnosis not present

## 2015-05-10 DIAGNOSIS — F0390 Unspecified dementia without behavioral disturbance: Secondary | ICD-10-CM | POA: Diagnosis not present

## 2015-05-12 ENCOUNTER — Ambulatory Visit (INDEPENDENT_AMBULATORY_CARE_PROVIDER_SITE_OTHER): Payer: Medicare Other | Admitting: Internal Medicine

## 2015-05-12 ENCOUNTER — Encounter: Payer: Self-pay | Admitting: Internal Medicine

## 2015-05-12 VITALS — BP 128/82 | HR 64 | Temp 97.8°F | Resp 20 | Ht 60.0 in | Wt 117.2 lb

## 2015-05-12 DIAGNOSIS — F0280 Dementia in other diseases classified elsewhere without behavioral disturbance: Secondary | ICD-10-CM | POA: Diagnosis not present

## 2015-05-12 DIAGNOSIS — Z9181 History of falling: Secondary | ICD-10-CM | POA: Diagnosis not present

## 2015-05-12 DIAGNOSIS — Z87440 Personal history of urinary (tract) infections: Secondary | ICD-10-CM | POA: Diagnosis not present

## 2015-05-12 DIAGNOSIS — G309 Alzheimer's disease, unspecified: Secondary | ICD-10-CM | POA: Diagnosis not present

## 2015-05-12 DIAGNOSIS — F0390 Unspecified dementia without behavioral disturbance: Secondary | ICD-10-CM | POA: Diagnosis not present

## 2015-05-12 DIAGNOSIS — F0150 Vascular dementia without behavioral disturbance: Secondary | ICD-10-CM

## 2015-05-12 DIAGNOSIS — R2681 Unsteadiness on feet: Secondary | ICD-10-CM | POA: Diagnosis not present

## 2015-05-12 DIAGNOSIS — I10 Essential (primary) hypertension: Secondary | ICD-10-CM | POA: Diagnosis not present

## 2015-05-12 DIAGNOSIS — M6281 Muscle weakness (generalized): Secondary | ICD-10-CM | POA: Diagnosis not present

## 2015-05-12 DIAGNOSIS — R296 Repeated falls: Secondary | ICD-10-CM | POA: Diagnosis not present

## 2015-05-12 DIAGNOSIS — G4700 Insomnia, unspecified: Secondary | ICD-10-CM | POA: Diagnosis not present

## 2015-05-12 DIAGNOSIS — D649 Anemia, unspecified: Secondary | ICD-10-CM | POA: Diagnosis not present

## 2015-05-12 NOTE — Patient Instructions (Signed)
Reduce blood pressure checks to daily and record. Bring diary to next appointment  Continue current medications as ordered  Continue PT as ordered  Follow up in 2 mos for routine visit

## 2015-05-12 NOTE — Progress Notes (Signed)
Patient ID: Beverly Barnett, female   DOB: [birth year], 81 y.o.   MRN: [id number]    Location:    [facility]   Place of Service:  OFFICE   Chief Complaint  Patient presents with  . Medical Management of Chronic Issues    1 month follow-up for B/P    HPI:  81 yo female seen today for f/u dementia and HTN. Dementia a little better. aricept increased at last OV. BP at [facility] 110-140s/60-70s usually and occasionally SBP>150. She has been doing well with PT. No falls since last OV. She does lean to right a lot but that has improved in the last week. She attends exercise daily.   She is a poor historian due to dementia. Hx obtained from daughter.  Past Medical History  Diagnosis Date  . Hypertension   . High cholesterol   . Osteoporosis   . Type II or unspecified type diabetes mellitus without mention of complication, not stated as uncontrolled   . Hyperosmolality and/or hypernatremia   . Dysphagia, unspecified(787.20)   . Other atopic dermatitis and related conditions   . Encounter for long-term (current) use of other medications   . Cardiac catheterisation as the cause of abnormal reaction of patient, or of later complication 10/05/2006  . Vascular dementia 06/22/2013    Past Surgical History  Procedure Laterality Date  . Femur surgery Right 03/2010  . Shoulder surgery Left 03/2010  . Femur im nail  07/24/2011    Procedure: INTRAMEDULLARY (IM) NAIL FEMORAL;  Surgeon: Javier Docker, MD;  Location: [facility];  Service: Orthopedics;  Laterality: Right;  Biomet, c-arm, Jackson table  . Intramedullary (im) nail intertrochanteric Left 07/30/2013    Procedure: INTRAMEDULLARY (IM) NAIL INTERTROCHANTRIC LEFT;  Surgeon: Shelda Pal, MD;  Location: [facility];  Service: Orthopedics;  Laterality: Left;  . Fracture surgery Left     Hip, Leg and shoulder    Patient Care Team: [name], DO as PCP - General (Internal Medicine)  Social History   Social History  . Marital Status: Widowed    Spouse  Name: N/A  . Number of Children: N/A  . Years of Education: N/A   Occupational History  . Not on file.   Social History Main Topics  . Smoking status: Never Smoker   . Smokeless tobacco: Never Used  . Alcohol Use: No  . Drug Use: No  . Sexual Activity: Not on file   Other Topics Concern  . Not on file   Social History Narrative     reports that she has never smoked. She has never used smokeless tobacco. She reports that she does not drink alcohol or use illicit drugs.  No Known Allergies  Medications: Patient's Medications  New Prescriptions   No medications on file  Previous Medications   AMBULATORY NON FORMULARY MEDICATION    Orders- Urinalysis if abnormal C+S  Diagnosis- Acute confusional state F05   ATORVASTATIN (LIPITOR) 10 MG TABLET    Take 1 tablet (10 mg total) by mouth daily.   COENZYME Q10 (CO Q 10) 100 MG CAPS    Take 1 tablet by mouth 2 (two) times daily.   COLESTIPOL (COLESTID) 1 G TABLET    Take 1 tablet (1 g total) by mouth daily.   DIPHENOXYLATE-ATROPINE (LOMOTIL) 2.5-0.025 MG PER TABLET    Take one tablet by mouth three times daily as needed for diarrhea (control)   DONEPEZIL (ARICEPT) 5 MG TABLET    Take 1 tablet (5 mg total)  by mouth at bedtime.   HYDROCORTISONE CREAM 0.5 %    Apply 1 application topically 2 (two) times daily.   HYDROCORTISONE OINTMENT 0.5 %    Apply to rash twice daily for 2 weeks, then use as needed   LOSARTAN (COZAAR) 50 MG TABLET    Take 1.5 tablets (75 mg total) by mouth daily. for blood pressure   MELATONIN 3 MG CAPS    Take 1 capsule (3 mg total) by mouth at bedtime and may repeat dose one time if needed.   METOPROLOL (LOPRESSOR) 50 MG TABLET    Take 1 tablet (50 mg total) by mouth 2 (two) times daily.   MULTIPLE VITAMIN (MULITIVITAMIN WITH MINERALS) TABS    Take 1 tablet by mouth daily. 2 in am, 1 in pm   SACCHAROMYCES BOULARDII (FLORASTOR) 250 MG CAPSULE    Take 1 capsule (250 mg total) by mouth 2 (two) times daily.   VAYACOG  100-19.5-6.5 MG CAPS    TAKE (1) CAPSULE DAILY.  Modified Medications   No medications on file  Discontinued Medications   No medications on file    Review of Systems  Unable to perform ROS: Dementia    Filed Vitals:   05/12/15 1544  BP: 128/82  Pulse: 64  Temp: 97.8 F (36.6 C)  TempSrc: Oral  Resp: 20  Height: 5' (1.524 m)  Weight: 117 lb 3.2 oz (53.162 kg)  SpO2: 98%   Body mass index is 22.89 kg/(m^2).  Physical Exam  Constitutional: She appears well-developed. No distress.  Frail appearing in NAD  Cardiovascular: Regular rhythm and normal pulses.   No extrasystoles are present.  No LE edema b/l. No calf TTP  Pulmonary/Chest: Effort normal and breath sounds normal. She has no wheezes. She has no rhonchi. She has no rales.  Neurological: She is alert.  Skin: Skin is warm and dry. No rash noted.  Psychiatric: She has a normal mood and affect. Her behavior is normal.     Labs reviewed: No visits with results within 3 Month(s) from this visit. Latest known visit with results is:  Office Visit on 01/02/2015  Component Date Value Ref Range Status  . WBC 01/02/2015 6.9  3.4 - 10.8 x10E3/uL Final  . RBC 01/02/2015 3.94  3.77 - 5.28 x10E6/uL Final  . Hemoglobin 01/02/2015 11.9  11.1 - 15.9 g/dL Final  . Hematocrit 65/78/4696 36.7  34.0 - 46.6 % Final  . MCV 01/02/2015 93  79 - 97 fL Final  . MCH 01/02/2015 30.2  26.6 - 33.0 pg Final  . MCHC 01/02/2015 32.4  31.5 - 35.7 g/dL Final  . RDW 29/52/8413 13.9  12.3 - 15.4 % Final  . Platelets 01/02/2015 233  150 - 379 x10E3/uL Final  . Neutrophils 01/02/2015 53   Final  . Lymphs 01/02/2015 33   Final  . Monocytes 01/02/2015 11   Final  . Eos 01/02/2015 3   Final  . Basos 01/02/2015 0   Final  . Neutrophils Absolute 01/02/2015 3.6  1.4 - 7.0 x10E3/uL Final  . Lymphocytes Absolute 01/02/2015 2.3  0.7 - 3.1 x10E3/uL Final  . Monocytes Absolute 01/02/2015 0.8  0.1 - 0.9 x10E3/uL Final  . EOS (ABSOLUTE) 01/02/2015 0.2  0.0  - 0.4 x10E3/uL Final  . Basophils Absolute 01/02/2015 0.0  0.0 - 0.2 x10E3/uL Final  . Immature Granulocytes 01/02/2015 0   Final  . Immature Grans (Abs) 01/02/2015 0.0  0.0 - 0.1 x10E3/uL Final    No results found.  Assessment/Plan   ICD-9-CM ICD-10-CM   1. Essential hypertension - stable 401.9 I10   2. Mixed Alzheimer's and vascular dementia - stable 331.0 G30.9    294.10 F01.50    290.40 F02.80     Reduce blood pressure checks to daily and record. Bring diary to next appointment  Continue current medications as ordered  Continue PT as ordered  Follow up in 2 mos for routine visit  Kelise Kuch S. Ancil Linseyarter, D. O., F. A. C. O. I.  [facility] [address] [place], [place] [address] [phone] Cell (Monday-Friday 8 AM - 5 PM) [phone] After 5 PM and follow prompts

## 2015-05-16 DIAGNOSIS — I10 Essential (primary) hypertension: Secondary | ICD-10-CM | POA: Diagnosis not present

## 2015-05-16 DIAGNOSIS — M6281 Muscle weakness (generalized): Secondary | ICD-10-CM | POA: Diagnosis not present

## 2015-05-16 DIAGNOSIS — Z9181 History of falling: Secondary | ICD-10-CM | POA: Diagnosis not present

## 2015-05-16 DIAGNOSIS — R296 Repeated falls: Secondary | ICD-10-CM | POA: Diagnosis not present

## 2015-05-16 DIAGNOSIS — D649 Anemia, unspecified: Secondary | ICD-10-CM | POA: Diagnosis not present

## 2015-05-16 DIAGNOSIS — R2681 Unsteadiness on feet: Secondary | ICD-10-CM | POA: Diagnosis not present

## 2015-05-16 DIAGNOSIS — G4700 Insomnia, unspecified: Secondary | ICD-10-CM | POA: Diagnosis not present

## 2015-05-16 DIAGNOSIS — F0390 Unspecified dementia without behavioral disturbance: Secondary | ICD-10-CM | POA: Diagnosis not present

## 2015-05-16 DIAGNOSIS — Z87440 Personal history of urinary (tract) infections: Secondary | ICD-10-CM | POA: Diagnosis not present

## 2015-05-17 DIAGNOSIS — R296 Repeated falls: Secondary | ICD-10-CM | POA: Diagnosis not present

## 2015-05-17 DIAGNOSIS — D649 Anemia, unspecified: Secondary | ICD-10-CM | POA: Diagnosis not present

## 2015-05-17 DIAGNOSIS — Z9181 History of falling: Secondary | ICD-10-CM | POA: Diagnosis not present

## 2015-05-17 DIAGNOSIS — R2681 Unsteadiness on feet: Secondary | ICD-10-CM | POA: Diagnosis not present

## 2015-05-17 DIAGNOSIS — Z87440 Personal history of urinary (tract) infections: Secondary | ICD-10-CM | POA: Diagnosis not present

## 2015-05-17 DIAGNOSIS — I10 Essential (primary) hypertension: Secondary | ICD-10-CM | POA: Diagnosis not present

## 2015-05-17 DIAGNOSIS — F0390 Unspecified dementia without behavioral disturbance: Secondary | ICD-10-CM | POA: Diagnosis not present

## 2015-05-17 DIAGNOSIS — M6281 Muscle weakness (generalized): Secondary | ICD-10-CM | POA: Diagnosis not present

## 2015-05-17 DIAGNOSIS — G4700 Insomnia, unspecified: Secondary | ICD-10-CM | POA: Diagnosis not present

## 2015-05-19 DIAGNOSIS — M6281 Muscle weakness (generalized): Secondary | ICD-10-CM | POA: Diagnosis not present

## 2015-05-19 DIAGNOSIS — R262 Difficulty in walking, not elsewhere classified: Secondary | ICD-10-CM | POA: Diagnosis not present

## 2015-05-23 DIAGNOSIS — M6281 Muscle weakness (generalized): Secondary | ICD-10-CM | POA: Diagnosis not present

## 2015-05-23 DIAGNOSIS — R262 Difficulty in walking, not elsewhere classified: Secondary | ICD-10-CM | POA: Diagnosis not present

## 2015-05-25 DIAGNOSIS — M6281 Muscle weakness (generalized): Secondary | ICD-10-CM | POA: Diagnosis not present

## 2015-05-25 DIAGNOSIS — R262 Difficulty in walking, not elsewhere classified: Secondary | ICD-10-CM | POA: Diagnosis not present

## 2015-05-26 DIAGNOSIS — R262 Difficulty in walking, not elsewhere classified: Secondary | ICD-10-CM | POA: Diagnosis not present

## 2015-05-26 DIAGNOSIS — M6281 Muscle weakness (generalized): Secondary | ICD-10-CM | POA: Diagnosis not present

## 2015-05-29 DIAGNOSIS — M6281 Muscle weakness (generalized): Secondary | ICD-10-CM | POA: Diagnosis not present

## 2015-05-29 DIAGNOSIS — R262 Difficulty in walking, not elsewhere classified: Secondary | ICD-10-CM | POA: Diagnosis not present

## 2015-05-31 DIAGNOSIS — R262 Difficulty in walking, not elsewhere classified: Secondary | ICD-10-CM | POA: Diagnosis not present

## 2015-05-31 DIAGNOSIS — M6281 Muscle weakness (generalized): Secondary | ICD-10-CM | POA: Diagnosis not present

## 2015-06-02 DIAGNOSIS — R262 Difficulty in walking, not elsewhere classified: Secondary | ICD-10-CM | POA: Diagnosis not present

## 2015-06-02 DIAGNOSIS — M6281 Muscle weakness (generalized): Secondary | ICD-10-CM | POA: Diagnosis not present

## 2015-06-06 DIAGNOSIS — M6281 Muscle weakness (generalized): Secondary | ICD-10-CM | POA: Diagnosis not present

## 2015-06-06 DIAGNOSIS — R262 Difficulty in walking, not elsewhere classified: Secondary | ICD-10-CM | POA: Diagnosis not present

## 2015-06-07 DIAGNOSIS — M6281 Muscle weakness (generalized): Secondary | ICD-10-CM | POA: Diagnosis not present

## 2015-06-07 DIAGNOSIS — R262 Difficulty in walking, not elsewhere classified: Secondary | ICD-10-CM | POA: Diagnosis not present

## 2015-06-09 DIAGNOSIS — M6281 Muscle weakness (generalized): Secondary | ICD-10-CM | POA: Diagnosis not present

## 2015-06-09 DIAGNOSIS — R262 Difficulty in walking, not elsewhere classified: Secondary | ICD-10-CM | POA: Diagnosis not present

## 2015-06-10 ENCOUNTER — Emergency Department (HOSPITAL_COMMUNITY): Payer: Medicare Other

## 2015-06-10 ENCOUNTER — Emergency Department (HOSPITAL_COMMUNITY)
Admission: EM | Admit: 2015-06-10 | Discharge: 2015-06-11 | Disposition: A | Discharge status: Home / Self Care | Payer: Medicare Other | Attending: Emergency Medicine | Admitting: Emergency Medicine

## 2015-06-10 ENCOUNTER — Encounter (HOSPITAL_COMMUNITY): Payer: Self-pay | Admitting: Emergency Medicine

## 2015-06-10 DIAGNOSIS — J3489 Other specified disorders of nose and nasal sinuses: Secondary | ICD-10-CM | POA: Diagnosis not present

## 2015-06-10 DIAGNOSIS — R05 Cough: Secondary | ICD-10-CM | POA: Diagnosis not present

## 2015-06-10 DIAGNOSIS — I10 Essential (primary) hypertension: Secondary | ICD-10-CM | POA: Insufficient documentation

## 2015-06-10 DIAGNOSIS — F0150 Vascular dementia without behavioral disturbance: Secondary | ICD-10-CM | POA: Insufficient documentation

## 2015-06-10 DIAGNOSIS — Z79899 Other long term (current) drug therapy: Secondary | ICD-10-CM | POA: Diagnosis not present

## 2015-06-10 DIAGNOSIS — R404 Transient alteration of awareness: Secondary | ICD-10-CM | POA: Diagnosis not present

## 2015-06-10 DIAGNOSIS — E119 Type 2 diabetes mellitus without complications: Secondary | ICD-10-CM | POA: Diagnosis not present

## 2015-06-10 DIAGNOSIS — N3090 Cystitis, unspecified without hematuria: Secondary | ICD-10-CM | POA: Diagnosis not present

## 2015-06-10 DIAGNOSIS — Z7952 Long term (current) use of systemic steroids: Secondary | ICD-10-CM | POA: Diagnosis not present

## 2015-06-10 DIAGNOSIS — N3000 Acute cystitis without hematuria: Secondary | ICD-10-CM

## 2015-06-10 DIAGNOSIS — E7800 Pure hypercholesterolemia, unspecified: Secondary | ICD-10-CM | POA: Diagnosis not present

## 2015-06-10 DIAGNOSIS — Z872 Personal history of diseases of the skin and subcutaneous tissue: Secondary | ICD-10-CM | POA: Insufficient documentation

## 2015-06-10 DIAGNOSIS — R0981 Nasal congestion: Secondary | ICD-10-CM | POA: Insufficient documentation

## 2015-06-10 DIAGNOSIS — Z8739 Personal history of other diseases of the musculoskeletal system and connective tissue: Secondary | ICD-10-CM | POA: Diagnosis not present

## 2015-06-10 DIAGNOSIS — B9689 Other specified bacterial agents as the cause of diseases classified elsewhere: Secondary | ICD-10-CM | POA: Diagnosis not present

## 2015-06-10 DIAGNOSIS — R059 Cough, unspecified: Secondary | ICD-10-CM

## 2015-06-10 DIAGNOSIS — R531 Weakness: Secondary | ICD-10-CM | POA: Diagnosis not present

## 2015-06-10 LAB — COMPREHENSIVE METABOLIC PANEL
ALBUMIN: 3.5 g/dL (ref 3.5–5.0)
ALK PHOS: 65 U/L (ref 38–126)
ALT: 14 U/L (ref 14–54)
ANION GAP: 8 (ref 5–15)
AST: 19 U/L (ref 15–41)
BILIRUBIN TOTAL: 0.5 mg/dL (ref 0.3–1.2)
BUN: 23 mg/dL — ABNORMAL HIGH (ref 6–20)
CALCIUM: 9 mg/dL (ref 8.9–10.3)
CO2: 25 mmol/L (ref 22–32)
CREATININE: 0.74 mg/dL (ref 0.44–1.00)
Chloride: 104 mmol/L (ref 101–111)
GFR calc non Af Amer: >60 mL/min (ref >60)
GLUCOSE: 119 mg/dL — AB (ref 65–99)
Potassium: 4.3 mmol/L (ref 3.5–5.1)
Sodium: 137 mmol/L (ref 135–145)
TOTAL PROTEIN: 7.1 g/dL (ref 6.5–8.1)

## 2015-06-10 LAB — CBC WITH DIFFERENTIAL/PLATELET
Basophils Absolute: 0 10*3/uL (ref 0.0–0.1)
Basophils Relative: 0 %
Eosinophils Absolute: 0.2 10*3/uL (ref 0.0–0.7)
Eosinophils Relative: 2 %
HEMATOCRIT: 37.3 % (ref 36.0–46.0)
HEMOGLOBIN: 12.2 g/dL (ref 12.0–15.0)
LYMPHS ABS: 2.3 10*3/uL (ref 0.7–4.0)
LYMPHS PCT: 25 %
MCH: 30.3 pg (ref 26.0–34.0)
MCHC: 32.7 g/dL (ref 30.0–36.0)
MCV: 92.8 fL (ref 78.0–100.0)
MONOS PCT: 14 %
Monocytes Absolute: 1.3 10*3/uL — ABNORMAL HIGH (ref 0.1–1.0)
NEUTROS ABS: 5.3 10*3/uL (ref 1.7–7.7)
NEUTROS PCT: 59 %
Platelets: 182 10*3/uL (ref 150–400)
RBC: 4.02 MIL/uL (ref 3.87–5.11)
RDW: 13.7 % (ref 11.5–15.5)
WBC: 9.1 10*3/uL (ref 4.0–10.5)

## 2015-06-10 MED ORDER — GUAIFENESIN ER 600 MG PO TB12
600.0000 mg | ORAL_TABLET | Freq: Two times a day (BID) | ORAL | Status: DC
  Administered 2015-06-10: 600 mg via ORAL
  Filled 2015-06-10: qty 1

## 2015-06-10 MED ORDER — AZITHROMYCIN 250 MG PO TABS: 250.0000 mg | ORAL_TABLET | Freq: Every day | ORAL | Status: DC

## 2015-06-10 MED ORDER — BENZONATATE 100 MG PO CAPS: 100.0000 mg | ORAL_CAPSULE | Freq: Three times a day (TID) | ORAL | Status: DC

## 2015-06-10 MED ORDER — GUAIFENESIN ER 600 MG PO TB12: 600.0000 mg | ORAL_TABLET | Freq: Two times a day (BID) | ORAL | Status: DC

## 2015-06-10 NOTE — ED Provider Notes (Signed)
CSN: [id number]     Arrival date & time 06/10/15  2140 History   First MD Initiated Contact with Patient 06/10/15 2144     Chief Complaint  Patient presents with  . Nasal Congestion     (Consider location/radiation/quality/duration/timing/severity/associated sxs/prior Treatment) HPI   Beverly Barnett is a 81 y.o. female, with a history of hypertension, DM, vascular dementia, presenting to the ED with rhinorrhea, nasal congestion, and a dry cough for the last 3 days. Pt is from [facility] at [facility]. Pt denies chest pain, shortness of breath, fever/chills, N/V, facial pain, or any other complaints.     Past Medical History  Diagnosis Date  . Hypertension   . High cholesterol   . Osteoporosis   . Type II or unspecified type diabetes mellitus without mention of complication, not stated as uncontrolled   . Hyperosmolality and/or hypernatremia   . Dysphagia, unspecified(787.20)   . Other atopic dermatitis and related conditions   . Encounter for long-term (current) use of other medications   . Cardiac catheterisation as the cause of abnormal reaction of patient, or of later complication 10/05/2006  . Vascular dementia 06/22/2013   Past Surgical History  Procedure Laterality Date  . Femur surgery Right 03/2010  . Shoulder surgery Left 03/2010  . Femur im nail  07/24/2011    Procedure: INTRAMEDULLARY (IM) NAIL FEMORAL;  Surgeon: Javier Docker, MD;  Location: [facility];  Service: Orthopedics;  Laterality: Right;  Biomet, c-arm, Jackson table  . Intramedullary (im) nail intertrochanteric Left 07/30/2013    Procedure: INTRAMEDULLARY (IM) NAIL INTERTROCHANTRIC LEFT;  Surgeon: Shelda Pal, MD;  Location: [facility];  Service: Orthopedics;  Laterality: Left;  . Fracture surgery Left     Hip, Leg and shoulder   Family History  Problem Relation Age of Onset  . Cancer Sister   . Heart disease Father    Social History  Substance Use Topics  . Smoking status: Never Smoker   . Smokeless  tobacco: Never Used  . Alcohol Use: No   OB History    No data available     Review of Systems  Constitutional: Negative for fever and chills.  HENT: Positive for congestion and rhinorrhea.   Respiratory: Negative for shortness of breath.   Cardiovascular: Negative for chest pain.  Gastrointestinal: Negative for nausea and vomiting.  All other systems reviewed and are negative.     Allergies  Review of patient's allergies indicates no known allergies.  Home Medications   Prior to Admission medications   Medication Sig Start Date End Date Taking? Authorizing Provider  Coenzyme Q10 (CO Q 10) 100 MG CAPS Take 1 tablet by mouth 2 (two) times daily.   Yes Historical Provider, MD  colestipol (COLESTID) 1 G tablet Take 1 tablet (1 g total) by mouth daily. 10/07/14  Yes Tiffany L Reed, DO  diphenoxylate-atropine (LOMOTIL) 2.5-0.025 MG per tablet Take one tablet by mouth three times daily as needed for diarrhea (control) 12/20/14  Yes Sharon Seller, NP  donepezil (ARICEPT) 10 MG tablet Take 10 mg by mouth at bedtime.   Yes Historical Provider, MD  hydrocortisone cream 0.5 % Apply 1 application topically 2 (two) times daily. Patient taking differently: Apply 1 application topically 2 (two) times daily as needed (for rash on face).  03/03/15  Yes Monica Carter, DO  losartan (COZAAR) 50 MG tablet Take 1.5 tablets (75 mg total) by mouth daily. for blood pressure 10/07/14  Yes Tiffany Alroy Dust, DO  Melatonin 3 MG CAPS Take 1 capsule (3 mg total) by mouth at bedtime and may repeat dose one time if needed. 10/07/14  Yes Tiffany L Reed, DO  metoprolol (LOPRESSOR) 50 MG tablet Take 1 tablet (50 mg total) by mouth 2 (two) times daily. 10/07/14  Yes Tiffany L Reed, DO  Multiple Vitamin (MULITIVITAMIN WITH MINERALS) TABS Take 1 tablet by mouth daily.    Yes Historical Provider, MD  saccharomyces boulardii (FLORASTOR) 250 MG capsule Take 1 capsule (250 mg total) by mouth 2 (two) times daily. 10/07/14  Yes  Tiffany L Reed, DO  VAYACOG 100-19.5-6.5 MG CAPS TAKE (1) CAPSULE DAILY. 02/20/15  Yes Kimber RelicArthur G Green, MD  benzonatate (TESSALON) 100 MG capsule Take 1 capsule (100 mg total) by mouth every 8 (eight) hours. 06/10/15   Shawn C Joy, PA-C  cephALEXin (KEFLEX) 500 MG capsule Take 1 capsule (500 mg total) by mouth 4 (four) times daily. 06/11/15   Shawn C Joy, PA-C  guaiFENesin (MUCINEX) 600 MG 12 hr tablet Take 1 tablet (600 mg total) by mouth 2 (two) times daily. 06/10/15   Shawn C Joy, PA-C   BP 174/69 mmHg  Pulse 67  Temp(Src) 98.9 F (37.2 C) (Oral)  Resp 18  SpO2 97% Physical Exam  Constitutional: She is oriented to person, place, and time. She appears well-developed and well-nourished. No distress.  HENT:  Head: Normocephalic and atraumatic.  Mouth/Throat: Oropharynx is clear and moist.  Eyes: Conjunctivae and EOM are normal. Pupils are equal, round, and reactive to light.  Neck: Normal range of motion. Neck supple.  Cardiovascular: Normal rate, regular rhythm, normal heart sounds and intact distal pulses.   Pulmonary/Chest: Effort normal and breath sounds normal. No respiratory distress.  Abdominal: Soft. Bowel sounds are normal.  Musculoskeletal: She exhibits no edema or tenderness.  Lymphadenopathy:    She has no cervical adenopathy.  Neurological: She is alert and oriented to person, place, and time. She has normal reflexes.  No sensory deficits. Strength 5/5 in all extremities. Gait testing deferred. Coordination intact. Cranial nerves III-XII grossly intact. No facial droop.   Skin: Skin is warm and dry. She is not diaphoretic.  Nursing note and vitals reviewed.   ED Course  Procedures (including critical care time) Labs Review Labs Reviewed  CBC WITH DIFFERENTIAL/PLATELET - Abnormal; Notable for the following:    Monocytes Absolute 1.3 (*)    All other components within normal limits  COMPREHENSIVE METABOLIC PANEL - Abnormal; Notable for the following:    Glucose, Bld 119  (*)    BUN 23 (*)    All other components within normal limits  URINALYSIS, ROUTINE W REFLEX MICROSCOPIC (NOT AT [facility]) - Abnormal; Notable for the following:    APPearance CLOUDY (*)    Leukocytes, UA MODERATE (*)    All other components within normal limits  URINE MICROSCOPIC-ADD ON - Abnormal; Notable for the following:    Squamous Epithelial / LPF 0-5 (*)    Bacteria, UA MANY (*)    All other components within normal limits    Imaging Review Dg Chest 2 View  06/10/2015  CLINICAL DATA:  81 year old female with dry cough EXAM: CHEST  2 VIEW COMPARISON:  Radiograph dated 07/1913 FINDINGS: Two views of the chest demonstrate emphysematous changes of the lungs. No focal consolidation, pleural effusion, or pneumothorax. Stable cardiac silhouette. Advanced osteopenia with degenerative changes of the spine. Left humeral fixation plate. IMPRESSION: No active cardiopulmonary disease. Electronically Signed   By: Ceasar MonsArash  Radparvar M.D.  On: 06/10/2015 23:00   I have personally reviewed and evaluated these images and lab results as part of my medical decision-making.   EKG Interpretation None      MDM   Final diagnoses:  Nasal congestion  Cough  Acute cystitis without hematuria    Beverly Barnett presents from a SNF with rhinorrhea, nasal congestion, nonproductive cough, and some weakness for the last 3 days.  Findings and plan of care discussed with Eber Hong, MD.  This patient presentation is consistent with a cold-like virus versus environmental allergies. Patient is afebrile, nontoxic-appearing, not tachycardic, not tachypneic, maintains SPO2 97-98% on room air, and is in no apparent distress. Suspicion for influenza is very low.  Patient's son arrived and stated that the patient seemed to be weaker and a little bit more confused than normal. States that whenever this happens the patient usually has a UTI. UA reveals UTI. Treatment initiated here in the ED with IV Rocephin. Patient  sent home with by mouth Keflex. The patient and patient's son were given instructions for home care as well as return precautions. Patient and her son voice understanding of these instructions, accept the plan, and are comfortable with discharge.  Filed Vitals:   06/10/15 2144 06/10/15 2153  BP: 159/74 174/69  Pulse: 67 67  Temp: 98.7 F (37.1 C) 98.9 F (37.2 C)  TempSrc: Oral Oral  Resp: 16 18  SpO2: 98% 97%     Anselm Pancoast, PA-C 06/11/15 0110  Eber Hong, MD 06/11/15 2326

## 2015-06-10 NOTE — ED Notes (Signed)
Bed: [id number] Expected date:  Expected time:  Means of arrival:  Comments: EMS 81 yo female from Assisted Living/congested and cold symptoms, increased confusion

## 2015-06-10 NOTE — ED Provider Notes (Signed)
The patient is an 81 year old female, she comes from her nursing facility with a complaint of cough with nasal congestion, according to her daughter who arrives late she has also been having slight difficulty walking and having some change in mental status though at this time she seems to be her normal self. On my exam she has clear heart and lung sounds other than a soft late systolic murmur, soft abdomen which is nontender, no peripheral edema, she is awake alert and following commands but difficulty. She seems comfortable, she does have occasional rales at the bases but these do clear with deep breathing. Oropharynx is clear, no exudate asymmetry or hypertrophy. The patient should be stable for discharge but will need a urinalysis labs and chest x-ray.  Medical screening examination/treatment/procedure(s) were conducted as a shared visit with non-physician practitioner(s) and myself.  I personally evaluated the patient during the encounter.  Clinical Impression:   Final diagnoses:  Nasal congestion  Cough  Acute cystitis without hematuria         Eber HongBrian Lubertha Leite, MD 06/11/15 [phone]

## 2015-06-10 NOTE — ED Notes (Signed)
Brought in by EMS from [facility] with c/o colds symptoms.  Per EMS, pt was reported to have been having nasal congestion and or s/s colds.  Arrived to ED alert and verbally responsive, no s/s apparent respiratory distress noted.  Has dementia.

## 2015-06-11 DIAGNOSIS — F0390 Unspecified dementia without behavioral disturbance: Secondary | ICD-10-CM | POA: Diagnosis not present

## 2015-06-11 LAB — URINE MICROSCOPIC-ADD ON

## 2015-06-11 LAB — URINALYSIS, ROUTINE W REFLEX MICROSCOPIC
Bilirubin Urine: NEGATIVE
Glucose, UA: NEGATIVE mg/dL
Hgb urine dipstick: NEGATIVE
Ketones, ur: NEGATIVE mg/dL
NITRITE: NEGATIVE
PROTEIN: NEGATIVE mg/dL
SPECIFIC GRAVITY, URINE: 1.017 (ref 1.005–1.030)
pH: 6 (ref 5.0–8.0)

## 2015-06-11 MED ORDER — CEFTRIAXONE SODIUM 1 G IJ SOLR: 1.0000 g | Freq: Once | INTRAMUSCULAR | Status: DC

## 2015-06-11 MED ORDER — CEPHALEXIN 500 MG PO CAPS: 500.0000 mg | ORAL_CAPSULE | Freq: Four times a day (QID) | ORAL | Status: DC

## 2015-06-11 MED ORDER — CEFTRIAXONE SODIUM 1 G IJ SOLR
1.0000 g | Freq: Once | INTRAMUSCULAR | Status: AC
  Administered 2015-06-11: 1 g via INTRAVENOUS
  Filled 2015-06-11: qty 10

## 2015-06-11 MED ORDER — BENZONATATE 100 MG PO CAPS
100.0000 mg | ORAL_CAPSULE | Freq: Once | ORAL | Status: AC
  Administered 2015-06-11: 100 mg via ORAL
  Filled 2015-06-11: qty 1

## 2015-06-11 NOTE — ED Notes (Signed)
[facility] was given report on pt's condition and discharge instructions including three prescriptions.

## 2015-06-11 NOTE — ED Notes (Signed)
[facility] here to transport pt back to [facility].

## 2015-06-11 NOTE — Discharge Instructions (Signed)
You have been seen today for nasal congestion and a cough. The urinalysis also reveals that you have an UTI. Your imaging showed no abnormalities. Follow up with PCP as needed. Return to ED should symptoms worsen. The Keflex is for UTI.  Please take all of your antibiotics until finished!   You may develop abdominal discomfort or diarrhea from the antibiotic.  You may help offset this with probiotics which you can buy or get in yogurt. Do not eat or take the probiotics until 2 hours after your antibiotic.

## 2015-06-11 NOTE — ED Notes (Signed)
[facility] was called for pt's transportation back to [facility] facility.

## 2015-06-12 DIAGNOSIS — M6281 Muscle weakness (generalized): Secondary | ICD-10-CM | POA: Diagnosis not present

## 2015-06-12 DIAGNOSIS — R262 Difficulty in walking, not elsewhere classified: Secondary | ICD-10-CM | POA: Diagnosis not present

## 2015-06-13 DIAGNOSIS — M6281 Muscle weakness (generalized): Secondary | ICD-10-CM | POA: Diagnosis not present

## 2015-06-13 DIAGNOSIS — R262 Difficulty in walking, not elsewhere classified: Secondary | ICD-10-CM | POA: Diagnosis not present

## 2015-06-13 LAB — URINE CULTURE

## 2015-06-14 ENCOUNTER — Telehealth (HOSPITAL_BASED_OUTPATIENT_CLINIC_OR_DEPARTMENT_OTHER): Payer: Self-pay | Admitting: Emergency Medicine

## 2015-06-14 NOTE — Telephone Encounter (Signed)
Post ED Visit - Positive Culture Follow-up  Culture report reviewed by antimicrobial stewardship pharmacist:  []  Enzo BiNathan Batchelder, Pharm.D. []  Celedonio MiyamotoJeremy Frens, Pharm.D., BCPS []  Garvin FilaMike Maccia, Pharm.D. []  Georgina PillionElizabeth Martin, Pharm.D., BCPS []  DuffieldMinh Pham, [address]Ph[address]D., BCPS, AAHIVP []  Estella HuskMichelle Turner, Pharm.D., BCPS, AAHIVP [x]  Tennis Mustassie Stewart, Pharm.D. []  Sherle Poeob Vincent, [address]Pharm.D.  Positive urine culture Treated with azithromycin and cephalexin, organism sensitive to the same and no further patient follow-up is required at this time.  Berle MullMiller, Doctor Sheahan 06/14/2015, 9:33 AM

## 2015-06-16 DIAGNOSIS — M6281 Muscle weakness (generalized): Secondary | ICD-10-CM | POA: Diagnosis not present

## 2015-06-16 DIAGNOSIS — R262 Difficulty in walking, not elsewhere classified: Secondary | ICD-10-CM | POA: Diagnosis not present

## 2015-06-19 ENCOUNTER — Encounter: Payer: Self-pay | Admitting: Internal Medicine

## 2015-06-19 ENCOUNTER — Ambulatory Visit (INDEPENDENT_AMBULATORY_CARE_PROVIDER_SITE_OTHER): Payer: Medicare Other | Admitting: Internal Medicine

## 2015-06-19 VITALS — BP 152/68 | HR 61 | Temp 98.1°F | Ht 60.0 in | Wt 119.0 lb

## 2015-06-19 DIAGNOSIS — R0782 Intercostal pain: Secondary | ICD-10-CM

## 2015-06-19 DIAGNOSIS — M7918 Myalgia, other site: Secondary | ICD-10-CM

## 2015-06-19 MED ORDER — ACETAMINOPHEN 500 MG PO TABS: 500.0000 mg | ORAL_TABLET | Freq: Three times a day (TID) | ORAL | Status: AC

## 2015-06-19 NOTE — Progress Notes (Signed)
Patient ID: Beverly Barnett, female   DOB: [birth year], 82 y.o.   MRN: [id number]   Location: [facility] Provider: Gwenith Spitz. Renato Gails, D.O., C.M.D.  Goals of Care: Advanced Directive information Advanced Directives 06/19/2015  Does patient have an advance directive? No  Type of Advance Directive -  Does patient want to make changes to advanced directive? -  Copy of advanced directive(s) in chart? -  Would patient like information on creating an advanced directive? -  Pre-existing out of facility DNR order (yellow form or pink MOST form) -     Chief Complaint  Patient presents with  . Acute Visit    back pain in upper and middle back. No injury. Here with grandson Ayesha Rumpf    HPI: Patient is a 82 y.o. female seen in the office today for an acute visit for upper and midback pain.  Pain in her back seems worse when she takes a deep breath.  Had a few days of bad coughing. Has gradually improved.  Oxygen levels normal.  Pulse normal.  Has not fallen or injured herself.  Has not done new exercise. Pain worse when she lifts her right arm.  Coughing is almost gone.  She thinks the pain is also getting better.  Ayesha Rumpf was concerned about pneumonia, but she is afebrile.  Review of Systems:  Review of Systems  Constitutional: Negative for fever and chills.  Respiratory: Positive for cough. Negative for shortness of breath.   Cardiovascular: Negative for chest pain.  Gastrointestinal: Negative for abdominal pain.  Musculoskeletal: Positive for myalgias and back pain. Negative for falls.       Over right upper ribs near scapula  Neurological: Negative for dizziness.  Psychiatric/Behavioral: Positive for memory loss.    Past Medical History  Diagnosis Date  . Hypertension   . High cholesterol   . Osteoporosis   . Type II or unspecified type diabetes mellitus without mention of complication, not stated as uncontrolled   . Hyperosmolality and/or hypernatremia   . Dysphagia, unspecified(787.20)    . Other atopic dermatitis and related conditions   . Encounter for long-term (current) use of other medications   . Cardiac catheterisation as the cause of abnormal reaction of patient, or of later complication 10/05/2006  . Vascular dementia 06/22/2013    Past Surgical History  Procedure Laterality Date  . Femur surgery Right 03/2010  . Shoulder surgery Left 03/2010  . Femur im nail  07/24/2011    Procedure: INTRAMEDULLARY (IM) NAIL FEMORAL;  Surgeon: Javier Docker, MD;  Location: [facility];  Service: Orthopedics;  Laterality: Right;  Biomet, c-arm, Jackson table  . Intramedullary (im) nail intertrochanteric Left 07/30/2013    Procedure: INTRAMEDULLARY (IM) NAIL INTERTROCHANTRIC LEFT;  Surgeon: Shelda Pal, MD;  Location: [facility];  Service: Orthopedics;  Laterality: Left;  . Fracture surgery Left     Hip, Leg and shoulder    No Known Allergies    Medication List       This list is accurate as of: 06/19/15  3:54 PM.  Always use your most recent med list.               benzonatate 100 MG capsule  Commonly known as:  TESSALON  Take 1 capsule (100 mg total) by mouth every 8 (eight) hours.     Co Q 10 100 MG Caps  Take 1 tablet by mouth 2 (two) times daily.     colestipol 1 g tablet  Commonly known as:  COLESTID  Take 1 tablet (1 g total) by mouth daily.     diphenoxylate-atropine 2.5-0.025 MG tablet  Commonly known as:  LOMOTIL  Take one tablet by mouth three times daily as needed for diarrhea (control)     donepezil 10 MG tablet  Commonly known as:  ARICEPT  Take 10 mg by mouth at bedtime.     guaiFENesin 600 MG 12 hr tablet  Commonly known as:  MUCINEX  Take 1 tablet (600 mg total) by mouth 2 (two) times daily.     hydrocortisone cream 0.5 %  Apply 1 application topically 2 (two) times daily.     losartan 50 MG tablet  Commonly known as:  COZAAR  Take 1.5 tablets (75 mg total) by mouth daily. for blood pressure     Melatonin 3 MG Caps  Take 1 capsule (3 mg  total) by mouth at bedtime and may repeat dose one time if needed.     metoprolol 50 MG tablet  Commonly known as:  LOPRESSOR  Take 1 tablet (50 mg total) by mouth 2 (two) times daily.     multivitamin with minerals Tabs tablet  Take 1 tablet by mouth daily.     saccharomyces boulardii 250 MG capsule  Commonly known as:  FLORASTOR  Take 1 capsule (250 mg total) by mouth 2 (two) times daily.     VAYACOG 100-19.5-6.5 MG Caps  Generic drug:  Phosphatidylserine-DHA-EPA  TAKE (1) CAPSULE DAILY.        Physical Exam: Filed Vitals:   06/19/15 1532  BP: 152/68  Pulse: 61  Temp: 98.1 F (36.7 C)  TempSrc: Oral  Height: 5' (1.524 m)  Weight: 119 lb (53.978 kg)  SpO2: 97%   Body mass index is 23.24 kg/(m^2). Physical Exam  Constitutional: No distress.  Cardiovascular: Normal rate, regular rhythm, normal heart sounds and intact distal pulses.   Pulmonary/Chest: Effort normal and breath sounds normal. No respiratory distress.  Lungs clear; pox 97  Abdominal: Soft. Bowel sounds are normal.  Musculoskeletal: She exhibits tenderness.  Over right posterior ribs medial to scapula and inferior to scapula; worse when lifts right arm, takes deep breath or coughs  Neurological: She is alert.  Psychiatric: She has a normal mood and affect.    Labs reviewed: Basic Metabolic Panel:  Recent Labs  16/03/9602/31/16 1647 12/09/14 1132 06/10/15 2309  NA 140 142 137  K 4.2 4.8 4.3  CL 107 102 104  CO2 26 25 25   GLUCOSE 117* 95 119*  BUN 18 23 23*  CREATININE 0.85 0.94 0.74  CALCIUM 9.2 9.6 9.0   Liver Function Tests:  Recent Labs  09/04/14 1007 12/09/14 1132 06/10/15 2309  AST 32 16 19  ALT 25 12 14   ALKPHOS 48 71 65  BILITOT 0.6 0.4 0.5  PROT 6.7 7.0 7.1  ALBUMIN 3.7 4.0 3.5    Recent Labs  06/27/14 1620  LIPASE 44  AMYLASE 89   No results for input(s): AMMONIA in the last 8760 hours. CBC:  Recent Labs  09/04/14 1011 09/08/14 1647 12/09/14 1132 01/02/15 1009  06/10/15 2309  WBC  --  7.2 8.0 6.9 9.1  NEUTROABS  --  3.6 3.9 3.6 5.3  HGB 13.3 12.1  --   --  12.2  HCT 39.0 37.7 36.8 36.7 37.3  MCV  --  95.0 91 93 92.8  PLT  --  209 246 233 182   Lipid Panel:  Recent Labs  12/09/14 1132  CHOL 224*  HDL 41  LDLCALC 106*  TRIG 385*  CHOLHDL 5.5*   Lab Results  Component Value Date   HGBA1C 5.6 07/29/2013    Assessment/Plan 1. Intercostal muscle pain -due to recent coughing/URI -she is improving gradually -lungs clear, normal O2 and HR -advised warm compresses three times a day as needed for a week -also tylenol 500mg  tid scheduled for a week and then prn thereafter -if not getting better by next week, may need xrays of thoracic spine to eval for compression or rib fx from coughing--advised Colin to call us back if she is not getting better.  Next appt:  07/14/2015 keep with Dr. Arrie Eastern L. Abriel Hattery, D.O. Geriatrics [facility] [address], [place] [address] Cell Phone (Mon-Fri 8am-5pm):  [phone] On Call:  [phone] & follow prompts after 5pm & weekends Office Phone:  [phone] Office Fax:  [phone]

## 2015-06-19 NOTE — Patient Instructions (Signed)
Intercostal muscle pain from coughing  Warm compresses three times daily as needed AND scheduled tylenol 500mg  three times daily for a week, then three times daily as needed thereafter.

## 2015-06-20 DIAGNOSIS — M6281 Muscle weakness (generalized): Secondary | ICD-10-CM | POA: Diagnosis not present

## 2015-06-20 DIAGNOSIS — R262 Difficulty in walking, not elsewhere classified: Secondary | ICD-10-CM | POA: Diagnosis not present

## 2015-06-21 DIAGNOSIS — R262 Difficulty in walking, not elsewhere classified: Secondary | ICD-10-CM | POA: Diagnosis not present

## 2015-06-21 DIAGNOSIS — M6281 Muscle weakness (generalized): Secondary | ICD-10-CM | POA: Diagnosis not present

## 2015-06-23 DIAGNOSIS — R262 Difficulty in walking, not elsewhere classified: Secondary | ICD-10-CM | POA: Diagnosis not present

## 2015-06-23 DIAGNOSIS — M6281 Muscle weakness (generalized): Secondary | ICD-10-CM | POA: Diagnosis not present

## 2015-06-27 DIAGNOSIS — R262 Difficulty in walking, not elsewhere classified: Secondary | ICD-10-CM | POA: Diagnosis not present

## 2015-06-27 DIAGNOSIS — M6281 Muscle weakness (generalized): Secondary | ICD-10-CM | POA: Diagnosis not present

## 2015-06-28 DIAGNOSIS — R262 Difficulty in walking, not elsewhere classified: Secondary | ICD-10-CM | POA: Diagnosis not present

## 2015-06-28 DIAGNOSIS — M6281 Muscle weakness (generalized): Secondary | ICD-10-CM | POA: Diagnosis not present

## 2015-06-30 DIAGNOSIS — M6281 Muscle weakness (generalized): Secondary | ICD-10-CM | POA: Diagnosis not present

## 2015-06-30 DIAGNOSIS — R262 Difficulty in walking, not elsewhere classified: Secondary | ICD-10-CM | POA: Diagnosis not present

## 2015-07-03 DIAGNOSIS — M6281 Muscle weakness (generalized): Secondary | ICD-10-CM | POA: Diagnosis not present

## 2015-07-03 DIAGNOSIS — R262 Difficulty in walking, not elsewhere classified: Secondary | ICD-10-CM | POA: Diagnosis not present

## 2015-07-05 DIAGNOSIS — M6281 Muscle weakness (generalized): Secondary | ICD-10-CM | POA: Diagnosis not present

## 2015-07-05 DIAGNOSIS — R262 Difficulty in walking, not elsewhere classified: Secondary | ICD-10-CM | POA: Diagnosis not present

## 2015-07-07 DIAGNOSIS — R262 Difficulty in walking, not elsewhere classified: Secondary | ICD-10-CM | POA: Diagnosis not present

## 2015-07-07 DIAGNOSIS — M6281 Muscle weakness (generalized): Secondary | ICD-10-CM | POA: Diagnosis not present

## 2015-07-10 DIAGNOSIS — M6281 Muscle weakness (generalized): Secondary | ICD-10-CM | POA: Diagnosis not present

## 2015-07-10 DIAGNOSIS — R262 Difficulty in walking, not elsewhere classified: Secondary | ICD-10-CM | POA: Diagnosis not present

## 2015-07-11 DIAGNOSIS — R262 Difficulty in walking, not elsewhere classified: Secondary | ICD-10-CM | POA: Diagnosis not present

## 2015-07-11 DIAGNOSIS — M6281 Muscle weakness (generalized): Secondary | ICD-10-CM | POA: Diagnosis not present

## 2015-07-13 DIAGNOSIS — M6281 Muscle weakness (generalized): Secondary | ICD-10-CM | POA: Diagnosis not present

## 2015-07-13 DIAGNOSIS — R262 Difficulty in walking, not elsewhere classified: Secondary | ICD-10-CM | POA: Diagnosis not present

## 2015-07-14 ENCOUNTER — Ambulatory Visit: Payer: Medicare Other | Admitting: Internal Medicine

## 2015-07-26 ENCOUNTER — Ambulatory Visit (INDEPENDENT_AMBULATORY_CARE_PROVIDER_SITE_OTHER): Payer: Medicare Other | Admitting: Internal Medicine

## 2015-07-26 ENCOUNTER — Encounter: Payer: Self-pay | Admitting: Internal Medicine

## 2015-07-26 VITALS — BP 118/78 | HR 66 | Temp 98.1°F | Resp 20 | Ht 60.0 in | Wt 122.8 lb

## 2015-07-26 DIAGNOSIS — G309 Alzheimer's disease, unspecified: Secondary | ICD-10-CM | POA: Diagnosis not present

## 2015-07-26 DIAGNOSIS — F0150 Vascular dementia without behavioral disturbance: Secondary | ICD-10-CM | POA: Diagnosis not present

## 2015-07-26 DIAGNOSIS — R2681 Unsteadiness on feet: Secondary | ICD-10-CM

## 2015-07-26 DIAGNOSIS — F0280 Dementia in other diseases classified elsewhere without behavioral disturbance: Secondary | ICD-10-CM

## 2015-07-26 DIAGNOSIS — I10 Essential (primary) hypertension: Secondary | ICD-10-CM

## 2015-07-26 DIAGNOSIS — L409 Psoriasis, unspecified: Secondary | ICD-10-CM | POA: Diagnosis not present

## 2015-07-26 NOTE — Progress Notes (Signed)
Patient ID: Beverly Barnett, female   DOB: 04/01/1924, 82 y.o.   MRN: [id number]    Location:    [facility]   Place of Service:  OFFICE   Chief Complaint  Patient presents with  . Medical Management of Chronic Issues    2 month follow-up for med mgt.  . OTHER    Daughter in room with patient    HPI:  82 yo female seen today for f/u  Dementia is better on Aricept. BP at [place] at [facility] 110-130s/60-70s usually and occasionally SBP>150. She has been doing well with PT. No falls since last OV. She no longer leans to the right. She attends exercise daily.   She is a poor historian due to dementia. Hx obtained from daughter.  Past Medical History  Diagnosis Date  . Hypertension   . High cholesterol   . Osteoporosis   . Type II or unspecified type diabetes mellitus without mention of complication, not stated as uncontrolled   . Hyperosmolality and/or hypernatremia   . Dysphagia, unspecified(787.20)   . Other atopic dermatitis and related conditions   . Encounter for long-term (current) use of other medications   . Cardiac catheterisation as the cause of abnormal reaction of patient, or of later complication 55/97/4163  . Vascular dementia 06/22/2013    Past Surgical History  Procedure Laterality Date  . Femur surgery Right 03/2010  . Shoulder surgery Left 03/2010  . Femur im nail  07/24/2011    Procedure: INTRAMEDULLARY (IM) NAIL FEMORAL;  Surgeon: Johnn Hai, MD;  Location: [facility];  Service: Orthopedics;  Laterality: Right;  Biomet, c-arm, Jackson table  . Intramedullary (im) nail intertrochanteric Left 07/30/2013    Procedure: INTRAMEDULLARY (IM) NAIL INTERTROCHANTRIC LEFT;  Surgeon: Mauri Pole, MD;  Location: [facility];  Service: Orthopedics;  Laterality: Left;  . Fracture surgery Left     Hip, Leg and shoulder    Patient Care Team: Gildardo Cranker, DO as PCP - General (Internal Medicine)  Social History   Social History  . Marital Status: Widowed    Spouse Name: N/A   . Number of Children: N/A  . Years of Education: N/A   Occupational History  . Not on file.   Social History Main Topics  . Smoking status: Never Smoker   . Smokeless tobacco: Never Used  . Alcohol Use: No  . Drug Use: No  . Sexual Activity: Not on file   Other Topics Concern  . Not on file   Social History Narrative     reports that she has never smoked. She has never used smokeless tobacco. She reports that she does not drink alcohol or use illicit drugs.  No Known Allergies  Medications: Patient's Medications  New Prescriptions   No medications on file  Previous Medications   ACETAMINOPHEN (TYLENOL) 500 MG TABLET    Take 1 tablet (500 mg total) by mouth 3 (three) times daily. For one week, then tid prn thereafter   BENZONATATE (TESSALON) 100 MG CAPSULE    Take 1 capsule (100 mg total) by mouth every 8 (eight) hours.   COENZYME Q10 (CO Q 10) 100 MG CAPS    Take 1 tablet by mouth 2 (two) times daily.   COLESTIPOL (COLESTID) 1 G TABLET    Take 1 tablet (1 g total) by mouth daily.   DIPHENOXYLATE-ATROPINE (LOMOTIL) 2.5-0.025 MG PER TABLET    Take one tablet by mouth three times daily as needed for diarrhea (control)   DONEPEZIL (  ARICEPT) 10 MG TABLET    Take 10 mg by mouth at bedtime.   GUAIFENESIN (MUCINEX) 600 MG 12 HR TABLET    Take 1 tablet (600 mg total) by mouth 2 (two) times daily.   HYDROCORTISONE CREAM 0.5 %    Apply 1 application topically 2 (two) times daily.   LOSARTAN (COZAAR) 50 MG TABLET    Take 1.5 tablets (75 mg total) by mouth daily. for blood pressure   MELATONIN 3 MG CAPS    Take 1 capsule (3 mg total) by mouth at bedtime and may repeat dose one time if needed.   METOPROLOL (LOPRESSOR) 50 MG TABLET    Take 1 tablet (50 mg total) by mouth 2 (two) times daily.   MULTIPLE VITAMIN (MULITIVITAMIN WITH MINERALS) TABS    Take 1 tablet by mouth daily.    SACCHAROMYCES BOULARDII (FLORASTOR) 250 MG CAPSULE    Take 1 capsule (250 mg total) by mouth 2 (two) times  daily.   VAYACOG 100-19.5-6.5 MG CAPS    TAKE (1) CAPSULE DAILY.  Modified Medications   No medications on file  Discontinued Medications   No medications on file    Review of Systems  Unable to perform ROS: Dementia    Filed Vitals:   07/26/15 1625  Pulse: 66  Temp: 98.1 F (36.7 C)  TempSrc: Oral  Resp: 20  Height: 5' (1.524 m)  Weight: 122 lb 12.8 oz (55.702 kg)  SpO2: 98%   Body mass index is 23.98 kg/(m^2).  Physical Exam  Constitutional: She appears well-developed.  Frail appearing in NAD  HENT:  Mouth/Throat: Oropharynx is clear and moist. No oropharyngeal exudate.  Eyes: Pupils are equal, round, and reactive to light. No scleral icterus.  Neck: Neck supple. Carotid bruit is not present. No tracheal deviation present. No thyromegaly present.  Cardiovascular: Normal rate, regular rhythm, normal heart sounds and intact distal pulses.  Exam reveals no gallop and no friction rub.   No murmur heard. No LE edema b/l. no calf TTP.   Pulmonary/Chest: Effort normal and breath sounds normal. No stridor. No respiratory distress. She has no wheezes. She has no rales.  Abdominal: Soft. Bowel sounds are normal. She exhibits no distension and no mass. There is no hepatomegaly. There is no tenderness. There is no rebound and no guarding.  Musculoskeletal: She exhibits edema.  Lymphadenopathy:    She has no cervical adenopathy.  Neurological: She is alert.  Skin: Skin is warm and dry. No rash noted.  Psychiatric: She has a normal mood and affect. Her behavior is normal.     Labs reviewed: Admission on 06/10/2015, Discharged on 06/11/2015  Component Date Value Ref Range Status  . WBC 06/10/2015 9.1  4.0 - 10.5 K/uL Final  . RBC 06/10/2015 4.02  3.87 - 5.11 MIL/uL Final  . Hemoglobin 06/10/2015 12.2  12.0 - 15.0 g/dL Final  . HCT 06/10/2015 37.3  36.0 - 46.0 % Final  . MCV 06/10/2015 92.8  78.0 - 100.0 fL Final  . MCH 06/10/2015 30.3  26.0 - 34.0 pg Final  . MCHC 06/10/2015  32.7  30.0 - 36.0 g/dL Final  . RDW 06/10/2015 13.7  11.5 - 15.5 % Final  . Platelets 06/10/2015 182  150 - 400 K/uL Final  . Neutrophils Relative % 06/10/2015 59   Final  . Neutro Abs 06/10/2015 5.3  1.7 - 7.7 K/uL Final  . Lymphocytes Relative 06/10/2015 25   Final  . Lymphs Abs 06/10/2015 2.3  0.7 -  4.0 K/uL Final  . Monocytes Relative 06/10/2015 14   Final  . Monocytes Absolute 06/10/2015 1.3* 0.1 - 1.0 K/uL Final  . Eosinophils Relative 06/10/2015 2   Final  . Eosinophils Absolute 06/10/2015 0.2  0.0 - 0.7 K/uL Final  . Basophils Relative 06/10/2015 0   Final  . Basophils Absolute 06/10/2015 0.0  0.0 - 0.1 K/uL Final  . Sodium 06/10/2015 137  135 - 145 mmol/L Final  . Potassium 06/10/2015 4.3  3.5 - 5.1 mmol/L Final  . Chloride 06/10/2015 104  101 - 111 mmol/L Final  . CO2 06/10/2015 25  22 - 32 mmol/L Final  . Glucose, Bld 06/10/2015 119* 65 - 99 mg/dL Final  . BUN 06/10/2015 23* 6 - 20 mg/dL Final  . Creatinine, Ser 06/10/2015 0.74  0.44 - 1.00 mg/dL Final  . Calcium 06/10/2015 9.0  8.9 - 10.3 mg/dL Final  . Total Protein 06/10/2015 7.1  6.5 - 8.1 g/dL Final  . Albumin 06/10/2015 3.5  3.5 - 5.0 g/dL Final  . AST 06/10/2015 19  15 - 41 U/L Final  . ALT 06/10/2015 14  14 - 54 U/L Final  . Alkaline Phosphatase 06/10/2015 65  38 - 126 U/L Final  . Total Bilirubin 06/10/2015 0.5  0.3 - 1.2 mg/dL Final  . GFR calc non Af Amer 06/10/2015 >60  >60 mL/min Final  . GFR calc Af Amer 06/10/2015 >60  >60 mL/min Final   Comment: (NOTE) The eGFR has been calculated using the CKD EPI equation. This calculation has not been validated in all clinical situations. eGFR's persistently <60 mL/min signify possible Chronic Kidney Disease.   . Anion gap 06/10/2015 8  5 - 15 Final  . Color, Urine 06/10/2015 YELLOW  YELLOW Final  . APPearance 06/10/2015 CLOUDY* CLEAR Final  . Specific Gravity, Urine 06/10/2015 1.017  1.005 - 1.030 Final  . pH 06/10/2015 6.0  5.0 - 8.0 Final  . Glucose, UA  06/10/2015 NEGATIVE  NEGATIVE mg/dL Final  . Hgb urine dipstick 06/10/2015 NEGATIVE  NEGATIVE Final  . Bilirubin Urine 06/10/2015 NEGATIVE  NEGATIVE Final  . Ketones, ur 06/10/2015 NEGATIVE  NEGATIVE mg/dL Final  . Protein, ur 06/10/2015 NEGATIVE  NEGATIVE mg/dL Final  . Nitrite 06/10/2015 NEGATIVE  NEGATIVE Final  . Leukocytes, UA 06/10/2015 MODERATE* NEGATIVE Final  . Squamous Epithelial / LPF 06/10/2015 0-5* NONE SEEN Final  . WBC, UA 06/10/2015 TOO NUMEROUS TO COUNT  0 - 5 WBC/hpf Final  . RBC / HPF 06/10/2015 0-5  0 - 5 RBC/hpf Final  . Bacteria, UA 06/10/2015 MANY* NONE SEEN Final  . Specimen Description 06/10/2015 URINE, CLEAN CATCH   Final  . Special Requests 06/10/2015 NONE   Final  . Culture 06/10/2015    Final                   Value:>=100,000 COLONIES/mL KLEBSIELLA PNEUMONIAE Performed at [facility]   . Report Status 06/10/2015 06/13/2015 FINAL   Final  . Organism ID, Bacteria 06/10/2015 KLEBSIELLA PNEUMONIAE   Final    No results found.   Assessment/Plan   ICD-9-CM ICD-10-CM   1. Essential hypertension 401.9 I10   2. Mixed Alzheimer's and vascular dementia 331.0 G30.9    294.10 F01.50    290.40 F02.80   3. Psoriasis 696.1 L40.9   4. Unsteady gait 781.2 R26.81    Start triamcinolone cream 0.1% to apply a small amount to rash on face and rub in gently daily as needed  STOP hydrocortisone  cream  Continue PT as ordered  Continue daily BP check due to fluctuating readings. Fax results monthly to office  Continue other medications as ordered  Follow up in 3 months for routine visit   Beverly Barnett S. Beverly Barnett  [facility] [address] [place], [place] [address] [phone] Cell (Monday-Friday 8 AM - 5 PM) [phone] After 5 PM and follow prompts

## 2015-07-26 NOTE — Patient Instructions (Addendum)
Start triamcinolone cream 0.1% to apply a small amount to rash on face and rub in gently daily as needed  STOP hydrocortisone cream  Continue PT as ordered  Continue daily BP check due to fluctuating readings. Fax results monthly to office  Continue other medications as ordered  Follow up in 3 months for routine visit

## 2015-09-29 ENCOUNTER — Telehealth: Payer: Self-pay

## 2015-09-29 NOTE — Telephone Encounter (Signed)
Called patient, LMOVM for daughter to call back and schedule a three month follow up with Dr. Montez Moritaarter. Patient last seen 07/26/15 and was advised to return back in three months.

## 2015-10-16 ENCOUNTER — Other Ambulatory Visit: Payer: Self-pay | Admitting: Internal Medicine

## 2015-11-08 ENCOUNTER — Encounter: Payer: Self-pay | Admitting: Internal Medicine

## 2015-11-08 ENCOUNTER — Ambulatory Visit: Payer: Medicare Other | Admitting: Internal Medicine

## 2015-11-16 ENCOUNTER — Other Ambulatory Visit: Payer: Self-pay | Admitting: Internal Medicine

## 2015-11-29 ENCOUNTER — Encounter: Payer: Self-pay | Admitting: Internal Medicine

## 2015-11-29 ENCOUNTER — Ambulatory Visit: Payer: Medicare Other | Admitting: Internal Medicine

## 2015-11-29 ENCOUNTER — Ambulatory Visit (INDEPENDENT_AMBULATORY_CARE_PROVIDER_SITE_OTHER): Payer: Medicare Other | Admitting: Internal Medicine

## 2015-11-29 VITALS — BP 130/88 | HR 60 | Temp 98.2°F | Resp 20 | Ht 60.0 in | Wt 123.6 lb

## 2015-11-29 DIAGNOSIS — F0150 Vascular dementia without behavioral disturbance: Secondary | ICD-10-CM

## 2015-11-29 DIAGNOSIS — I10 Essential (primary) hypertension: Secondary | ICD-10-CM

## 2015-11-29 DIAGNOSIS — G8194 Hemiplegia, unspecified affecting left nondominant side: Secondary | ICD-10-CM

## 2015-11-29 DIAGNOSIS — G309 Alzheimer's disease, unspecified: Secondary | ICD-10-CM | POA: Diagnosis not present

## 2015-11-29 DIAGNOSIS — F0280 Dementia in other diseases classified elsewhere without behavioral disturbance: Secondary | ICD-10-CM | POA: Diagnosis not present

## 2015-11-29 DIAGNOSIS — R2681 Unsteadiness on feet: Secondary | ICD-10-CM

## 2015-11-29 DIAGNOSIS — L409 Psoriasis, unspecified: Secondary | ICD-10-CM | POA: Diagnosis not present

## 2015-11-29 DIAGNOSIS — E785 Hyperlipidemia, unspecified: Secondary | ICD-10-CM

## 2015-11-29 DIAGNOSIS — R296 Repeated falls: Secondary | ICD-10-CM | POA: Diagnosis not present

## 2015-11-29 NOTE — Patient Instructions (Signed)
Continue current medications as ordered  Fall precautions  Will call with MRI appt  Will call with lab results  May need neurology referral  Follow up in 3 mos for routine visit.

## 2015-11-29 NOTE — Progress Notes (Signed)
Patient ID: Beverly Barnett, female   DOB: Jun 14, 1923, 82 y.o.   MRN: [id number]    Location:  [facility] Place of Service: OFFICE  Chief Complaint  Patient presents with  . Medical Management of Chronic Issues    fell out of bed 2 days ago will going to bathroom    HPI:  82 yo female seen today for f/u. She c/o worsening grip strength in left hand and dropping objects. No pain. No numbness or tingling. No recent trauma. She fell OOB 2 days ago while trying to go to the restroom. Per daughter, she is getting slower and looking to the side. Gait more unsteady. She is a poor historian due to dementia. Hx obtained from daughter  Dementia - better on Aricept. She takes melatonin to help sleep. She takes vayacog daily. Weight is stable and has gained 1 lb since last OV  HTN - BP at [place] at [facility] 130-140s/60-70s usually and occasionally SBP>150. She takes losartan and metoprolol  Unsteady gait - She has been doing well with PT. She no longer leans to the right. She attends exercise daily  Hyperlipidemia - stable on colestipol. LDL 106; Total chol 224; TG 385; HDL 41  Rash - unchanged on triamcinolone cream and hydrocortisone cream  Constipation - stable on floraster  Past Medical History  Diagnosis Date  . Hypertension   . High cholesterol   . Osteoporosis   . Type II or unspecified type diabetes mellitus without mention of complication, not stated as uncontrolled   . Hyperosmolality and/or hypernatremia   . Dysphagia, unspecified(787.20)   . Other atopic dermatitis and related conditions   . Encounter for long-term (current) use of other medications   . Cardiac catheterisation as the cause of abnormal reaction of patient, or of later complication 85/27/7824  . Vascular dementia 06/22/2013    Past Surgical History  Procedure Laterality Date  . Femur surgery Right 03/2010  . Shoulder surgery Left 03/2010  . Femur im nail  07/24/2011    Procedure: INTRAMEDULLARY (IM) NAIL  FEMORAL;  Surgeon: Johnn Hai, MD;  Location: [facility];  Service: Orthopedics;  Laterality: Right;  Biomet, c-arm, Jackson table  . Intramedullary (im) nail intertrochanteric Left 07/30/2013    Procedure: INTRAMEDULLARY (IM) NAIL INTERTROCHANTRIC LEFT;  Surgeon: Mauri Pole, MD;  Location: [facility];  Service: Orthopedics;  Laterality: Left;  . Fracture surgery Left     Hip, Leg and shoulder    Patient Care Team: Gildardo Cranker, DO as PCP - General (Internal Medicine)  Social History   Social History  . Marital Status: Widowed    Spouse Name: N/A  . Number of Children: N/A  . Years of Education: N/A   Occupational History  . Not on file.   Social History Main Topics  . Smoking status: Never Smoker   . Smokeless tobacco: Never Used  . Alcohol Use: No  . Drug Use: No  . Sexual Activity: Not on file   Other Topics Concern  . Not on file   Social History Narrative     reports that she has never smoked. She has never used smokeless tobacco. She reports that she does not drink alcohol or use illicit drugs.  Family History  Problem Relation Age of Onset  . Cancer Sister   . Heart disease Father    Family Status  Relation Status Death Age  . Sister Deceased   . Mother Deceased 48  . Father Deceased 53  . Sister  Deceased   . Brother Deceased   . Daughter Alive   . Son Alive   . Brother Alive   . Son Alive      No Known Allergies  Medications: Patient's Medications  New Prescriptions   No medications on file  Previous Medications   ACETAMINOPHEN (TYLENOL) 500 MG TABLET    Take 1 tablet (500 mg total) by mouth 3 (three) times daily. For one week, then tid prn thereafter   BENZONATATE (TESSALON) 100 MG CAPSULE    Take 1 capsule (100 mg total) by mouth every 8 (eight) hours.   COENZYME Q10 (CO Q 10) 100 MG CAPS    Take 1 tablet by mouth 2 (two) times daily.   COLESTIPOL (COLESTID) 1 G TABLET    Take 1 tablet (1 g total) by mouth daily.   DIPHENOXYLATE-ATROPINE  (LOMOTIL) 2.5-0.025 MG PER TABLET    Take one tablet by mouth three times daily as needed for diarrhea (control)   DONEPEZIL (ARICEPT) 10 MG TABLET    Take 10 mg by mouth at bedtime.   HYDROCORTISONE CREAM 0.5 %    Apply 1 application topically 2 (two) times daily.   LOSARTAN (COZAAR) 50 MG TABLET    Take 1.5 tablets (75 mg total) by mouth daily. for blood pressure   MELATONIN 3 MG CAPS    Take 1 capsule (3 mg total) by mouth at bedtime and may repeat dose one time if needed.   METOPROLOL (LOPRESSOR) 50 MG TABLET    Take 1 tablet (50 mg total) by mouth 2 (two) times daily.   MULTIPLE VITAMIN (MULITIVITAMIN WITH MINERALS) TABS    Take 1 tablet by mouth daily.    SACCHAROMYCES BOULARDII (FLORASTOR) 250 MG CAPSULE    Take 1 capsule (250 mg total) by mouth 2 (two) times daily.   TRIAMCINOLONE ACETONIDE (TRIAMCINOLONE 0.1 % CREAM : EUCERIN) CREA    Apply 1 application topically daily as needed for rash (on face).   VAYACOG 100-19.5-6.5 MG CAPS    TAKE 1 CAPSULE EVERY DAY.  Modified Medications   No medications on file  Discontinued Medications   GUAIFENESIN (MUCINEX) 600 MG 12 HR TABLET    Take 1 tablet (600 mg total) by mouth 2 (two) times daily.    Review of Systems  Unable to perform ROS: Dementia    Filed Vitals:   11/29/15 1441  BP: 130/88  Pulse: 60  Temp: 98.2 F (36.8 C)  TempSrc: Oral  Resp: 20  Height: 5' (1.524 m)  Weight: 123 lb 9.6 oz (56.065 kg)   Body mass index is 24.14 kg/(m^2).  Physical Exam  Constitutional: She appears well-developed.  Frail appearing in NAD  HENT:  Mouth/Throat: Oropharynx is clear and moist. No oropharyngeal exudate.  Eyes: Pupils are equal, round, and reactive to light. No scleral icterus.  Neck: Neck supple. Carotid bruit is not present. No tracheal deviation present. No thyromegaly present.  Cardiovascular: Normal rate, regular rhythm, normal heart sounds and intact distal pulses.  Exam reveals no gallop and no friction rub.   No murmur  heard. No LE edema b/l. no calf TTP.   Pulmonary/Chest: Effort normal and breath sounds normal. No stridor. No respiratory distress. She has no wheezes. She has no rales.  Abdominal: Soft. Bowel sounds are normal. She exhibits no distension and no mass. There is no hepatomegaly. There is no tenderness. There is no rebound and no guarding.  Musculoskeletal: She exhibits edema.  Thoracic kyphoscoliosis; poor posture  Lymphadenopathy:  She has no cervical adenopathy.  Neurological: She is alert. Gait (slow and shuffling) abnormal.  Reduced CN 12 but other CN grossly intact; grip strength 4/5 on left; strength 3/5 R>LLE; speech slurred  Skin: Skin is warm and dry. No rash noted.  Psychiatric: She has a normal mood and affect. Her behavior is normal. Her speech is delayed and slurred. Cognition and memory are impaired.     Labs reviewed: No visits with results within 3 Month(s) from this visit. Latest known visit with results is:  Admission on 06/10/2015, Discharged on 06/11/2015  Component Date Value Ref Range Status  . WBC 06/10/2015 9.1  4.0 - 10.5 K/uL Final  . RBC 06/10/2015 4.02  3.87 - 5.11 MIL/uL Final  . Hemoglobin 06/10/2015 12.2  12.0 - 15.0 g/dL Final  . HCT 06/10/2015 37.3  36.0 - 46.0 % Final  . MCV 06/10/2015 92.8  78.0 - 100.0 fL Final  . MCH 06/10/2015 30.3  26.0 - 34.0 pg Final  . MCHC 06/10/2015 32.7  30.0 - 36.0 g/dL Final  . RDW 06/10/2015 13.7  11.5 - 15.5 % Final  . Platelets 06/10/2015 182  150 - 400 K/uL Final  . Neutrophils Relative % 06/10/2015 59   Final  . Neutro Abs 06/10/2015 5.3  1.7 - 7.7 K/uL Final  . Lymphocytes Relative 06/10/2015 25   Final  . Lymphs Abs 06/10/2015 2.3  0.7 - 4.0 K/uL Final  . Monocytes Relative 06/10/2015 14   Final  . Monocytes Absolute 06/10/2015 1.3* 0.1 - 1.0 K/uL Final  . Eosinophils Relative 06/10/2015 2   Final  . Eosinophils Absolute 06/10/2015 0.2  0.0 - 0.7 K/uL Final  . Basophils Relative 06/10/2015 0   Final  .  Basophils Absolute 06/10/2015 0.0  0.0 - 0.1 K/uL Final  . Sodium 06/10/2015 137  135 - 145 mmol/L Final  . Potassium 06/10/2015 4.3  3.5 - 5.1 mmol/L Final  . Chloride 06/10/2015 104  101 - 111 mmol/L Final  . CO2 06/10/2015 25  22 - 32 mmol/L Final  . Glucose, Bld 06/10/2015 119* 65 - 99 mg/dL Final  . BUN 06/10/2015 23* 6 - 20 mg/dL Final  . Creatinine, Ser 06/10/2015 0.74  0.44 - 1.00 mg/dL Final  . Calcium 06/10/2015 9.0  8.9 - 10.3 mg/dL Final  . Total Protein 06/10/2015 7.1  6.5 - 8.1 g/dL Final  . Albumin 06/10/2015 3.5  3.5 - 5.0 g/dL Final  . AST 06/10/2015 19  15 - 41 U/L Final  . ALT 06/10/2015 14  14 - 54 U/L Final  . Alkaline Phosphatase 06/10/2015 65  38 - 126 U/L Final  . Total Bilirubin 06/10/2015 0.5  0.3 - 1.2 mg/dL Final  . GFR calc non Af Amer 06/10/2015 >60  >60 mL/min Final  . GFR calc Af Amer 06/10/2015 >60  >60 mL/min Final   Comment: (NOTE) The eGFR has been calculated using the CKD EPI equation. This calculation has not been validated in all clinical situations. eGFR's persistently <60 mL/min signify possible Chronic Kidney Disease.   . Anion gap 06/10/2015 8  5 - 15 Final  . Color, Urine 06/10/2015 YELLOW  YELLOW Final  . APPearance 06/10/2015 CLOUDY* CLEAR Final  . Specific Gravity, Urine 06/10/2015 1.017  1.005 - 1.030 Final  . pH 06/10/2015 6.0  5.0 - 8.0 Final  . Glucose, UA 06/10/2015 NEGATIVE  NEGATIVE mg/dL Final  . Hgb urine dipstick 06/10/2015 NEGATIVE  NEGATIVE Final  . Bilirubin Urine 06/10/2015 NEGATIVE  NEGATIVE Final  . Ketones, ur 06/10/2015 NEGATIVE  NEGATIVE mg/dL Final  . Protein, ur 06/10/2015 NEGATIVE  NEGATIVE mg/dL Final  . Nitrite 06/10/2015 NEGATIVE  NEGATIVE Final  . Leukocytes, UA 06/10/2015 MODERATE* NEGATIVE Final  . Squamous Epithelial / LPF 06/10/2015 0-5* NONE SEEN Final  . WBC, UA 06/10/2015 TOO NUMEROUS TO COUNT  0 - 5 WBC/hpf Final  . RBC / HPF 06/10/2015 0-5  0 - 5 RBC/hpf Final  . Bacteria, UA 06/10/2015 MANY* NONE  SEEN Final  . Specimen Description 06/10/2015 URINE, CLEAN CATCH   Final  . Special Requests 06/10/2015 NONE   Final  . Culture 06/10/2015    Final                   Value:>=100,000 COLONIES/mL KLEBSIELLA PNEUMONIAE Performed at [facility]   . Report Status 06/10/2015 06/13/2015 FINAL   Final  . Organism ID, Bacteria 06/10/2015 KLEBSIELLA PNEUMONIAE   Final    No results found.   Assessment/Plan   ICD-9-CM ICD-10-CM   1. Hemiparesis, left (HCC) 342.90 G81.94 MR Brain W Wo Contrast     MR MRA Head/Brain Wo Cm     CMP     CBC with Differential/Platelets   new  2. Unsteady gait 781.2 R26.81 MR Brain W Wo Contrast     MR MRA Head/Brain Wo Cm     CMP     CBC with Differential/Platelets  3. Mixed Alzheimer's and vascular dementia 331.0 G30.9 MR Brain W Wo Contrast   294.10 F01.50 MR MRA Head/Brain Wo Cm   290.40 F02.80   4. Essential hypertension, benign 401.1 I10 Urinalysis with Reflex Microscopic  5. Frequent falls V15.88 R29.6 MR Brain W Wo Contrast     MR MRA Head/Brain Wo Cm     CBC with Differential/Platelets  6. Hyperlipidemia with target LDL less than 130 272.4 E78.5 Lipid Panel  7. Psoriasis 696.1 L40.9    Continue current medications as ordered  Fall precautions  Will call with MRI appt  Will call with lab results  May need neurology referral  Follow up in 3 mos for routine visit.  Florie Carico S. Perlie Gold  [facility] [address] [place], [place] [address] [phone] Cell (Monday-Friday 8 AM - 5 PM) [phone] After 5 PM and follow prompts

## 2015-11-30 LAB — CBC WITH DIFFERENTIAL/PLATELET
BASOS ABS: 0 10*3/uL (ref 0.0–0.2)
Basos: 0 %
EOS (ABSOLUTE): 0.3 10*3/uL (ref 0.0–0.4)
Eos: 4 %
HEMOGLOBIN: 14.3 g/dL (ref 11.1–15.9)
Hematocrit: 43.2 % (ref 34.0–46.6)
Immature Grans (Abs): 0 10*3/uL (ref 0.0–0.1)
Immature Granulocytes: 0 %
LYMPHS ABS: 3.8 10*3/uL — AB (ref 0.7–3.1)
Lymphs: 44 %
MCH: 31 pg (ref 26.6–33.0)
MCHC: 33.1 g/dL (ref 31.5–35.7)
MCV: 94 fL (ref 79–97)
Monocytes Absolute: 0.9 10*3/uL (ref 0.1–0.9)
Monocytes: 10 %
NEUTROS ABS: 3.6 10*3/uL (ref 1.4–7.0)
Neutrophils: 42 %
PLATELETS: 212 10*3/uL (ref 150–379)
RBC: 4.62 x10E6/uL (ref 3.77–5.28)
RDW: 13.9 % (ref 12.3–15.4)
WBC: 8.7 10*3/uL (ref 3.4–10.8)

## 2015-11-30 LAB — URINALYSIS, ROUTINE W REFLEX MICROSCOPIC
Bilirubin, UA: NEGATIVE
Glucose, UA: NEGATIVE
KETONES UA: NEGATIVE
Nitrite, UA: POSITIVE — AB
Protein, UA: NEGATIVE
RBC, UA: NEGATIVE
SPEC GRAV UA: 1.02 (ref 1.005–1.030)
Urobilinogen, Ur: 0.2 mg/dL (ref 0.2–1.0)
pH, UA: 7 (ref 5.0–7.5)

## 2015-11-30 LAB — COMPREHENSIVE METABOLIC PANEL
ALT: 13 IU/L (ref 0–32)
AST: 20 IU/L (ref 0–40)
Albumin/Globulin Ratio: 1.3 (ref 1.2–2.2)
Albumin: 4.3 g/dL (ref 3.5–4.7)
Alkaline Phosphatase: 73 IU/L (ref 39–117)
BUN/Creatinine Ratio: 23 (ref 12–28)
BUN: 23 mg/dL (ref 8–27)
Bilirubin Total: 0.5 mg/dL (ref 0.0–1.2)
CALCIUM: 9.9 mg/dL (ref 8.7–10.3)
CO2: 25 mmol/L (ref 18–29)
CREATININE: 1.02 mg/dL — AB (ref 0.57–1.00)
Chloride: 100 mmol/L (ref 96–106)
GFR calc Af Amer: 56 mL/min/{1.73_m2} — ABNORMAL LOW (ref >59)
GFR, EST NON AFRICAN AMERICAN: 49 mL/min/{1.73_m2} — AB (ref >59)
GLOBULIN, TOTAL: 3.4 g/dL (ref 1.5–4.5)
Glucose: 102 mg/dL — ABNORMAL HIGH (ref 65–99)
Potassium: 4.6 mmol/L (ref 3.5–5.2)
Sodium: 142 mmol/L (ref 134–144)
TOTAL PROTEIN: 7.7 g/dL (ref 6.0–8.5)

## 2015-11-30 LAB — MICROSCOPIC EXAMINATION
Casts: NONE SEEN /lpf
WBC, UA: >30 /hpf — AB (ref 0–?)

## 2015-11-30 LAB — LIPID PANEL
CHOL/HDL RATIO: 4.7 ratio — AB (ref 0.0–4.4)
Cholesterol, Total: 235 mg/dL — ABNORMAL HIGH (ref 100–199)
HDL: 50 mg/dL (ref >39)
LDL CALC: 131 mg/dL — AB (ref 0–99)
TRIGLYCERIDES: 270 mg/dL — AB (ref 0–149)
VLDL CHOLESTEROL CAL: 54 mg/dL — AB (ref 5–40)

## 2015-12-01 ENCOUNTER — Other Ambulatory Visit: Payer: Self-pay

## 2015-12-01 MED ORDER — CIPROFLOXACIN HCL 250 MG PO TABS: 250.0000 mg | ORAL_TABLET | Freq: Two times a day (BID) | ORAL | Status: DC

## 2015-12-01 NOTE — Addendum Note (Signed)
Addended by: Chriss DriverLANE, Tempestt Silba L on: 12/01/2015 08:04 AM   Modules accepted: Orders, Medications

## 2015-12-07 ENCOUNTER — Ambulatory Visit
Admission: RE | Admit: 2015-12-07 | Discharge: 2015-12-07 | Disposition: A | Discharge status: Home / Self Care | Payer: Medicare Other | Source: Ambulatory Visit | Attending: Internal Medicine | Admitting: Internal Medicine

## 2015-12-07 DIAGNOSIS — F0280 Dementia in other diseases classified elsewhere without behavioral disturbance: Secondary | ICD-10-CM

## 2015-12-07 DIAGNOSIS — G309 Alzheimer's disease, unspecified: Secondary | ICD-10-CM | POA: Diagnosis not present

## 2015-12-07 DIAGNOSIS — R296 Repeated falls: Secondary | ICD-10-CM

## 2015-12-07 DIAGNOSIS — R2681 Unsteadiness on feet: Secondary | ICD-10-CM

## 2015-12-07 DIAGNOSIS — F0150 Vascular dementia without behavioral disturbance: Secondary | ICD-10-CM

## 2015-12-07 DIAGNOSIS — G8194 Hemiplegia, unspecified affecting left nondominant side: Secondary | ICD-10-CM

## 2015-12-07 DIAGNOSIS — R4781 Slurred speech: Secondary | ICD-10-CM | POA: Diagnosis not present

## 2015-12-07 MED ORDER — GADOBENATE DIMEGLUMINE 529 MG/ML IV SOLN
10.0000 mL | Freq: Once | INTRAVENOUS | Status: AC | PRN
  Administered 2015-12-07: 10 mL via INTRAVENOUS

## 2015-12-08 ENCOUNTER — Other Ambulatory Visit: Payer: Self-pay

## 2015-12-08 ENCOUNTER — Telehealth: Payer: Self-pay | Admitting: *Deleted

## 2015-12-08 DIAGNOSIS — G919 Hydrocephalus, unspecified: Secondary | ICD-10-CM

## 2015-12-08 DIAGNOSIS — G309 Alzheimer's disease, unspecified: Secondary | ICD-10-CM

## 2015-12-08 DIAGNOSIS — F0280 Dementia in other diseases classified elsewhere without behavioral disturbance: Secondary | ICD-10-CM

## 2015-12-08 DIAGNOSIS — F0150 Vascular dementia without behavioral disturbance: Secondary | ICD-10-CM

## 2015-12-08 DIAGNOSIS — R9089 Other abnormal findings on diagnostic imaging of central nervous system: Secondary | ICD-10-CM

## 2015-12-08 DIAGNOSIS — R2681 Unsteadiness on feet: Secondary | ICD-10-CM

## 2015-12-08 NOTE — Telephone Encounter (Signed)
Received FL2 form from [place] at [facility] stating it is state regulations that a FL2 form is signed annually. Given to Dr. Montez Moritaarter to review and sign

## 2015-12-13 ENCOUNTER — Other Ambulatory Visit: Payer: Self-pay | Admitting: Internal Medicine

## 2015-12-16 DIAGNOSIS — F0150 Vascular dementia without behavioral disturbance: Secondary | ICD-10-CM | POA: Diagnosis not present

## 2015-12-16 DIAGNOSIS — E785 Hyperlipidemia, unspecified: Secondary | ICD-10-CM | POA: Diagnosis not present

## 2015-12-16 DIAGNOSIS — G309 Alzheimer's disease, unspecified: Secondary | ICD-10-CM | POA: Diagnosis not present

## 2015-12-16 DIAGNOSIS — G8194 Hemiplegia, unspecified affecting left nondominant side: Secondary | ICD-10-CM | POA: Diagnosis not present

## 2015-12-16 DIAGNOSIS — F0280 Dementia in other diseases classified elsewhere without behavioral disturbance: Secondary | ICD-10-CM | POA: Diagnosis not present

## 2015-12-16 DIAGNOSIS — Z9181 History of falling: Secondary | ICD-10-CM | POA: Diagnosis not present

## 2015-12-16 DIAGNOSIS — G919 Hydrocephalus, unspecified: Secondary | ICD-10-CM | POA: Diagnosis not present

## 2015-12-16 DIAGNOSIS — I10 Essential (primary) hypertension: Secondary | ICD-10-CM | POA: Diagnosis not present

## 2015-12-18 ENCOUNTER — Telehealth: Payer: Self-pay | Admitting: *Deleted

## 2015-12-18 DIAGNOSIS — Z9181 History of falling: Secondary | ICD-10-CM | POA: Diagnosis not present

## 2015-12-18 DIAGNOSIS — G8194 Hemiplegia, unspecified affecting left nondominant side: Secondary | ICD-10-CM | POA: Diagnosis not present

## 2015-12-18 DIAGNOSIS — G309 Alzheimer's disease, unspecified: Secondary | ICD-10-CM | POA: Diagnosis not present

## 2015-12-18 DIAGNOSIS — I10 Essential (primary) hypertension: Secondary | ICD-10-CM | POA: Diagnosis not present

## 2015-12-18 DIAGNOSIS — G919 Hydrocephalus, unspecified: Secondary | ICD-10-CM | POA: Diagnosis not present

## 2015-12-18 DIAGNOSIS — E785 Hyperlipidemia, unspecified: Secondary | ICD-10-CM | POA: Diagnosis not present

## 2015-12-18 NOTE — Telephone Encounter (Signed)
[facility] called and requested verbal orders for PT 1x1week, 3x3weeks and 2x2weeks.  Verbal orders given.

## 2015-12-19 DIAGNOSIS — G919 Hydrocephalus, unspecified: Secondary | ICD-10-CM | POA: Diagnosis not present

## 2015-12-19 DIAGNOSIS — Z9181 History of falling: Secondary | ICD-10-CM | POA: Diagnosis not present

## 2015-12-19 DIAGNOSIS — G309 Alzheimer's disease, unspecified: Secondary | ICD-10-CM | POA: Diagnosis not present

## 2015-12-19 DIAGNOSIS — I10 Essential (primary) hypertension: Secondary | ICD-10-CM | POA: Diagnosis not present

## 2015-12-19 DIAGNOSIS — G8194 Hemiplegia, unspecified affecting left nondominant side: Secondary | ICD-10-CM | POA: Diagnosis not present

## 2015-12-19 DIAGNOSIS — E785 Hyperlipidemia, unspecified: Secondary | ICD-10-CM | POA: Diagnosis not present

## 2015-12-21 DIAGNOSIS — G309 Alzheimer's disease, unspecified: Secondary | ICD-10-CM | POA: Diagnosis not present

## 2015-12-21 DIAGNOSIS — G919 Hydrocephalus, unspecified: Secondary | ICD-10-CM | POA: Diagnosis not present

## 2015-12-21 DIAGNOSIS — Z9181 History of falling: Secondary | ICD-10-CM | POA: Diagnosis not present

## 2015-12-21 DIAGNOSIS — E785 Hyperlipidemia, unspecified: Secondary | ICD-10-CM | POA: Diagnosis not present

## 2015-12-21 DIAGNOSIS — G8194 Hemiplegia, unspecified affecting left nondominant side: Secondary | ICD-10-CM | POA: Diagnosis not present

## 2015-12-21 DIAGNOSIS — I10 Essential (primary) hypertension: Secondary | ICD-10-CM | POA: Diagnosis not present

## 2015-12-22 DIAGNOSIS — Z9181 History of falling: Secondary | ICD-10-CM | POA: Diagnosis not present

## 2015-12-22 DIAGNOSIS — G8194 Hemiplegia, unspecified affecting left nondominant side: Secondary | ICD-10-CM | POA: Diagnosis not present

## 2015-12-22 DIAGNOSIS — G919 Hydrocephalus, unspecified: Secondary | ICD-10-CM | POA: Diagnosis not present

## 2015-12-22 DIAGNOSIS — G309 Alzheimer's disease, unspecified: Secondary | ICD-10-CM | POA: Diagnosis not present

## 2015-12-22 DIAGNOSIS — I10 Essential (primary) hypertension: Secondary | ICD-10-CM | POA: Diagnosis not present

## 2015-12-22 DIAGNOSIS — E785 Hyperlipidemia, unspecified: Secondary | ICD-10-CM | POA: Diagnosis not present

## 2015-12-25 ENCOUNTER — Ambulatory Visit (INDEPENDENT_AMBULATORY_CARE_PROVIDER_SITE_OTHER): Payer: Medicare Other | Admitting: Neurology

## 2015-12-25 ENCOUNTER — Encounter: Payer: Self-pay | Admitting: Neurology

## 2015-12-25 VITALS — BP 148/62 | HR 61 | Ht 60.0 in | Wt 123.0 lb

## 2015-12-25 DIAGNOSIS — R9089 Other abnormal findings on diagnostic imaging of central nervous system: Secondary | ICD-10-CM

## 2015-12-25 DIAGNOSIS — G911 Obstructive hydrocephalus: Secondary | ICD-10-CM | POA: Diagnosis not present

## 2015-12-25 DIAGNOSIS — I10 Essential (primary) hypertension: Secondary | ICD-10-CM | POA: Diagnosis not present

## 2015-12-25 DIAGNOSIS — R930 Abnormal findings on diagnostic imaging of skull and head, not elsewhere classified: Secondary | ICD-10-CM

## 2015-12-25 DIAGNOSIS — G8194 Hemiplegia, unspecified affecting left nondominant side: Secondary | ICD-10-CM | POA: Insufficient documentation

## 2015-12-25 DIAGNOSIS — Z9181 History of falling: Secondary | ICD-10-CM | POA: Diagnosis not present

## 2015-12-25 DIAGNOSIS — G309 Alzheimer's disease, unspecified: Secondary | ICD-10-CM | POA: Diagnosis not present

## 2015-12-25 DIAGNOSIS — G919 Hydrocephalus, unspecified: Secondary | ICD-10-CM | POA: Diagnosis not present

## 2015-12-25 DIAGNOSIS — E785 Hyperlipidemia, unspecified: Secondary | ICD-10-CM | POA: Diagnosis not present

## 2015-12-25 DIAGNOSIS — G918 Other hydrocephalus: Secondary | ICD-10-CM | POA: Insufficient documentation

## 2015-12-25 MED ORDER — ASPIRIN EC 81 MG PO TBEC: 81.0000 mg | DELAYED_RELEASE_TABLET | Freq: Every day | ORAL | Status: DC

## 2015-12-25 NOTE — Progress Notes (Signed)
[facility] [address] [place]. [place] [address] [phone]       OFFICE CONSULT NOTE  Beverly Barnett Date of Birth:  12/14/1923 Medical Record Number:  [id number]   Referring MD:  [name]  Reason for Referral:  Abnormal brain MRI scan  HPI: Ms Lyn is a 77 year pleasant Caucasian lady who is accompanied today by her son-in-law for today's visit who provides most of the history. Patient has history of mild dementia diagnosed a year ago by the primary physician and has been on Aricept 10 mg daily which has kept her fairly stable. Since the last couple of months patient has noticed difficulty using her left side. She feels as if her coordination and strength is affected. She's also been walking by dragging the left leg. Patient and family member deny any history of stroke. She is currently living in assisted living and has been getting physical and occupational therapy twice per week. Patient has never seen a neurologist for her dementia. She underwent MRI scan of the brain done on 12/07/15 which have personally reviewed the films show moderate changes of chronic microvascular ischemia and ventricular dilatation along with generalized cerebral atrophy. Question of normal pressure hydrocephalus has been raised by the radiologist but compared with the previous MRI from 09/04/2014 I see no changes and compared with the previous CT scan of the head from 03/03/2010 which had already shown some atrophy there is progression which is expected over time. The patient denies any urinary incontinence, gait or balance problems of frequent falls or sudden worsening of her cognition. The patient has not been on a higher dose of Aricept before or tried Exelon or other medications or had seen any neurologist for her dementia ROS:   14 system review of systems is positive for  memory loss, balance difficulty, walking difficulty, bladder infection, incontinence, left hand weakness and all  other systems negative  PMH:  Past Medical History  Diagnosis Date  . Hypertension   . High cholesterol   . Osteoporosis   . Type II or unspecified type diabetes mellitus without mention of complication, not stated as uncontrolled   . Hyperosmolality and/or hypernatremia   . Dysphagia, unspecified(787.20)   . Other atopic dermatitis and related conditions   . Encounter for long-term (current) use of other medications   . Cardiac catheterisation as the cause of abnormal reaction of patient, or of later complication 10/05/2006  . Vascular dementia 06/22/2013    Social History:  Social History   Social History  . Marital Status: Widowed    Spouse Name: N/A  . Number of Children: N/A  . Years of Education: N/A   Occupational History  . Not on file.   Social History Main Topics  . Smoking status: Never Smoker   . Smokeless tobacco: Never Used  . Alcohol Use: 0.6 oz/week    1 Glasses of wine per week     Comment: occasionally  . Drug Use: No  . Sexual Activity: Not on file   Other Topics Concern  . Not on file   Social History Narrative    Medications:   Current Outpatient Prescriptions on File Prior to Visit  Medication Sig Dispense Refill  . acetaminophen (TYLENOL) 500 MG tablet Take 1 tablet (500 mg total) by mouth 3 (three) times daily. For one week, then tid prn thereafter 90 tablet 0  . benzonatate (TESSALON) 100 MG capsule Take 1 capsule (100 mg total) by mouth every 8 (eight)  hours. 21 capsule 0  . ciprofloxacin (CIPRO) 250 MG tablet Take 1 tablet (250 mg total) by mouth 2 (two) times daily. For 3 days only. Stop date: 12/04/15 6 tablet 0  . Coenzyme Q10 (CO Q 10) 100 MG CAPS Take 1 tablet by mouth 2 (two) times daily.    . colestipol (COLESTID) 1 G tablet Take 1 tablet (1 g total) by mouth daily. 90 tablet 3  . diphenoxylate-atropine (LOMOTIL) 2.5-0.025 MG per tablet Take one tablet by mouth three times daily as needed for diarrhea (control) 90 tablet 0  .  donepezil (ARICEPT) 10 MG tablet Take 10 mg by mouth at bedtime.    . hydrocortisone cream 0.5 % Apply 1 application topically 2 (two) times daily. 30 g 0  . losartan (COZAAR) 50 MG tablet Take 1.5 tablets (75 mg total) by mouth daily. for blood pressure 135 tablet 3  . Melatonin 3 MG CAPS Take 1 capsule (3 mg total) by mouth at bedtime and may repeat dose one time if needed. 45 capsule 3  . metoprolol (LOPRESSOR) 50 MG tablet Take 1 tablet (50 mg total) by mouth 2 (two) times daily. 180 tablet 3  . Multiple Vitamin (MULITIVITAMIN WITH MINERALS) TABS Take 1 tablet by mouth daily.     Marland Kitchen saccharomyces boulardii (FLORASTOR) 250 MG capsule Take 1 capsule (250 mg total) by mouth 2 (two) times daily. 180 capsule 3  . Triamcinolone Acetonide (TRIAMCINOLONE 0.1 % CREAM : EUCERIN) CREA Apply 1 application topically daily as needed for rash (on face).    Marland Kitchen VAYACOG 100-19.5-6.5 MG CAPS TAKE 1 CAPSULE EVERY DAY. 30 capsule 0  . [DISCONTINUED] atorvastatin (LIPITOR) 10 MG tablet Take 1 tablet (10 mg total) by mouth daily. (Patient not taking: Reported on 06/10/2015) 90 tablet 3   No current facility-administered medications on file prior to visit.    Allergies:  No Known Allergies  Physical Exam General: Frail elderly Caucasian lady, seated, in no evident distress Head: head normocephalic and atraumatic.   Neck: supple with no carotid or supraclavicular bruits Cardiovascular: regular rate and rhythm, no murmurs Musculoskeletal: no deformity Skin:  no rash/petichiae Vascular:  Normal pulses all extremities  Neurologic Exam Mental Status: Awake and fully alert. Oriented to place and time. Recent and remote memory diminished. Attention span, concentration and fund of knowledge poor. Mood and affect appropriate. Diminished recall 0/3. Able to name only 5 animals. Cranial Nerves: Fundoscopic exam reveals sharp disc margins. Pupils equal, briskly reactive to light. Extraocular movements full without  nystagmus. Visual fields full to confrontation. Hearing intact. Facial sensation intact. Face, tongue, palate moves normally and symmetrically.  Motor: Normal bulk and tone. and strength in all tested extremity muscles on right side with mild 4/5 left hemiparesis with weakness of left grip, intrinsic hand muscles, hip flexors and ankle dorsiflexors. Tone is increased on the left side with mild spasticity in the left knee and elbow.. Sensory.: intact to touch , pinprick , position and vibratory sensation.  Coordination: Rapid alternating movements normal right sided extremities. And slow on the left Gait and Station: Arises from chair without difficulty. Stance is normal. Uses a walker to walk. Gait demonstrates normal stride length with dragging of the left leg and stiffness. Unable to heel, toe and tandem walk without difficulty.  Reflexes: 2+ and asymmetric and brisker on the left side. Toes downgoing.       ASSESSMENT: 33 patient with mild dementia with left-sided weakness likely due to small right subcortical infarct not visualized  on MRI as imaging was done later . Enlarged ventricles on MRI but proportionate to the degree of atrophy and dementia without clinical course suggestive of normal pressure hydrocephalus   PLAN: I had a long d/w patient was son regarding her left-sided weakness and gait difficulties likely represent silent right brain subcortical infarct not visualized on MRI. I feel the patient's clinical history and exam findings as well as imaging studies do not support a diagnosis of normal pressure hydrocephalus. She will continue Aricept in the current dose as she is not willing to try a higher dose of dementia. I discussed with them her risk  for recurrent stroke/TIAs, personally independently reviewed imaging studies and stroke evaluation results and answered questions.Start  aspirin 81 mg daily  for secondary stroke prevention and maintain strict control of hypertension with  blood pressure goal below 130/90, diabetes with hemoglobin A1c goal below 6.5% and lipids with LDL cholesterol goal below 70 mg/dL. I also advised the patient to eat a healthy diet with plenty of whole grains, cereals, fruits and vegetables, exercise regularly and maintain ideal body weight she was encouraged to continue ongoing physical and occupational therapy and to use a walker at all times for safety purpose. Greater than 50% time during this 45 minute consultation was spent on counseling and coordination of care about her mild dementia, silent stroke and abnormal MRI and answered questions Followup in the future with me in 2 months or call earlier if necessary. Greater than 50% time during this 45 minute consultation was spent on counseling and coordination of care about stroke, dementia and fall risk Delia HeadyPramod Kc Summerson, MD Medical Director [facility] Pager: [phone] 12/25/2015 5:55 PM  Note: This document was prepared with digital dictation and possible smart phrase technology. Any transcriptional errors that result from this process are unintentional.

## 2015-12-25 NOTE — Patient Instructions (Signed)
I had a long d/w patient was son regarding her left-sided weakness and gait difficulties likely represent silent right brain subcortical infarct not visualized on MRI. I feel the patient's clinical history and exam findings as well as imaging studies do not support a diagnosis of normal pressure hydrocephalus. She will continue Aricept in the current dose as she is not willing to try a higher dose of dementia. I discussed with them her risk  for recurrent stroke/TIAs, personally independently reviewed imaging studies and stroke evaluation results and answered questions.Start  aspirin 81 mg daily  for secondary stroke prevention and maintain strict control of hypertension with blood pressure goal below 130/90, diabetes with hemoglobin A1c goal below 6.5% and lipids with LDL cholesterol goal below 70 mg/dL. I also advised the patient to eat a healthy diet with plenty of whole grains, cereals, fruits and vegetables, exercise regularly and maintain ideal body weight she was encouraged to continue ongoing physical and occupational therapy and to use a walker at all times for safety purpose. Followup in the future with me in 2 months or call earlier if necessary  Stroke Prevention Some medical conditions and behaviors are associated with an increased chance of having a stroke. You may prevent a stroke by making healthy choices and managing medical conditions. HOW CAN I REDUCE MY RISK OF HAVING A STROKE?   Stay physically active. Get at least 30 minutes of activity on most or all days.  Do not smoke. It may also be helpful to avoid exposure to secondhand smoke.  Limit alcohol use. Moderate alcohol use is considered to be:  No more than 2 drinks per day for men.  No more than 1 drink per day for nonpregnant women.  Eat healthy foods. This involves:  Eating 5 or more servings of fruits and vegetables a day.  Making dietary changes that address high blood pressure (hypertension), high cholesterol,  diabetes, or obesity.  Manage your cholesterol levels.  Making food choices that are high in fiber and low in saturated fat, trans fat, and cholesterol may control cholesterol levels.  Take any prescribed medicines to control cholesterol as directed by your health care provider.  Manage your diabetes.  Controlling your carbohydrate and sugar intake is recommended to manage diabetes.  Take any prescribed medicines to control diabetes as directed by your health care provider.  Control your hypertension.  Making food choices that are low in salt (sodium), saturated fat, trans fat, and cholesterol is recommended to manage hypertension.  Ask your health care provider if you need treatment to lower your blood pressure. Take any prescribed medicines to control hypertension as directed by your health care provider.  If you are 28-47 years of age, have your blood pressure checked every 3-5 years. If you are 35 years of age or older, have your blood pressure checked every year.  Maintain a healthy weight.  Reducing calorie intake and making food choices that are low in sodium, saturated fat, trans fat, and cholesterol are recommended to manage weight.  Stop drug abuse.  Avoid taking birth control pills.  Talk to your health care provider about the risks of taking birth control pills if you are over 77 years old, smoke, get migraines, or have ever had a blood clot.  Get evaluated for sleep disorders (sleep apnea).  Talk to your health care provider about getting a sleep evaluation if you snore a lot or have excessive sleepiness.  Take medicines only as directed by your health care provider.  For some people, aspirin or blood thinners (anticoagulants) are helpful in reducing the risk of forming abnormal blood clots that can lead to stroke. If you have the irregular heart rhythm of atrial fibrillation, you should be on a blood thinner unless there is a good reason you cannot take  them.  Understand all your medicine instructions.  Make sure that other conditions (such as anemia or atherosclerosis) are addressed. SEEK IMMEDIATE MEDICAL CARE IF:   You have sudden weakness or numbness of the face, arm, or leg, especially on one side of the body.  Your face or eyelid droops to one side.  You have sudden confusion.  You have trouble speaking (aphasia) or understanding.  You have sudden trouble seeing in one or both eyes.  You have sudden trouble walking.  You have dizziness.  You have a loss of balance or coordination.  You have a sudden, severe headache with no known cause.  You have new chest pain or an irregular heartbeat. Any of these symptoms may represent a serious problem that is an emergency. Do not wait to see if the symptoms will go away. Get medical help at once. Call your local emergency services (911 in [place] Do not drive yourself to the hospital.   This information is not intended to replace advice given to you by your health care provider. Make sure you discuss any questions you have with your health care provider.   Document Released: 07/04/2004 Document Revised: 06/17/2014 Document Reviewed: 11/27/2012 Elsevier Interactive Patient Education [employer]2016 Elsevier Inc.

## 2015-12-26 ENCOUNTER — Telehealth: Payer: Self-pay

## 2015-12-26 ENCOUNTER — Telehealth: Payer: Self-pay | Admitting: Neurology

## 2015-12-26 DIAGNOSIS — E785 Hyperlipidemia, unspecified: Secondary | ICD-10-CM | POA: Diagnosis not present

## 2015-12-26 DIAGNOSIS — G919 Hydrocephalus, unspecified: Secondary | ICD-10-CM | POA: Diagnosis not present

## 2015-12-26 DIAGNOSIS — G309 Alzheimer's disease, unspecified: Secondary | ICD-10-CM | POA: Diagnosis not present

## 2015-12-26 DIAGNOSIS — Z9181 History of falling: Secondary | ICD-10-CM | POA: Diagnosis not present

## 2015-12-26 DIAGNOSIS — I10 Essential (primary) hypertension: Secondary | ICD-10-CM | POA: Diagnosis not present

## 2015-12-26 DIAGNOSIS — G8194 Hemiplegia, unspecified affecting left nondominant side: Secondary | ICD-10-CM | POA: Diagnosis not present

## 2015-12-26 NOTE — Telephone Encounter (Signed)
ok 

## 2015-12-26 NOTE — Telephone Encounter (Signed)
Rn call patients facility [place] [facility]. Rn stated that patient will be getting her lab work done when she has her doppler and carotid schedule. Rn also stated the patient has been schedule for echo bubble study and her family is aware. Bekky(RN) verbalized understanding.

## 2015-12-26 NOTE — Telephone Encounter (Signed)
Patient will be having her doppler on 01-03-2016 no Pa needed FYI. Thanks Annabelle Harmanana

## 2015-12-26 NOTE — Telephone Encounter (Signed)
I have [facility] to [facility] is scheduled for her echo Bubble No - PA was needed  01/02/2016 Beverly Barnett will push Bubbles. Dr[facility]  Spoke to patient's daughter she is aware of all details and appointments .

## 2015-12-27 ENCOUNTER — Telehealth: Payer: Self-pay | Admitting: *Deleted

## 2015-12-27 DIAGNOSIS — G919 Hydrocephalus, unspecified: Secondary | ICD-10-CM | POA: Diagnosis not present

## 2015-12-27 DIAGNOSIS — G8194 Hemiplegia, unspecified affecting left nondominant side: Secondary | ICD-10-CM | POA: Diagnosis not present

## 2015-12-27 DIAGNOSIS — Z9181 History of falling: Secondary | ICD-10-CM | POA: Diagnosis not present

## 2015-12-27 DIAGNOSIS — G309 Alzheimer's disease, unspecified: Secondary | ICD-10-CM | POA: Diagnosis not present

## 2015-12-27 DIAGNOSIS — E785 Hyperlipidemia, unspecified: Secondary | ICD-10-CM | POA: Diagnosis not present

## 2015-12-27 DIAGNOSIS — I10 Essential (primary) hypertension: Secondary | ICD-10-CM | POA: Diagnosis not present

## 2015-12-27 NOTE — Telephone Encounter (Signed)
Erin with [facility] called and requested Speech therapy for patient. Verbal orders given.

## 2015-12-30 DIAGNOSIS — G919 Hydrocephalus, unspecified: Secondary | ICD-10-CM | POA: Diagnosis not present

## 2015-12-30 DIAGNOSIS — Z9181 History of falling: Secondary | ICD-10-CM | POA: Diagnosis not present

## 2015-12-30 DIAGNOSIS — E785 Hyperlipidemia, unspecified: Secondary | ICD-10-CM | POA: Diagnosis not present

## 2015-12-30 DIAGNOSIS — G309 Alzheimer's disease, unspecified: Secondary | ICD-10-CM | POA: Diagnosis not present

## 2015-12-30 DIAGNOSIS — I10 Essential (primary) hypertension: Secondary | ICD-10-CM | POA: Diagnosis not present

## 2015-12-30 DIAGNOSIS — G8194 Hemiplegia, unspecified affecting left nondominant side: Secondary | ICD-10-CM | POA: Diagnosis not present

## 2016-01-01 DIAGNOSIS — G8194 Hemiplegia, unspecified affecting left nondominant side: Secondary | ICD-10-CM | POA: Diagnosis not present

## 2016-01-01 DIAGNOSIS — G309 Alzheimer's disease, unspecified: Secondary | ICD-10-CM | POA: Diagnosis not present

## 2016-01-01 DIAGNOSIS — Z9181 History of falling: Secondary | ICD-10-CM | POA: Diagnosis not present

## 2016-01-01 DIAGNOSIS — I10 Essential (primary) hypertension: Secondary | ICD-10-CM | POA: Diagnosis not present

## 2016-01-01 DIAGNOSIS — E785 Hyperlipidemia, unspecified: Secondary | ICD-10-CM | POA: Diagnosis not present

## 2016-01-01 DIAGNOSIS — G919 Hydrocephalus, unspecified: Secondary | ICD-10-CM | POA: Diagnosis not present

## 2016-01-02 ENCOUNTER — Encounter (HOSPITAL_COMMUNITY): Payer: Self-pay

## 2016-01-02 ENCOUNTER — Ambulatory Visit (HOSPITAL_COMMUNITY)
Admission: RE | Admit: 2016-01-02 | Discharge: 2016-01-02 | Disposition: A | Discharge status: Home / Self Care | Payer: Medicare Other | Source: Ambulatory Visit | Attending: Neurology | Admitting: Neurology

## 2016-01-02 DIAGNOSIS — I340 Nonrheumatic mitral (valve) insufficiency: Secondary | ICD-10-CM | POA: Diagnosis not present

## 2016-01-02 DIAGNOSIS — E7800 Pure hypercholesterolemia, unspecified: Secondary | ICD-10-CM | POA: Insufficient documentation

## 2016-01-02 DIAGNOSIS — I517 Cardiomegaly: Secondary | ICD-10-CM | POA: Diagnosis not present

## 2016-01-02 DIAGNOSIS — I639 Cerebral infarction, unspecified: Secondary | ICD-10-CM | POA: Diagnosis present

## 2016-01-02 DIAGNOSIS — E119 Type 2 diabetes mellitus without complications: Secondary | ICD-10-CM | POA: Diagnosis not present

## 2016-01-02 DIAGNOSIS — I358 Other nonrheumatic aortic valve disorders: Secondary | ICD-10-CM | POA: Insufficient documentation

## 2016-01-02 DIAGNOSIS — G8194 Hemiplegia, unspecified affecting left nondominant side: Secondary | ICD-10-CM

## 2016-01-02 NOTE — Progress Notes (Signed)
Started IV for bubble study at 1600; bubble study performed; IV d/c'd at 1615; pt tolerated well

## 2016-01-02 NOTE — Progress Notes (Signed)
  Echocardiogram 2D Echocardiogram with Bubble Study has been performed.  Nolon Rod 01/02/2016, 4:47 PM

## 2016-01-03 ENCOUNTER — Ambulatory Visit (INDEPENDENT_AMBULATORY_CARE_PROVIDER_SITE_OTHER): Payer: Medicare Other

## 2016-01-03 DIAGNOSIS — G919 Hydrocephalus, unspecified: Secondary | ICD-10-CM | POA: Diagnosis not present

## 2016-01-03 DIAGNOSIS — E785 Hyperlipidemia, unspecified: Secondary | ICD-10-CM | POA: Diagnosis not present

## 2016-01-03 DIAGNOSIS — G8194 Hemiplegia, unspecified affecting left nondominant side: Secondary | ICD-10-CM

## 2016-01-03 DIAGNOSIS — G309 Alzheimer's disease, unspecified: Secondary | ICD-10-CM | POA: Diagnosis not present

## 2016-01-03 DIAGNOSIS — Z0289 Encounter for other administrative examinations: Secondary | ICD-10-CM

## 2016-01-03 DIAGNOSIS — I10 Essential (primary) hypertension: Secondary | ICD-10-CM | POA: Diagnosis not present

## 2016-01-03 DIAGNOSIS — Z9181 History of falling: Secondary | ICD-10-CM | POA: Diagnosis not present

## 2016-01-04 DIAGNOSIS — Z9181 History of falling: Secondary | ICD-10-CM | POA: Diagnosis not present

## 2016-01-04 DIAGNOSIS — G309 Alzheimer's disease, unspecified: Secondary | ICD-10-CM | POA: Diagnosis not present

## 2016-01-04 DIAGNOSIS — E785 Hyperlipidemia, unspecified: Secondary | ICD-10-CM | POA: Diagnosis not present

## 2016-01-04 DIAGNOSIS — G8194 Hemiplegia, unspecified affecting left nondominant side: Secondary | ICD-10-CM | POA: Diagnosis not present

## 2016-01-04 DIAGNOSIS — G919 Hydrocephalus, unspecified: Secondary | ICD-10-CM | POA: Diagnosis not present

## 2016-01-04 DIAGNOSIS — I10 Essential (primary) hypertension: Secondary | ICD-10-CM | POA: Diagnosis not present

## 2016-01-05 DIAGNOSIS — Z9181 History of falling: Secondary | ICD-10-CM | POA: Diagnosis not present

## 2016-01-05 DIAGNOSIS — G309 Alzheimer's disease, unspecified: Secondary | ICD-10-CM | POA: Diagnosis not present

## 2016-01-05 DIAGNOSIS — G919 Hydrocephalus, unspecified: Secondary | ICD-10-CM | POA: Diagnosis not present

## 2016-01-05 DIAGNOSIS — I10 Essential (primary) hypertension: Secondary | ICD-10-CM | POA: Diagnosis not present

## 2016-01-05 DIAGNOSIS — G8194 Hemiplegia, unspecified affecting left nondominant side: Secondary | ICD-10-CM | POA: Diagnosis not present

## 2016-01-05 DIAGNOSIS — E785 Hyperlipidemia, unspecified: Secondary | ICD-10-CM | POA: Diagnosis not present

## 2016-01-08 DIAGNOSIS — G919 Hydrocephalus, unspecified: Secondary | ICD-10-CM | POA: Diagnosis not present

## 2016-01-08 DIAGNOSIS — I10 Essential (primary) hypertension: Secondary | ICD-10-CM | POA: Diagnosis not present

## 2016-01-08 DIAGNOSIS — E785 Hyperlipidemia, unspecified: Secondary | ICD-10-CM | POA: Diagnosis not present

## 2016-01-08 DIAGNOSIS — G309 Alzheimer's disease, unspecified: Secondary | ICD-10-CM | POA: Diagnosis not present

## 2016-01-08 DIAGNOSIS — G8194 Hemiplegia, unspecified affecting left nondominant side: Secondary | ICD-10-CM | POA: Diagnosis not present

## 2016-01-08 DIAGNOSIS — Z9181 History of falling: Secondary | ICD-10-CM | POA: Diagnosis not present

## 2016-01-09 DIAGNOSIS — G309 Alzheimer's disease, unspecified: Secondary | ICD-10-CM | POA: Diagnosis not present

## 2016-01-09 DIAGNOSIS — I10 Essential (primary) hypertension: Secondary | ICD-10-CM | POA: Diagnosis not present

## 2016-01-09 DIAGNOSIS — Z9181 History of falling: Secondary | ICD-10-CM | POA: Diagnosis not present

## 2016-01-09 DIAGNOSIS — G8194 Hemiplegia, unspecified affecting left nondominant side: Secondary | ICD-10-CM | POA: Diagnosis not present

## 2016-01-09 DIAGNOSIS — G919 Hydrocephalus, unspecified: Secondary | ICD-10-CM | POA: Diagnosis not present

## 2016-01-09 DIAGNOSIS — E785 Hyperlipidemia, unspecified: Secondary | ICD-10-CM | POA: Diagnosis not present

## 2016-01-10 DIAGNOSIS — I10 Essential (primary) hypertension: Secondary | ICD-10-CM | POA: Diagnosis not present

## 2016-01-10 DIAGNOSIS — Z9181 History of falling: Secondary | ICD-10-CM | POA: Diagnosis not present

## 2016-01-10 DIAGNOSIS — E785 Hyperlipidemia, unspecified: Secondary | ICD-10-CM | POA: Diagnosis not present

## 2016-01-10 DIAGNOSIS — G919 Hydrocephalus, unspecified: Secondary | ICD-10-CM | POA: Diagnosis not present

## 2016-01-10 DIAGNOSIS — G8194 Hemiplegia, unspecified affecting left nondominant side: Secondary | ICD-10-CM | POA: Diagnosis not present

## 2016-01-10 DIAGNOSIS — G309 Alzheimer's disease, unspecified: Secondary | ICD-10-CM | POA: Diagnosis not present

## 2016-01-11 DIAGNOSIS — G919 Hydrocephalus, unspecified: Secondary | ICD-10-CM | POA: Diagnosis not present

## 2016-01-11 DIAGNOSIS — G8194 Hemiplegia, unspecified affecting left nondominant side: Secondary | ICD-10-CM | POA: Diagnosis not present

## 2016-01-11 DIAGNOSIS — E785 Hyperlipidemia, unspecified: Secondary | ICD-10-CM | POA: Diagnosis not present

## 2016-01-11 DIAGNOSIS — Z9181 History of falling: Secondary | ICD-10-CM | POA: Diagnosis not present

## 2016-01-11 DIAGNOSIS — G309 Alzheimer's disease, unspecified: Secondary | ICD-10-CM | POA: Diagnosis not present

## 2016-01-11 DIAGNOSIS — I10 Essential (primary) hypertension: Secondary | ICD-10-CM | POA: Diagnosis not present

## 2016-01-15 ENCOUNTER — Other Ambulatory Visit: Payer: Self-pay | Admitting: Internal Medicine

## 2016-01-15 ENCOUNTER — Telehealth: Payer: Self-pay

## 2016-01-15 DIAGNOSIS — I10 Essential (primary) hypertension: Secondary | ICD-10-CM | POA: Diagnosis not present

## 2016-01-15 DIAGNOSIS — G309 Alzheimer's disease, unspecified: Secondary | ICD-10-CM | POA: Diagnosis not present

## 2016-01-15 DIAGNOSIS — Z9181 History of falling: Secondary | ICD-10-CM | POA: Diagnosis not present

## 2016-01-15 DIAGNOSIS — E785 Hyperlipidemia, unspecified: Secondary | ICD-10-CM | POA: Diagnosis not present

## 2016-01-15 DIAGNOSIS — G919 Hydrocephalus, unspecified: Secondary | ICD-10-CM | POA: Diagnosis not present

## 2016-01-15 DIAGNOSIS — G8194 Hemiplegia, unspecified affecting left nondominant side: Secondary | ICD-10-CM | POA: Diagnosis not present

## 2016-01-15 NOTE — Telephone Encounter (Signed)
[facility] to report an elevated B/P of 186/79, pulse 52.  Patient was without any symptoms at the time of visit. Please review and advise.  No pending appointment , last appointment 11/29/15

## 2016-01-16 ENCOUNTER — Other Ambulatory Visit: Payer: Self-pay | Admitting: *Deleted

## 2016-01-16 DIAGNOSIS — G309 Alzheimer's disease, unspecified: Secondary | ICD-10-CM | POA: Diagnosis not present

## 2016-01-16 DIAGNOSIS — Z9181 History of falling: Secondary | ICD-10-CM | POA: Diagnosis not present

## 2016-01-16 DIAGNOSIS — E785 Hyperlipidemia, unspecified: Secondary | ICD-10-CM | POA: Diagnosis not present

## 2016-01-16 DIAGNOSIS — G919 Hydrocephalus, unspecified: Secondary | ICD-10-CM | POA: Diagnosis not present

## 2016-01-16 DIAGNOSIS — I10 Essential (primary) hypertension: Secondary | ICD-10-CM | POA: Diagnosis not present

## 2016-01-16 DIAGNOSIS — G8194 Hemiplegia, unspecified affecting left nondominant side: Secondary | ICD-10-CM | POA: Diagnosis not present

## 2016-01-16 MED ORDER — LOSARTAN POTASSIUM 100 MG PO TABS: 100.0000 mg | ORAL_TABLET | Freq: Every day | ORAL | 4 refills | Status: DC

## 2016-01-16 MED ORDER — LOSARTAN POTASSIUM 100 MG PO TABS
100.0000 mg | ORAL_TABLET | Freq: Every day | ORAL | 4 refills | Status: DC
Start: 2016-01-16 — End: 2016-01-16

## 2016-01-16 NOTE — Telephone Encounter (Signed)
Change losartan 100mg  #30 take 1 po daily with 4 RF; check BP BID x 2 weeks and fax results to office weekly. Call if BP remains >160/90.   She needs a f/u appt in Sept for routine visit as indicated in her last OV with me

## 2016-01-16 NOTE — Telephone Encounter (Signed)
LMOM for Erin with [facility] to return call.  Patient daughter, Amy notified and agreed. Medication faxed to pharmacy. Appointment scheduled for September.

## 2016-01-16 NOTE — Telephone Encounter (Signed)
Faxed to [facility] per daughter Amy

## 2016-01-16 NOTE — Telephone Encounter (Signed)
Denny Peonrin called back and left message to leave her a message on her voicemail. Left message of Dr. Celene Skeenarter's response.

## 2016-01-17 DIAGNOSIS — G919 Hydrocephalus, unspecified: Secondary | ICD-10-CM | POA: Diagnosis not present

## 2016-01-17 DIAGNOSIS — E785 Hyperlipidemia, unspecified: Secondary | ICD-10-CM | POA: Diagnosis not present

## 2016-01-17 DIAGNOSIS — G309 Alzheimer's disease, unspecified: Secondary | ICD-10-CM | POA: Diagnosis not present

## 2016-01-17 DIAGNOSIS — I10 Essential (primary) hypertension: Secondary | ICD-10-CM | POA: Diagnosis not present

## 2016-01-17 DIAGNOSIS — Z9181 History of falling: Secondary | ICD-10-CM | POA: Diagnosis not present

## 2016-01-17 DIAGNOSIS — G8194 Hemiplegia, unspecified affecting left nondominant side: Secondary | ICD-10-CM | POA: Diagnosis not present

## 2016-01-19 DIAGNOSIS — G8194 Hemiplegia, unspecified affecting left nondominant side: Secondary | ICD-10-CM | POA: Diagnosis not present

## 2016-01-19 DIAGNOSIS — I10 Essential (primary) hypertension: Secondary | ICD-10-CM | POA: Diagnosis not present

## 2016-01-19 DIAGNOSIS — E785 Hyperlipidemia, unspecified: Secondary | ICD-10-CM | POA: Diagnosis not present

## 2016-01-19 DIAGNOSIS — G919 Hydrocephalus, unspecified: Secondary | ICD-10-CM | POA: Diagnosis not present

## 2016-01-19 DIAGNOSIS — Z9181 History of falling: Secondary | ICD-10-CM | POA: Diagnosis not present

## 2016-01-19 DIAGNOSIS — G309 Alzheimer's disease, unspecified: Secondary | ICD-10-CM | POA: Diagnosis not present

## 2016-01-22 ENCOUNTER — Other Ambulatory Visit: Payer: Self-pay

## 2016-01-22 DIAGNOSIS — E785 Hyperlipidemia, unspecified: Secondary | ICD-10-CM | POA: Diagnosis not present

## 2016-01-22 DIAGNOSIS — G309 Alzheimer's disease, unspecified: Secondary | ICD-10-CM | POA: Diagnosis not present

## 2016-01-22 DIAGNOSIS — I10 Essential (primary) hypertension: Secondary | ICD-10-CM | POA: Diagnosis not present

## 2016-01-22 DIAGNOSIS — G8194 Hemiplegia, unspecified affecting left nondominant side: Secondary | ICD-10-CM | POA: Diagnosis not present

## 2016-01-22 DIAGNOSIS — Z9181 History of falling: Secondary | ICD-10-CM | POA: Diagnosis not present

## 2016-01-22 DIAGNOSIS — G919 Hydrocephalus, unspecified: Secondary | ICD-10-CM | POA: Diagnosis not present

## 2016-01-22 NOTE — Telephone Encounter (Signed)
A fax was received [facility] for [facility] that stated, "Resident's blood pressure reading at 2 pm on 01/22/16 was b/p 91/56, pulse 58. Second reading was B/P 93/55, pulse 56. Please advise."  Jessica's response was, "Decrease lopressor to 25 mg by mouth BID. To take HR and B/P daily and record. Follow up with Dr. Montez Moritaarter in 2 weeks with readings."  Response was faxed [phone].

## 2016-01-24 DIAGNOSIS — G8194 Hemiplegia, unspecified affecting left nondominant side: Secondary | ICD-10-CM | POA: Diagnosis not present

## 2016-01-24 DIAGNOSIS — I10 Essential (primary) hypertension: Secondary | ICD-10-CM | POA: Diagnosis not present

## 2016-01-24 DIAGNOSIS — G919 Hydrocephalus, unspecified: Secondary | ICD-10-CM | POA: Diagnosis not present

## 2016-01-24 DIAGNOSIS — E785 Hyperlipidemia, unspecified: Secondary | ICD-10-CM | POA: Diagnosis not present

## 2016-01-24 DIAGNOSIS — Z9181 History of falling: Secondary | ICD-10-CM | POA: Diagnosis not present

## 2016-01-24 DIAGNOSIS — G309 Alzheimer's disease, unspecified: Secondary | ICD-10-CM | POA: Diagnosis not present

## 2016-01-25 DIAGNOSIS — G8194 Hemiplegia, unspecified affecting left nondominant side: Secondary | ICD-10-CM | POA: Diagnosis not present

## 2016-01-25 DIAGNOSIS — G919 Hydrocephalus, unspecified: Secondary | ICD-10-CM | POA: Diagnosis not present

## 2016-01-25 DIAGNOSIS — E785 Hyperlipidemia, unspecified: Secondary | ICD-10-CM | POA: Diagnosis not present

## 2016-01-25 DIAGNOSIS — I10 Essential (primary) hypertension: Secondary | ICD-10-CM | POA: Diagnosis not present

## 2016-01-25 DIAGNOSIS — G309 Alzheimer's disease, unspecified: Secondary | ICD-10-CM | POA: Diagnosis not present

## 2016-01-25 DIAGNOSIS — Z9181 History of falling: Secondary | ICD-10-CM | POA: Diagnosis not present

## 2016-01-29 DIAGNOSIS — G309 Alzheimer's disease, unspecified: Secondary | ICD-10-CM | POA: Diagnosis not present

## 2016-01-29 DIAGNOSIS — Z9181 History of falling: Secondary | ICD-10-CM | POA: Diagnosis not present

## 2016-01-29 DIAGNOSIS — G919 Hydrocephalus, unspecified: Secondary | ICD-10-CM | POA: Diagnosis not present

## 2016-01-29 DIAGNOSIS — I10 Essential (primary) hypertension: Secondary | ICD-10-CM | POA: Diagnosis not present

## 2016-01-29 DIAGNOSIS — E785 Hyperlipidemia, unspecified: Secondary | ICD-10-CM | POA: Diagnosis not present

## 2016-01-29 DIAGNOSIS — G8194 Hemiplegia, unspecified affecting left nondominant side: Secondary | ICD-10-CM | POA: Diagnosis not present

## 2016-01-30 DIAGNOSIS — G919 Hydrocephalus, unspecified: Secondary | ICD-10-CM | POA: Diagnosis not present

## 2016-01-30 DIAGNOSIS — G309 Alzheimer's disease, unspecified: Secondary | ICD-10-CM | POA: Diagnosis not present

## 2016-01-30 DIAGNOSIS — I10 Essential (primary) hypertension: Secondary | ICD-10-CM | POA: Diagnosis not present

## 2016-01-30 DIAGNOSIS — G8194 Hemiplegia, unspecified affecting left nondominant side: Secondary | ICD-10-CM | POA: Diagnosis not present

## 2016-01-30 DIAGNOSIS — E785 Hyperlipidemia, unspecified: Secondary | ICD-10-CM | POA: Diagnosis not present

## 2016-01-30 DIAGNOSIS — Z9181 History of falling: Secondary | ICD-10-CM | POA: Diagnosis not present

## 2016-02-01 DIAGNOSIS — G8194 Hemiplegia, unspecified affecting left nondominant side: Secondary | ICD-10-CM | POA: Diagnosis not present

## 2016-02-01 DIAGNOSIS — G309 Alzheimer's disease, unspecified: Secondary | ICD-10-CM | POA: Diagnosis not present

## 2016-02-01 DIAGNOSIS — Z9181 History of falling: Secondary | ICD-10-CM | POA: Diagnosis not present

## 2016-02-01 DIAGNOSIS — G919 Hydrocephalus, unspecified: Secondary | ICD-10-CM | POA: Diagnosis not present

## 2016-02-01 DIAGNOSIS — I10 Essential (primary) hypertension: Secondary | ICD-10-CM | POA: Diagnosis not present

## 2016-02-01 DIAGNOSIS — E785 Hyperlipidemia, unspecified: Secondary | ICD-10-CM | POA: Diagnosis not present

## 2016-02-02 DIAGNOSIS — I10 Essential (primary) hypertension: Secondary | ICD-10-CM | POA: Diagnosis not present

## 2016-02-02 DIAGNOSIS — Z9181 History of falling: Secondary | ICD-10-CM | POA: Diagnosis not present

## 2016-02-02 DIAGNOSIS — G919 Hydrocephalus, unspecified: Secondary | ICD-10-CM | POA: Diagnosis not present

## 2016-02-02 DIAGNOSIS — G309 Alzheimer's disease, unspecified: Secondary | ICD-10-CM | POA: Diagnosis not present

## 2016-02-02 DIAGNOSIS — E785 Hyperlipidemia, unspecified: Secondary | ICD-10-CM | POA: Diagnosis not present

## 2016-02-02 DIAGNOSIS — G8194 Hemiplegia, unspecified affecting left nondominant side: Secondary | ICD-10-CM | POA: Diagnosis not present

## 2016-02-04 ENCOUNTER — Emergency Department (HOSPITAL_COMMUNITY): Payer: Medicare Other

## 2016-02-04 ENCOUNTER — Emergency Department (HOSPITAL_COMMUNITY)
Admission: EM | Admit: 2016-02-04 | Discharge: 2016-02-04 | Disposition: A | Discharge status: Home / Self Care | Payer: Medicare Other | Attending: Emergency Medicine | Admitting: Emergency Medicine

## 2016-02-04 ENCOUNTER — Encounter (HOSPITAL_COMMUNITY): Payer: Self-pay | Admitting: Emergency Medicine

## 2016-02-04 DIAGNOSIS — N390 Urinary tract infection, site not specified: Secondary | ICD-10-CM

## 2016-02-04 DIAGNOSIS — Y9289 Other specified places as the place of occurrence of the external cause: Secondary | ICD-10-CM | POA: Diagnosis not present

## 2016-02-04 DIAGNOSIS — S8012XA Contusion of left lower leg, initial encounter: Secondary | ICD-10-CM | POA: Insufficient documentation

## 2016-02-04 DIAGNOSIS — R259 Unspecified abnormal involuntary movements: Secondary | ICD-10-CM | POA: Diagnosis not present

## 2016-02-04 DIAGNOSIS — Y999 Unspecified external cause status: Secondary | ICD-10-CM | POA: Insufficient documentation

## 2016-02-04 DIAGNOSIS — I10 Essential (primary) hypertension: Secondary | ICD-10-CM | POA: Diagnosis not present

## 2016-02-04 DIAGNOSIS — S6992XA Unspecified injury of left wrist, hand and finger(s), initial encounter: Secondary | ICD-10-CM | POA: Diagnosis present

## 2016-02-04 DIAGNOSIS — S61412A Laceration without foreign body of left hand, initial encounter: Secondary | ICD-10-CM | POA: Diagnosis not present

## 2016-02-04 DIAGNOSIS — G309 Alzheimer's disease, unspecified: Secondary | ICD-10-CM | POA: Diagnosis not present

## 2016-02-04 DIAGNOSIS — S0181XA Laceration without foreign body of other part of head, initial encounter: Secondary | ICD-10-CM | POA: Diagnosis not present

## 2016-02-04 DIAGNOSIS — Z79899 Other long term (current) drug therapy: Secondary | ICD-10-CM | POA: Diagnosis not present

## 2016-02-04 DIAGNOSIS — W19XXXA Unspecified fall, initial encounter: Secondary | ICD-10-CM | POA: Diagnosis not present

## 2016-02-04 DIAGNOSIS — Y939 Activity, unspecified: Secondary | ICD-10-CM | POA: Insufficient documentation

## 2016-02-04 DIAGNOSIS — S0990XA Unspecified injury of head, initial encounter: Secondary | ICD-10-CM | POA: Diagnosis not present

## 2016-02-04 DIAGNOSIS — E119 Type 2 diabetes mellitus without complications: Secondary | ICD-10-CM | POA: Diagnosis not present

## 2016-02-04 DIAGNOSIS — Z7982 Long term (current) use of aspirin: Secondary | ICD-10-CM | POA: Diagnosis not present

## 2016-02-04 LAB — CBC WITH DIFFERENTIAL/PLATELET
BASOS ABS: 0 10*3/uL (ref 0.0–0.1)
BASOS PCT: 0 %
EOS ABS: 0.2 10*3/uL (ref 0.0–0.7)
EOS PCT: 3 %
HCT: 36.6 % (ref 36.0–46.0)
Hemoglobin: 12.1 g/dL (ref 12.0–15.0)
LYMPHS PCT: 28 %
Lymphs Abs: 2.1 10*3/uL (ref 0.7–4.0)
MCH: 31.2 pg (ref 26.0–34.0)
MCHC: 33.1 g/dL (ref 30.0–36.0)
MCV: 94.3 fL (ref 78.0–100.0)
MONO ABS: 0.9 10*3/uL (ref 0.1–1.0)
Monocytes Relative: 12 %
Neutro Abs: 4.2 10*3/uL (ref 1.7–7.7)
Neutrophils Relative %: 57 %
PLATELETS: 182 10*3/uL (ref 150–400)
RBC: 3.88 MIL/uL (ref 3.87–5.11)
RDW: 14 % (ref 11.5–15.5)
WBC: 7.4 10*3/uL (ref 4.0–10.5)

## 2016-02-04 LAB — URINALYSIS, ROUTINE W REFLEX MICROSCOPIC
BILIRUBIN URINE: NEGATIVE
GLUCOSE, UA: NEGATIVE mg/dL
KETONES UR: NEGATIVE mg/dL
Nitrite: POSITIVE — AB
PROTEIN: NEGATIVE mg/dL
Specific Gravity, Urine: 1.018 (ref 1.005–1.030)
pH: 5.5 (ref 5.0–8.0)

## 2016-02-04 LAB — BASIC METABOLIC PANEL
Anion gap: 7 (ref 5–15)
BUN: 31 mg/dL — AB (ref 6–20)
CALCIUM: 9.1 mg/dL (ref 8.9–10.3)
CO2: 23 mmol/L (ref 22–32)
CREATININE: 1 mg/dL (ref 0.44–1.00)
Chloride: 112 mmol/L — ABNORMAL HIGH (ref 101–111)
GFR calc Af Amer: 56 mL/min — ABNORMAL LOW (ref >60)
GFR, EST NON AFRICAN AMERICAN: 48 mL/min — AB (ref >60)
GLUCOSE: 109 mg/dL — AB (ref 65–99)
POTASSIUM: 4.3 mmol/L (ref 3.5–5.1)
SODIUM: 142 mmol/L (ref 135–145)

## 2016-02-04 LAB — URINE MICROSCOPIC-ADD ON
RBC / HPF: NONE SEEN RBC/hpf (ref 0–5)
SQUAMOUS EPITHELIAL / LPF: NONE SEEN

## 2016-02-04 MED ORDER — CIPROFLOXACIN HCL 500 MG PO TABS
250.0000 mg | ORAL_TABLET | Freq: Once | ORAL | Status: AC
  Administered 2016-02-04: 250 mg via ORAL
  Filled 2016-02-04: qty 1

## 2016-02-04 MED ORDER — CIPROFLOXACIN HCL 250 MG PO TABS: 250.0000 mg | ORAL_TABLET | Freq: Two times a day (BID) | ORAL | 0 refills | Status: DC

## 2016-02-04 MED ORDER — ACETAMINOPHEN 325 MG PO TABS: 650.0000 mg | ORAL_TABLET | Freq: Once | ORAL | Status: DC

## 2016-02-04 NOTE — ED Notes (Signed)
[facility] notified of need for transport of pt back to residence.  

## 2016-02-04 NOTE — ED Notes (Signed)
Bed: [id number] Expected date:  Expected time:  Means of arrival:  Comments: [id number] Fall laceration

## 2016-02-04 NOTE — ED Notes (Signed)
Attempted to call report to SNF w/o success.  Called pt's daughter to ensure we had the right facility.  Called [facility] however they could not transfer [facility] to [facility]. Will send d/c instructions back to SNF w/ PTAR.

## 2016-02-04 NOTE — ED Provider Notes (Signed)
[facility]-EMERGENCY DEPT Provider Note   CSN: [id number] Arrival date & time: 02/04/16  0219  By signing my name below, I, Soijett Blue, attest that this documentation has been prepared under the direction and in the presence of Tilden Fossa, MD. Electronically Signed: Soijett Blue, ED Scribe. 02/04/16. 2:34 AM.   History   Chief Complaint Chief Complaint  Patient presents with  . Fall    HPI Beverly Barnett is a 82 y.o. female with a medical hx of HTN, DM, vascular dementia, high cholesterol, who presents to the Emergency Department brought in by EMS complaining of a fall onset PTA. EMS reports that the staff at the pt nursing home, [facility] heard the pt scream and found the pt on the floor. Pt states that she fell on the floor, but she is unsure of how she fell. Pt denies pain anywhere at this time. Pt is having associated symptoms of skin tear to left hand. She notes that she has not tried any medications for the relief of her symptoms. She denies CP, back pain, abdominal pain, HA, neck pain, hand pain, hip pain, and any other symptoms.    The history is provided by the patient, the EMS personnel and the nursing home. No language interpreter was used.    Past Medical History:  Diagnosis Date  . Cardiac catheterisation as the cause of abnormal reaction of patient, or of later complication 10/05/2006  . Dysphagia, unspecified(787.20)   . Encounter for long-term (current) use of other medications   . High cholesterol   . Hyperosmolality and/or hypernatremia   . Hypertension   . Osteoporosis   . Other atopic dermatitis and related conditions   . Type II or unspecified type diabetes mellitus without mention of complication, not stated as uncontrolled   . Vascular dementia 06/22/2013    Patient Active Problem List   Diagnosis Date Noted  . Left hemiparesis (HCC) 12/25/2015  . Abnormal brain MRI 12/25/2015  . Hydrocephalus ex vacuo 12/25/2015  . Essential hypertension, benign  11/29/2015  . Psoriasis 11/29/2015  . 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 Essential hypertension 04/09/2015  . Hip pain 04/09/2015  . Unsteady gait 04/09/2015  . Right hip pain 12/09/2014  . Frequent falls 12/09/2014  . UTI (urinary tract infection) 09/06/2014  . 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 Insomnia 01/11/2014  . Dementia, vascular, mixed 10/13/2013  . Left leg weakness 10/13/2013  . Intertrochanteric fracture of left hip (HCC) 07/29/2013  . Anemia 07/29/2013  . Hip fracture, left (HCC) 07/29/2013  . Vascular dementia 06/22/2013  . Hyperlipidemia with target LDL less than 130 06/22/2013  . Other and unspecified hyperlipidemia 02/16/2013  . Osteoporosis 01/05/2013  . Dementia 01/05/2013  . Hypertension   . High cholesterol     Past Surgical History:  Procedure Laterality Date  . FEMUR IM NAIL  07/24/2011   Procedure: INTRAMEDULLARY (IM) NAIL FEMORAL;  Surgeon: Javier Docker, MD;  Location: [facility];  Service: Orthopedics;  Laterality: Right;  Biomet, c-arm, Jackson table  . FEMUR SURGERY Right 03/2010  . FRACTURE SURGERY Left    Hip, Leg and shoulder  . INTRAMEDULLARY (IM) NAIL INTERTROCHANTERIC Left 07/30/2013   Procedure: INTRAMEDULLARY (IM) NAIL INTERTROCHANTRIC LEFT;  Surgeon: Shelda Pal, MD;  Location: [facility];  Service: Orthopedics;  Laterality: Left;  . SHOULDER SURGERY Left 03/2010    OB History    No data available       Home Medications    Prior to Admission  medications   Medication Sig Start Date End Date Taking? Authorizing Provider  acetaminophen (TYLENOL) 500 MG tablet Take 1 tablet (500 mg total) by mouth 3 (three) times daily. For one week, then tid prn thereafter 06/19/15   Kermit Baloiffany L Reed, DO  aspirin EC 81 MG tablet Take 1 tablet (81 mg total) by mouth daily. 12/25/15   Micki RileyPramod S Sethi, MD  benzonatate (TESSALON) 100 MG capsule Take 1 capsule (100 mg total) by mouth every 8 (eight) hours. 06/10/15   Shawn C Joy, PA-C    ciprofloxacin (CIPRO) 250 MG tablet Take 1 tablet (250 mg total) by mouth every 12 (twelve) hours. 02/04/16   Tilden FossaElizabeth Yazhini Mcaulay, MD  Coenzyme Q10 (CO Q 10) 100 MG CAPS Take 1 tablet by mouth 2 (two) times daily.    Historical Provider, MD  colestipol (COLESTID) 1 G tablet Take 1 tablet (1 g total) by mouth daily. 10/07/14   Tiffany L Reed, DO  diphenoxylate-atropine (LOMOTIL) 2.5-0.025 MG per tablet Take one tablet by mouth three times daily as needed for diarrhea (control) 12/20/14   Sharon SellerJessica K Eubanks, NP  donepezil (ARICEPT) 10 MG tablet Take 10 mg by mouth at bedtime.    Historical Provider, MD  hydrocortisone cream 0.5 % Apply 1 application topically 2 (two) times daily. 03/03/15   Kirt BoysMonica Carter, DO  Melatonin 3 MG CAPS Take 1 capsule (3 mg total) by mouth at bedtime and may repeat dose one time if needed. 10/07/14   Tiffany L Reed, DO  metoprolol (LOPRESSOR) 50 MG tablet Take 1 tablet (50 mg total) by mouth 2 (two) times daily. 10/07/14   Tiffany L Reed, DO  metoprolol tartrate (LOPRESSOR) 25 MG tablet Take 25 mg by mouth 2 (two) times daily.    Historical Provider, MD  Multiple Vitamin (MULITIVITAMIN WITH MINERALS) TABS Take 1 tablet by mouth daily.     Historical Provider, MD  saccharomyces boulardii (FLORASTOR) 250 MG capsule Take 1 capsule (250 mg total) by mouth 2 (two) times daily. 10/07/14   Tiffany L Reed, DO  Triamcinolone Acetonide (TRIAMCINOLONE 0.1 % CREAM : EUCERIN) CREA Apply 1 application topically daily as needed for rash (on face).    Historical Provider, MD  VAYACOG 100-19.5-6.5 MG CAPS TAKE 1 CAPSULE EVERY DAY. 01/15/16   Kirt BoysMonica Carter, DO    Family History Family History  Problem Relation Age of Onset  . Cancer Sister   . Heart disease Father     Social History Social History  Substance Use Topics  . Smoking status: Never Smoker  . Smokeless tobacco: Never Used  . Alcohol use 0.6 oz/week    1 Glasses of wine per week     Comment: occasionally     Allergies   Review of  patient's allergies indicates no known allergies.   Review of Systems Review of Systems  Cardiovascular: Negative for chest pain.  Gastrointestinal: Negative for abdominal pain.  Musculoskeletal: Negative for arthralgias, back pain and neck pain.  Skin: Positive for color change and wound (skin tear to left hand).  Neurological: Negative for headaches.     Physical Exam Updated Vital Signs BP 164/63 (BP Location: Left Arm)   Pulse 70   Temp 97.8 F (36.6 C) (Oral)   Resp 20   Ht 5\' 4"  (1.626 m)   Wt 125 lb (56.7 kg)   SpO2 96%   BMI 21.46 kg/m   Physical Exam  Constitutional: She is oriented to person, place, and time. She appears well-developed and well-nourished.  HENT:  Head: Normocephalic and atraumatic.  Cardiovascular: Normal rate and regular rhythm.   No murmur heard. Pulmonary/Chest: Effort normal and breath sounds normal. No respiratory distress.  Abdominal: Soft. There is no tenderness. There is no rebound and no guarding.  Musculoskeletal: She exhibits no edema or tenderness.  Neurological: She is alert and oriented to person, place, and time.  Skin: Skin is warm and dry. Ecchymosis noted.  Skin tear to left dorsal hand at the base of the second digit. Large amount of ecchymosis to left shin and calf.   Psychiatric: She has a normal mood and affect. Her behavior is normal.  Nursing note and vitals reviewed.    ED Treatments / Results  DIAGNOSTIC STUDIES: Oxygen Saturation is 95% on RA, adequate by my interpretation.    COORDINATION OF CARE: 2:29 AM Discussed treatment plan with pt at bedside which includes labs, imaging and pt agreed to plan.   Labs (all labs ordered are listed, but only abnormal results are displayed) Labs Reviewed  URINALYSIS, ROUTINE W REFLEX MICROSCOPIC (NOT AT [facility]) - Abnormal; Notable for the following:       Result Value   APPearance CLOUDY (*)    Hgb urine dipstick Darius (*)    Nitrite POSITIVE (*)    Leukocytes, UA  MODERATE (*)    All other components within normal limits  BASIC METABOLIC PANEL - Abnormal; Notable for the following:    Chloride 112 (*)    Glucose, Bld 109 (*)    BUN 31 (*)    GFR calc non Af Amer 48 (*)    GFR calc Af Amer 56 (*)    All other components within normal limits  URINE MICROSCOPIC-ADD ON - Abnormal; Notable for the following:    Bacteria, UA MANY (*)    All other components within normal limits  URINE CULTURE  CBC WITH DIFFERENTIAL/PLATELET    EKG  EKG Interpretation None       Radiology Dg Hand Complete Left  Result Date: 02/04/2016 CLINICAL DATA:  Left hand pain with laceration at second metacarpal. Bruising appears the dorsal surface of the hand. Fell this morning. EXAM: LEFT HAND - COMPLETE 3+ VIEW COMPARISON:  None. FINDINGS: Diffuse bone demineralization. Degenerative changes in the interphalangeal joints, radiocarpal, STT, first carpometacarpal, and first metacarpal phalangeal joints. No evidence of acute fracture or dislocation. No focal bone lesion or bone destruction. Soft tissues are unremarkable. IMPRESSION: Diffuse bone demineralization and degenerative changes. No acute bony abnormalities. Electronically Signed   By: Burman Nieves M.D.   On: 02/04/2016 03:51    Procedures Procedures (including critical care time)  Medications Ordered in ED Medications  ciprofloxacin (CIPRO) tablet 250 mg (250 mg Oral Given 02/04/16 9629)     Initial Impression / Assessment and Plan / ED Course  I have reviewed the triage vital signs and the nursing notes.  Pertinent labs & imaging results that were available during my care of the patient were reviewed by me and considered in my medical decision making (see chart for details).  Clinical Course    Patient here for evaluation of injuries following an unwitnessed fall. She has dementia and causes follows unclear. Her son did arrive to the emergency department and states that she sometimes falls when she has  urinary tract infection. UA is consistent with UTI and will treat with antibiotics. She has no evidence of head injury on examination, CT head deferred.  She is able to ambulate to ambulate at her baseline without  pain or difficulty.  Plan to DC back to facility with treatment for her urinary tract infection, outpatient follow-up, return precautions.  Final Clinical Impressions(s) / ED Diagnoses   Final diagnoses:  Acute UTI  Fall, initial encounter  Skin tear of hand without complication, left, initial encounter    New Prescriptions Discharge Medication List as of 02/04/2016  5:06 AM      I personally performed the services described in this documentation, which was scribed in my presence. The recorded information has been reviewed and is accurate.     Tilden Fossa, MD 02/04/16 [phone]

## 2016-02-04 NOTE — ED Triage Notes (Signed)
[facility] unwitnessed fall that occurred tonight. Uncertain of LOC. PT reports falling and per EMS pt has skin tear to left hand with contusion.

## 2016-02-05 DIAGNOSIS — I10 Essential (primary) hypertension: Secondary | ICD-10-CM | POA: Diagnosis not present

## 2016-02-05 DIAGNOSIS — E785 Hyperlipidemia, unspecified: Secondary | ICD-10-CM | POA: Diagnosis not present

## 2016-02-05 DIAGNOSIS — G309 Alzheimer's disease, unspecified: Secondary | ICD-10-CM | POA: Diagnosis not present

## 2016-02-05 DIAGNOSIS — G8194 Hemiplegia, unspecified affecting left nondominant side: Secondary | ICD-10-CM | POA: Diagnosis not present

## 2016-02-05 DIAGNOSIS — G919 Hydrocephalus, unspecified: Secondary | ICD-10-CM | POA: Diagnosis not present

## 2016-02-05 DIAGNOSIS — Z9181 History of falling: Secondary | ICD-10-CM | POA: Diagnosis not present

## 2016-02-06 LAB — URINE CULTURE

## 2016-02-07 ENCOUNTER — Telehealth (HOSPITAL_BASED_OUTPATIENT_CLINIC_OR_DEPARTMENT_OTHER): Payer: Self-pay | Admitting: Emergency Medicine

## 2016-02-07 DIAGNOSIS — E785 Hyperlipidemia, unspecified: Secondary | ICD-10-CM | POA: Diagnosis not present

## 2016-02-07 DIAGNOSIS — I10 Essential (primary) hypertension: Secondary | ICD-10-CM | POA: Diagnosis not present

## 2016-02-07 DIAGNOSIS — Z9181 History of falling: Secondary | ICD-10-CM | POA: Diagnosis not present

## 2016-02-07 DIAGNOSIS — G919 Hydrocephalus, unspecified: Secondary | ICD-10-CM | POA: Diagnosis not present

## 2016-02-07 DIAGNOSIS — G8194 Hemiplegia, unspecified affecting left nondominant side: Secondary | ICD-10-CM | POA: Diagnosis not present

## 2016-02-07 DIAGNOSIS — G309 Alzheimer's disease, unspecified: Secondary | ICD-10-CM | POA: Diagnosis not present

## 2016-02-07 NOTE — Telephone Encounter (Signed)
Post ED Visit - Positive Culture Follow-up  Culture report reviewed by antimicrobial stewardship pharmacist:  []  Enzo BiNathan Batchelder, Pharm.D. []  Celedonio MiyamotoJeremy Frens, Pharm.D., BCPS []  Garvin FilaMike Maccia, Pharm.D. []  Georgina PillionElizabeth Martin, Pharm.D., BCPS []  CentraliaMinh Pham, VermontPharm.D., BCPS, AAHIVP []  Estella HuskMichelle Turner, Pharm.D., BCPS, AAHIVP []  Tennis Mustassie Stewart, Pharm.D. []  Sherle Poeob Vincent, [address] BoulevardPharm.D. Fredonia HighlandMichael Bitonti PharmD  Positive urine culture Treated with ciprofloxacin, organism sensitive to the same and no further patient follow-up is required at this time.  Berle MullMiller, Naryah Clenney 02/07/2016, 10:07 AM

## 2016-02-08 ENCOUNTER — Emergency Department (HOSPITAL_COMMUNITY)
Admission: EM | Admit: 2016-02-08 | Discharge: 2016-02-09 | Disposition: A | Discharge status: Home / Self Care | Payer: Medicare Other | Attending: Emergency Medicine | Admitting: Emergency Medicine

## 2016-02-08 ENCOUNTER — Encounter (HOSPITAL_COMMUNITY): Payer: Self-pay | Admitting: Emergency Medicine

## 2016-02-08 DIAGNOSIS — Z79899 Other long term (current) drug therapy: Secondary | ICD-10-CM | POA: Insufficient documentation

## 2016-02-08 DIAGNOSIS — I10 Essential (primary) hypertension: Secondary | ICD-10-CM | POA: Insufficient documentation

## 2016-02-08 DIAGNOSIS — R319 Hematuria, unspecified: Secondary | ICD-10-CM | POA: Diagnosis not present

## 2016-02-08 DIAGNOSIS — R030 Elevated blood-pressure reading, without diagnosis of hypertension: Secondary | ICD-10-CM | POA: Diagnosis not present

## 2016-02-08 DIAGNOSIS — E119 Type 2 diabetes mellitus without complications: Secondary | ICD-10-CM | POA: Diagnosis not present

## 2016-02-08 DIAGNOSIS — E785 Hyperlipidemia, unspecified: Secondary | ICD-10-CM | POA: Diagnosis not present

## 2016-02-08 DIAGNOSIS — Z7982 Long term (current) use of aspirin: Secondary | ICD-10-CM | POA: Diagnosis not present

## 2016-02-08 DIAGNOSIS — N390 Urinary tract infection, site not specified: Secondary | ICD-10-CM | POA: Diagnosis not present

## 2016-02-08 DIAGNOSIS — G309 Alzheimer's disease, unspecified: Secondary | ICD-10-CM | POA: Diagnosis not present

## 2016-02-08 DIAGNOSIS — G8194 Hemiplegia, unspecified affecting left nondominant side: Secondary | ICD-10-CM | POA: Diagnosis not present

## 2016-02-08 DIAGNOSIS — G919 Hydrocephalus, unspecified: Secondary | ICD-10-CM | POA: Diagnosis not present

## 2016-02-08 DIAGNOSIS — Z9181 History of falling: Secondary | ICD-10-CM | POA: Diagnosis not present

## 2016-02-08 LAB — URINALYSIS, ROUTINE W REFLEX MICROSCOPIC
Bilirubin Urine: NEGATIVE
GLUCOSE, UA: NEGATIVE mg/dL
KETONES UR: NEGATIVE mg/dL
LEUKOCYTES UA: NEGATIVE
NITRITE: NEGATIVE
PROTEIN: NEGATIVE mg/dL
Specific Gravity, Urine: 1.009 (ref 1.005–1.030)
pH: 6 (ref 5.0–8.0)

## 2016-02-08 LAB — CBC WITH DIFFERENTIAL/PLATELET
BASOS ABS: 0 10*3/uL (ref 0.0–0.1)
BASOS PCT: 1 %
EOS PCT: 5 %
Eosinophils Absolute: 0.4 10*3/uL (ref 0.0–0.7)
HCT: 38.4 % (ref 36.0–46.0)
Hemoglobin: 12.8 g/dL (ref 12.0–15.0)
Lymphocytes Relative: 35 %
Lymphs Abs: 2.7 10*3/uL (ref 0.7–4.0)
MCH: 30.8 pg (ref 26.0–34.0)
MCHC: 33.3 g/dL (ref 30.0–36.0)
MCV: 92.3 fL (ref 78.0–100.0)
MONO ABS: 1 10*3/uL (ref 0.1–1.0)
Monocytes Relative: 13 %
Neutro Abs: 3.7 10*3/uL (ref 1.7–7.7)
Neutrophils Relative %: 46 %
PLATELETS: 178 10*3/uL (ref 150–400)
RBC: 4.16 MIL/uL (ref 3.87–5.11)
RDW: 13.5 % (ref 11.5–15.5)
WBC: 7.9 10*3/uL (ref 4.0–10.5)

## 2016-02-08 LAB — BASIC METABOLIC PANEL
Anion gap: 7 (ref 5–15)
BUN: 28 mg/dL — AB (ref 6–20)
CALCIUM: 9.4 mg/dL (ref 8.9–10.3)
CO2: 25 mmol/L (ref 22–32)
Chloride: 109 mmol/L (ref 101–111)
Creatinine, Ser: 0.87 mg/dL (ref 0.44–1.00)
GFR calc Af Amer: >60 mL/min (ref >60)
GFR, EST NON AFRICAN AMERICAN: 57 mL/min — AB (ref >60)
GLUCOSE: 105 mg/dL — AB (ref 65–99)
Potassium: 4.4 mmol/L (ref 3.5–5.1)
Sodium: 141 mmol/L (ref 135–145)

## 2016-02-08 LAB — POC OCCULT BLOOD, ED: FECAL OCCULT BLD: NEGATIVE

## 2016-02-08 LAB — URINE MICROSCOPIC-ADD ON

## 2016-02-08 NOTE — ED Provider Notes (Signed)
[facility]-EMERGENCY DEPT Provider Note   CSN: [id number] Arrival date & time: 02/08/16  2150     History   Chief Complaint Chief Complaint  Patient presents with  . Urinary Tract Infection    HPI Beverly Barnett is a 82 y.o. female.  Pt brought in by EMS because of hematuria.  The pt said that she thought the blood was coming from her bottom, but she was not sure.  She was here on 8/27 with a fall and was dx'd with an uti.  The pt was placed on cipro on the 27th.  The urine was cultured and grew out klebsiella which was sensitive to cipro.  The pt said that she feels fine.      Past Medical History:  Diagnosis Date  . Cardiac catheterisation as the cause of abnormal reaction of patient, or of later complication 10/05/2006  . Dysphagia, unspecified(787.20)   . Encounter for long-term (current) use of other medications   . High cholesterol   . Hyperosmolality and/or hypernatremia   . Hypertension   . Osteoporosis   . Other atopic dermatitis and related conditions   . Type II or unspecified type diabetes mellitus without mention of complication, not stated as uncontrolled   . Vascular dementia 06/22/2013    Patient Active Problem List   Diagnosis Date Noted  . Left hemiparesis (HCC) 12/25/2015  . Abnormal brain MRI 12/25/2015  . Hydrocephalus ex vacuo 12/25/2015  . Essential hypertension, benign 11/29/2015  . Psoriasis 11/29/2015  . 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 Essential hypertension 04/09/2015  . Hip pain 04/09/2015  . Unsteady gait 04/09/2015  . Right hip pain 12/09/2014  . Frequent falls 12/09/2014  . UTI (urinary tract infection) 09/06/2014  . 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 Insomnia 01/11/2014  . Dementia, vascular, mixed 10/13/2013  . Left leg weakness 10/13/2013  . Intertrochanteric fracture of left hip (HCC) 07/29/2013  . Anemia 07/29/2013  . Hip fracture, left (HCC) 07/29/2013  . Vascular dementia 06/22/2013  . Hyperlipidemia with  target LDL less than 130 06/22/2013  . Other and unspecified hyperlipidemia 02/16/2013  . Osteoporosis 01/05/2013  . Dementia 01/05/2013  . Hypertension   . High cholesterol     Past Surgical History:  Procedure Laterality Date  . FEMUR IM NAIL  07/24/2011   Procedure: INTRAMEDULLARY (IM) NAIL FEMORAL;  Surgeon: Javier Docker, MD;  Location: [facility];  Service: Orthopedics;  Laterality: Right;  Biomet, c-arm, Jackson table  . FEMUR SURGERY Right 03/2010  . FRACTURE SURGERY Left    Hip, Leg and shoulder  . INTRAMEDULLARY (IM) NAIL INTERTROCHANTERIC Left 07/30/2013   Procedure: INTRAMEDULLARY (IM) NAIL INTERTROCHANTRIC LEFT;  Surgeon: Shelda Pal, MD;  Location: [facility];  Service: Orthopedics;  Laterality: Left;  . SHOULDER SURGERY Left 03/2010    OB History    No data available       Home Medications    Prior to Admission medications   Medication Sig Start Date End Date Taking? Authorizing Provider  acetaminophen (TYLENOL) 500 MG tablet Take 1 tablet (500 mg total) by mouth 3 (three) times daily. For one week, then tid prn thereafter 06/19/15  Yes Tiffany L Reed, DO  aspirin EC 81 MG tablet Take 1 tablet (81 mg total) by mouth daily. 12/25/15  Yes Micki Riley, MD  benzonatate (TESSALON) 100 MG capsule Take 1 capsule (100 mg total) by mouth every 8 (eight) hours. 06/10/15  Yes Shawn C Joy, PA-C  Coenzyme Q10 (  CO Q 10) 100 MG CAPS Take 1 tablet by mouth 2 (two) times daily.   Yes Historical Provider, MD  colestipol (COLESTID) 1 G tablet Take 1 tablet (1 g total) by mouth daily. 10/07/14  Yes Tiffany L Reed, DO  diphenoxylate-atropine (LOMOTIL) 2.5-0.025 MG per tablet Take one tablet by mouth three times daily as needed for diarrhea (control) 12/20/14  Yes Sharon Seller, NP  donepezil (ARICEPT) 10 MG tablet Take 10 mg by mouth at bedtime.   Yes Historical Provider, MD  losartan (COZAAR) 100 MG tablet Take 100 mg by mouth daily.   Yes Historical Provider, MD  Melatonin 3 MG CAPS  Take 1 capsule (3 mg total) by mouth at bedtime and may repeat dose one time if needed. 10/07/14  Yes Tiffany L Reed, DO  metoprolol tartrate (LOPRESSOR) 25 MG tablet Take 25 mg by mouth 2 (two) times daily.   Yes Historical Provider, MD  Multiple Vitamin (MULITIVITAMIN WITH MINERALS) TABS Take 1 tablet by mouth daily.    Yes Historical Provider, MD  neomycin-bacitracin-polymyxin (NEOSPORIN) ointment Apply 1 application topically daily. apply to eye   Yes Historical Provider, MD  saccharomyces boulardii (FLORASTOR) 250 MG capsule Take 1 capsule (250 mg total) by mouth 2 (two) times daily. 10/07/14  Yes Tiffany L Reed, DO  Triamcinolone Acetonide (TRIAMCINOLONE 0.1 % CREAM : EUCERIN) CREA Apply 1 application topically daily as needed for rash (on face).   Yes Historical Provider, MD  VAYACOG 100-19.5-6.5 MG CAPS TAKE 1 CAPSULE EVERY DAY. 01/15/16  Yes [name], DO  ciprofloxacin (CIPRO) 250 MG tablet Take 1 tablet (250 mg total) by mouth every 12 (twelve) hours. Patient not taking: Reported on 02/08/2016 02/04/16   Tilden Fossa, MD  hydrocortisone cream 0.5 % Apply 1 application topically 2 (two) times daily. Patient not taking: Reported on 02/08/2016 03/03/15   [name], DO  metoprolol (LOPRESSOR) 50 MG tablet Take 1 tablet (50 mg total) by mouth 2 (two) times daily. Patient not taking: Reported on 02/08/2016 10/07/14   Kermit Balo, DO    Family History Family History  Problem Relation Age of Onset  . Cancer Sister   . Heart disease Father     Social History Social History  Substance Use Topics  . Smoking status: Never Smoker  . Smokeless tobacco: Never Used  . Alcohol use 0.6 oz/week    1 Glasses of wine per week     Comment: occasionally     Allergies   Review of patient's allergies indicates no known allergies.   Review of Systems Review of Systems  Genitourinary: Positive for hematuria.  All other systems reviewed and are negative.    Physical Exam Updated Vital  Signs SpO2 95%   Physical Exam  Constitutional: She is oriented to person, place, and time. She appears well-developed and well-nourished.  HENT:  Head: Normocephalic and atraumatic.  Right Ear: External ear normal.  Left Ear: External ear normal.  Nose: Nose normal.  Mouth/Throat: Oropharynx is clear and moist.  Eyes: Conjunctivae and EOM are normal. Pupils are equal, round, and reactive to light.  Neck: Normal range of motion. Neck supple.  Cardiovascular: Normal rate, regular rhythm, normal heart sounds and intact distal pulses.   Pulmonary/Chest: Effort normal and breath sounds normal.  Abdominal: Soft. Bowel sounds are normal.  Genitourinary: Rectal exam shows guaiac negative stool.  Musculoskeletal: Normal range of motion.  Neurological: She is alert and oriented to person, place, and time.  Skin: Skin is warm  and dry.  Psychiatric: She has a normal mood and affect. Her behavior is normal. Judgment and thought content normal.  Nursing note and vitals reviewed.    ED Treatments / Results  Labs (all labs ordered are listed, but only abnormal results are displayed) Labs Reviewed  BASIC METABOLIC PANEL - Abnormal; Notable for the following:       Result Value   Glucose, Bld 105 (*)    BUN 28 (*)    GFR calc non Af Amer 57 (*)    All other components within normal limits  URINALYSIS, ROUTINE W REFLEX MICROSCOPIC (NOT AT [facility]) - Abnormal; Notable for the follow[facility]using:    Hgb urine dipstick LARGE (*)    All other components within normal limits  URINE MICROSCOPIC-ADD ON - Abnormal; Notable for the following:    Squamous Epithelial / LPF 0-5 (*)    Bacteria, UA FEW (*)    All other components within normal limits  CBC WITH DIFFERENTIAL/PLATELET  POC OCCULT BLOOD, ED    EKG  EKG Interpretation None       Radiology No results found.  Procedures Procedures (including critical care time)  Medications Ordered in ED Medications - No data to display   Initial  Impression / Assessment and Plan / ED Course  I have reviewed the triage vital signs and the nursing notes.  Pertinent labs & imaging results that were available during my care of the patient were reviewed by me and considered in my medical decision making (see chart for details).  Clinical Course   Uti is much improved.  I will not change any of pt's meds.  Pt ok with plan.  Pt knows to return if worse.  Final Clinical Impressions(s) / ED Diagnoses   Final diagnoses:  UTI (lower urinary tract infection)    New Prescriptions New Prescriptions   No medications on file  Dictation #1 [id number]  [id number]    Jacalyn LefevreJulie Semone Orlov, MD 02/09/16 0025

## 2016-02-08 NOTE — ED Notes (Signed)
Bed: WA21 Expected date:  Expected time:  Means of arrival:  Comments: EMS patient. Patient uti from nursing home

## 2016-02-08 NOTE — ED Notes (Signed)
RN will draw blood work 

## 2016-02-08 NOTE — ED Triage Notes (Signed)
Pt from [facility] with complaints of hematuria that began today. Pt was diagnosed with a UTI on 8/27 when she was seen for a fall. Pt has no complaints of pain.

## 2016-02-09 DIAGNOSIS — R102 Pelvic and perineal pain: Secondary | ICD-10-CM | POA: Diagnosis not present

## 2016-02-09 DIAGNOSIS — N390 Urinary tract infection, site not specified: Secondary | ICD-10-CM | POA: Diagnosis not present

## 2016-02-09 NOTE — ED Notes (Signed)
[facility] called  

## 2016-02-09 NOTE — Discharge Instructions (Signed)
Continue cipro

## 2016-02-10 ENCOUNTER — Encounter: Payer: Self-pay | Admitting: Internal Medicine

## 2016-02-12 DIAGNOSIS — G8194 Hemiplegia, unspecified affecting left nondominant side: Secondary | ICD-10-CM | POA: Diagnosis not present

## 2016-02-12 DIAGNOSIS — G309 Alzheimer's disease, unspecified: Secondary | ICD-10-CM | POA: Diagnosis not present

## 2016-02-12 DIAGNOSIS — I10 Essential (primary) hypertension: Secondary | ICD-10-CM | POA: Diagnosis not present

## 2016-02-12 DIAGNOSIS — G919 Hydrocephalus, unspecified: Secondary | ICD-10-CM | POA: Diagnosis not present

## 2016-02-12 DIAGNOSIS — Z9181 History of falling: Secondary | ICD-10-CM | POA: Diagnosis not present

## 2016-02-12 DIAGNOSIS — Z87440 Personal history of urinary (tract) infections: Secondary | ICD-10-CM | POA: Diagnosis not present

## 2016-02-12 DIAGNOSIS — Z7982 Long term (current) use of aspirin: Secondary | ICD-10-CM | POA: Diagnosis not present

## 2016-02-12 DIAGNOSIS — E785 Hyperlipidemia, unspecified: Secondary | ICD-10-CM | POA: Diagnosis not present

## 2016-02-13 ENCOUNTER — Other Ambulatory Visit: Payer: Self-pay | Admitting: Internal Medicine

## 2016-02-13 DIAGNOSIS — E785 Hyperlipidemia, unspecified: Secondary | ICD-10-CM | POA: Diagnosis not present

## 2016-02-13 DIAGNOSIS — Z87440 Personal history of urinary (tract) infections: Secondary | ICD-10-CM | POA: Diagnosis not present

## 2016-02-13 DIAGNOSIS — Z7982 Long term (current) use of aspirin: Secondary | ICD-10-CM | POA: Diagnosis not present

## 2016-02-13 DIAGNOSIS — G309 Alzheimer's disease, unspecified: Secondary | ICD-10-CM | POA: Diagnosis not present

## 2016-02-13 DIAGNOSIS — G8194 Hemiplegia, unspecified affecting left nondominant side: Secondary | ICD-10-CM | POA: Diagnosis not present

## 2016-02-13 DIAGNOSIS — Z9181 History of falling: Secondary | ICD-10-CM | POA: Diagnosis not present

## 2016-02-13 DIAGNOSIS — G919 Hydrocephalus, unspecified: Secondary | ICD-10-CM | POA: Diagnosis not present

## 2016-02-13 DIAGNOSIS — I10 Essential (primary) hypertension: Secondary | ICD-10-CM | POA: Diagnosis not present

## 2016-02-13 NOTE — Telephone Encounter (Signed)
Not familiar with this medication, ok to fill?  

## 2016-02-13 NOTE — Telephone Encounter (Signed)
Yes ok to RF 

## 2016-02-14 DIAGNOSIS — G309 Alzheimer's disease, unspecified: Secondary | ICD-10-CM | POA: Diagnosis not present

## 2016-02-14 DIAGNOSIS — F0280 Dementia in other diseases classified elsewhere without behavioral disturbance: Secondary | ICD-10-CM | POA: Diagnosis not present

## 2016-02-14 DIAGNOSIS — F0150 Vascular dementia without behavioral disturbance: Secondary | ICD-10-CM

## 2016-02-14 DIAGNOSIS — I10 Essential (primary) hypertension: Secondary | ICD-10-CM | POA: Diagnosis not present

## 2016-02-14 DIAGNOSIS — G8194 Hemiplegia, unspecified affecting left nondominant side: Secondary | ICD-10-CM | POA: Diagnosis not present

## 2016-02-14 DIAGNOSIS — G919 Hydrocephalus, unspecified: Secondary | ICD-10-CM | POA: Diagnosis not present

## 2016-02-15 DIAGNOSIS — I10 Essential (primary) hypertension: Secondary | ICD-10-CM | POA: Diagnosis not present

## 2016-02-15 DIAGNOSIS — Z9181 History of falling: Secondary | ICD-10-CM | POA: Diagnosis not present

## 2016-02-15 DIAGNOSIS — G309 Alzheimer's disease, unspecified: Secondary | ICD-10-CM | POA: Diagnosis not present

## 2016-02-15 DIAGNOSIS — G8194 Hemiplegia, unspecified affecting left nondominant side: Secondary | ICD-10-CM | POA: Diagnosis not present

## 2016-02-15 DIAGNOSIS — E785 Hyperlipidemia, unspecified: Secondary | ICD-10-CM | POA: Diagnosis not present

## 2016-02-15 DIAGNOSIS — Z87440 Personal history of urinary (tract) infections: Secondary | ICD-10-CM | POA: Diagnosis not present

## 2016-02-15 DIAGNOSIS — Z7982 Long term (current) use of aspirin: Secondary | ICD-10-CM | POA: Diagnosis not present

## 2016-02-15 DIAGNOSIS — G919 Hydrocephalus, unspecified: Secondary | ICD-10-CM | POA: Diagnosis not present

## 2016-02-20 DIAGNOSIS — G8194 Hemiplegia, unspecified affecting left nondominant side: Secondary | ICD-10-CM | POA: Diagnosis not present

## 2016-02-20 DIAGNOSIS — I10 Essential (primary) hypertension: Secondary | ICD-10-CM | POA: Diagnosis not present

## 2016-02-20 DIAGNOSIS — Z7982 Long term (current) use of aspirin: Secondary | ICD-10-CM | POA: Diagnosis not present

## 2016-02-20 DIAGNOSIS — G919 Hydrocephalus, unspecified: Secondary | ICD-10-CM | POA: Diagnosis not present

## 2016-02-20 DIAGNOSIS — Z9181 History of falling: Secondary | ICD-10-CM | POA: Diagnosis not present

## 2016-02-20 DIAGNOSIS — G309 Alzheimer's disease, unspecified: Secondary | ICD-10-CM | POA: Diagnosis not present

## 2016-02-20 DIAGNOSIS — E785 Hyperlipidemia, unspecified: Secondary | ICD-10-CM | POA: Diagnosis not present

## 2016-02-20 DIAGNOSIS — Z87440 Personal history of urinary (tract) infections: Secondary | ICD-10-CM | POA: Diagnosis not present

## 2016-02-22 DIAGNOSIS — E785 Hyperlipidemia, unspecified: Secondary | ICD-10-CM | POA: Diagnosis not present

## 2016-02-22 DIAGNOSIS — Z9181 History of falling: Secondary | ICD-10-CM | POA: Diagnosis not present

## 2016-02-22 DIAGNOSIS — Z87440 Personal history of urinary (tract) infections: Secondary | ICD-10-CM | POA: Diagnosis not present

## 2016-02-22 DIAGNOSIS — G919 Hydrocephalus, unspecified: Secondary | ICD-10-CM | POA: Diagnosis not present

## 2016-02-22 DIAGNOSIS — G8194 Hemiplegia, unspecified affecting left nondominant side: Secondary | ICD-10-CM | POA: Diagnosis not present

## 2016-02-22 DIAGNOSIS — G309 Alzheimer's disease, unspecified: Secondary | ICD-10-CM | POA: Diagnosis not present

## 2016-02-22 DIAGNOSIS — I10 Essential (primary) hypertension: Secondary | ICD-10-CM | POA: Diagnosis not present

## 2016-02-22 DIAGNOSIS — Z7982 Long term (current) use of aspirin: Secondary | ICD-10-CM | POA: Diagnosis not present

## 2016-02-26 DIAGNOSIS — G309 Alzheimer's disease, unspecified: Secondary | ICD-10-CM | POA: Diagnosis not present

## 2016-02-26 DIAGNOSIS — Z9181 History of falling: Secondary | ICD-10-CM | POA: Diagnosis not present

## 2016-02-26 DIAGNOSIS — G8194 Hemiplegia, unspecified affecting left nondominant side: Secondary | ICD-10-CM | POA: Diagnosis not present

## 2016-02-26 DIAGNOSIS — Z7982 Long term (current) use of aspirin: Secondary | ICD-10-CM | POA: Diagnosis not present

## 2016-02-26 DIAGNOSIS — E785 Hyperlipidemia, unspecified: Secondary | ICD-10-CM | POA: Diagnosis not present

## 2016-02-26 DIAGNOSIS — Z87440 Personal history of urinary (tract) infections: Secondary | ICD-10-CM | POA: Diagnosis not present

## 2016-02-26 DIAGNOSIS — G919 Hydrocephalus, unspecified: Secondary | ICD-10-CM | POA: Diagnosis not present

## 2016-02-26 DIAGNOSIS — I10 Essential (primary) hypertension: Secondary | ICD-10-CM | POA: Diagnosis not present

## 2016-02-28 ENCOUNTER — Ambulatory Visit: Payer: Medicare Other | Admitting: Internal Medicine

## 2016-02-28 DIAGNOSIS — Z87440 Personal history of urinary (tract) infections: Secondary | ICD-10-CM | POA: Diagnosis not present

## 2016-02-28 DIAGNOSIS — G8194 Hemiplegia, unspecified affecting left nondominant side: Secondary | ICD-10-CM | POA: Diagnosis not present

## 2016-02-28 DIAGNOSIS — G309 Alzheimer's disease, unspecified: Secondary | ICD-10-CM | POA: Diagnosis not present

## 2016-02-28 DIAGNOSIS — Z7982 Long term (current) use of aspirin: Secondary | ICD-10-CM | POA: Diagnosis not present

## 2016-02-28 DIAGNOSIS — G919 Hydrocephalus, unspecified: Secondary | ICD-10-CM | POA: Diagnosis not present

## 2016-02-28 DIAGNOSIS — Z9181 History of falling: Secondary | ICD-10-CM | POA: Diagnosis not present

## 2016-02-28 DIAGNOSIS — I10 Essential (primary) hypertension: Secondary | ICD-10-CM | POA: Diagnosis not present

## 2016-02-28 DIAGNOSIS — E785 Hyperlipidemia, unspecified: Secondary | ICD-10-CM | POA: Diagnosis not present

## 2016-02-29 DIAGNOSIS — E785 Hyperlipidemia, unspecified: Secondary | ICD-10-CM | POA: Diagnosis not present

## 2016-02-29 DIAGNOSIS — G309 Alzheimer's disease, unspecified: Secondary | ICD-10-CM | POA: Diagnosis not present

## 2016-02-29 DIAGNOSIS — G8194 Hemiplegia, unspecified affecting left nondominant side: Secondary | ICD-10-CM | POA: Diagnosis not present

## 2016-02-29 DIAGNOSIS — Z9181 History of falling: Secondary | ICD-10-CM | POA: Diagnosis not present

## 2016-02-29 DIAGNOSIS — Z87440 Personal history of urinary (tract) infections: Secondary | ICD-10-CM | POA: Diagnosis not present

## 2016-02-29 DIAGNOSIS — Z7982 Long term (current) use of aspirin: Secondary | ICD-10-CM | POA: Diagnosis not present

## 2016-02-29 DIAGNOSIS — G919 Hydrocephalus, unspecified: Secondary | ICD-10-CM | POA: Diagnosis not present

## 2016-02-29 DIAGNOSIS — I10 Essential (primary) hypertension: Secondary | ICD-10-CM | POA: Diagnosis not present

## 2016-03-04 DIAGNOSIS — G309 Alzheimer's disease, unspecified: Secondary | ICD-10-CM | POA: Diagnosis not present

## 2016-03-04 DIAGNOSIS — I10 Essential (primary) hypertension: Secondary | ICD-10-CM | POA: Diagnosis not present

## 2016-03-04 DIAGNOSIS — Z9181 History of falling: Secondary | ICD-10-CM | POA: Diagnosis not present

## 2016-03-04 DIAGNOSIS — Z87440 Personal history of urinary (tract) infections: Secondary | ICD-10-CM | POA: Diagnosis not present

## 2016-03-04 DIAGNOSIS — Z7982 Long term (current) use of aspirin: Secondary | ICD-10-CM | POA: Diagnosis not present

## 2016-03-04 DIAGNOSIS — G919 Hydrocephalus, unspecified: Secondary | ICD-10-CM | POA: Diagnosis not present

## 2016-03-04 DIAGNOSIS — E785 Hyperlipidemia, unspecified: Secondary | ICD-10-CM | POA: Diagnosis not present

## 2016-03-04 DIAGNOSIS — G8194 Hemiplegia, unspecified affecting left nondominant side: Secondary | ICD-10-CM | POA: Diagnosis not present

## 2016-03-05 DIAGNOSIS — Z87440 Personal history of urinary (tract) infections: Secondary | ICD-10-CM | POA: Diagnosis not present

## 2016-03-05 DIAGNOSIS — I10 Essential (primary) hypertension: Secondary | ICD-10-CM | POA: Diagnosis not present

## 2016-03-05 DIAGNOSIS — E785 Hyperlipidemia, unspecified: Secondary | ICD-10-CM | POA: Diagnosis not present

## 2016-03-05 DIAGNOSIS — Z9181 History of falling: Secondary | ICD-10-CM | POA: Diagnosis not present

## 2016-03-05 DIAGNOSIS — G919 Hydrocephalus, unspecified: Secondary | ICD-10-CM | POA: Diagnosis not present

## 2016-03-05 DIAGNOSIS — G309 Alzheimer's disease, unspecified: Secondary | ICD-10-CM | POA: Diagnosis not present

## 2016-03-05 DIAGNOSIS — Z7982 Long term (current) use of aspirin: Secondary | ICD-10-CM | POA: Diagnosis not present

## 2016-03-05 DIAGNOSIS — G8194 Hemiplegia, unspecified affecting left nondominant side: Secondary | ICD-10-CM | POA: Diagnosis not present

## 2016-03-06 DIAGNOSIS — Z7982 Long term (current) use of aspirin: Secondary | ICD-10-CM | POA: Diagnosis not present

## 2016-03-06 DIAGNOSIS — Z9181 History of falling: Secondary | ICD-10-CM | POA: Diagnosis not present

## 2016-03-06 DIAGNOSIS — Z87440 Personal history of urinary (tract) infections: Secondary | ICD-10-CM | POA: Diagnosis not present

## 2016-03-06 DIAGNOSIS — G919 Hydrocephalus, unspecified: Secondary | ICD-10-CM | POA: Diagnosis not present

## 2016-03-06 DIAGNOSIS — E785 Hyperlipidemia, unspecified: Secondary | ICD-10-CM | POA: Diagnosis not present

## 2016-03-06 DIAGNOSIS — I10 Essential (primary) hypertension: Secondary | ICD-10-CM | POA: Diagnosis not present

## 2016-03-06 DIAGNOSIS — G309 Alzheimer's disease, unspecified: Secondary | ICD-10-CM | POA: Diagnosis not present

## 2016-03-06 DIAGNOSIS — G8194 Hemiplegia, unspecified affecting left nondominant side: Secondary | ICD-10-CM | POA: Diagnosis not present

## 2016-03-07 DIAGNOSIS — Z9181 History of falling: Secondary | ICD-10-CM | POA: Diagnosis not present

## 2016-03-07 DIAGNOSIS — Z87440 Personal history of urinary (tract) infections: Secondary | ICD-10-CM | POA: Diagnosis not present

## 2016-03-07 DIAGNOSIS — Z7982 Long term (current) use of aspirin: Secondary | ICD-10-CM | POA: Diagnosis not present

## 2016-03-07 DIAGNOSIS — E785 Hyperlipidemia, unspecified: Secondary | ICD-10-CM | POA: Diagnosis not present

## 2016-03-07 DIAGNOSIS — G8194 Hemiplegia, unspecified affecting left nondominant side: Secondary | ICD-10-CM | POA: Diagnosis not present

## 2016-03-07 DIAGNOSIS — G919 Hydrocephalus, unspecified: Secondary | ICD-10-CM | POA: Diagnosis not present

## 2016-03-07 DIAGNOSIS — I10 Essential (primary) hypertension: Secondary | ICD-10-CM | POA: Diagnosis not present

## 2016-03-07 DIAGNOSIS — G309 Alzheimer's disease, unspecified: Secondary | ICD-10-CM | POA: Diagnosis not present

## 2016-03-11 DIAGNOSIS — Z87440 Personal history of urinary (tract) infections: Secondary | ICD-10-CM | POA: Diagnosis not present

## 2016-03-11 DIAGNOSIS — I10 Essential (primary) hypertension: Secondary | ICD-10-CM | POA: Diagnosis not present

## 2016-03-11 DIAGNOSIS — Z7982 Long term (current) use of aspirin: Secondary | ICD-10-CM | POA: Diagnosis not present

## 2016-03-11 DIAGNOSIS — Z9181 History of falling: Secondary | ICD-10-CM | POA: Diagnosis not present

## 2016-03-11 DIAGNOSIS — G919 Hydrocephalus, unspecified: Secondary | ICD-10-CM | POA: Diagnosis not present

## 2016-03-11 DIAGNOSIS — E785 Hyperlipidemia, unspecified: Secondary | ICD-10-CM | POA: Diagnosis not present

## 2016-03-11 DIAGNOSIS — G309 Alzheimer's disease, unspecified: Secondary | ICD-10-CM | POA: Diagnosis not present

## 2016-03-11 DIAGNOSIS — G8194 Hemiplegia, unspecified affecting left nondominant side: Secondary | ICD-10-CM | POA: Diagnosis not present

## 2016-03-12 ENCOUNTER — Other Ambulatory Visit: Payer: Self-pay | Admitting: Internal Medicine

## 2016-03-12 DIAGNOSIS — Z9181 History of falling: Secondary | ICD-10-CM | POA: Diagnosis not present

## 2016-03-12 DIAGNOSIS — I10 Essential (primary) hypertension: Secondary | ICD-10-CM | POA: Diagnosis not present

## 2016-03-12 DIAGNOSIS — Z7982 Long term (current) use of aspirin: Secondary | ICD-10-CM | POA: Diagnosis not present

## 2016-03-12 DIAGNOSIS — Z87440 Personal history of urinary (tract) infections: Secondary | ICD-10-CM | POA: Diagnosis not present

## 2016-03-12 DIAGNOSIS — G8194 Hemiplegia, unspecified affecting left nondominant side: Secondary | ICD-10-CM | POA: Diagnosis not present

## 2016-03-12 DIAGNOSIS — G309 Alzheimer's disease, unspecified: Secondary | ICD-10-CM | POA: Diagnosis not present

## 2016-03-12 DIAGNOSIS — G919 Hydrocephalus, unspecified: Secondary | ICD-10-CM | POA: Diagnosis not present

## 2016-03-12 DIAGNOSIS — E785 Hyperlipidemia, unspecified: Secondary | ICD-10-CM | POA: Diagnosis not present

## 2016-03-13 DIAGNOSIS — G8194 Hemiplegia, unspecified affecting left nondominant side: Secondary | ICD-10-CM | POA: Diagnosis not present

## 2016-03-13 DIAGNOSIS — G309 Alzheimer's disease, unspecified: Secondary | ICD-10-CM | POA: Diagnosis not present

## 2016-03-13 DIAGNOSIS — Z87440 Personal history of urinary (tract) infections: Secondary | ICD-10-CM | POA: Diagnosis not present

## 2016-03-13 DIAGNOSIS — E785 Hyperlipidemia, unspecified: Secondary | ICD-10-CM | POA: Diagnosis not present

## 2016-03-13 DIAGNOSIS — Z9181 History of falling: Secondary | ICD-10-CM | POA: Diagnosis not present

## 2016-03-13 DIAGNOSIS — G919 Hydrocephalus, unspecified: Secondary | ICD-10-CM | POA: Diagnosis not present

## 2016-03-13 DIAGNOSIS — Z7982 Long term (current) use of aspirin: Secondary | ICD-10-CM | POA: Diagnosis not present

## 2016-03-13 DIAGNOSIS — I10 Essential (primary) hypertension: Secondary | ICD-10-CM | POA: Diagnosis not present

## 2016-03-14 DIAGNOSIS — G919 Hydrocephalus, unspecified: Secondary | ICD-10-CM | POA: Diagnosis not present

## 2016-03-14 DIAGNOSIS — E785 Hyperlipidemia, unspecified: Secondary | ICD-10-CM | POA: Diagnosis not present

## 2016-03-14 DIAGNOSIS — G309 Alzheimer's disease, unspecified: Secondary | ICD-10-CM | POA: Diagnosis not present

## 2016-03-14 DIAGNOSIS — G8194 Hemiplegia, unspecified affecting left nondominant side: Secondary | ICD-10-CM | POA: Diagnosis not present

## 2016-03-14 DIAGNOSIS — Z87440 Personal history of urinary (tract) infections: Secondary | ICD-10-CM | POA: Diagnosis not present

## 2016-03-14 DIAGNOSIS — Z7982 Long term (current) use of aspirin: Secondary | ICD-10-CM | POA: Diagnosis not present

## 2016-03-14 DIAGNOSIS — I10 Essential (primary) hypertension: Secondary | ICD-10-CM | POA: Diagnosis not present

## 2016-03-14 DIAGNOSIS — Z9181 History of falling: Secondary | ICD-10-CM | POA: Diagnosis not present

## 2016-03-15 DIAGNOSIS — Z9181 History of falling: Secondary | ICD-10-CM | POA: Diagnosis not present

## 2016-03-15 DIAGNOSIS — I10 Essential (primary) hypertension: Secondary | ICD-10-CM | POA: Diagnosis not present

## 2016-03-15 DIAGNOSIS — G8194 Hemiplegia, unspecified affecting left nondominant side: Secondary | ICD-10-CM | POA: Diagnosis not present

## 2016-03-15 DIAGNOSIS — G919 Hydrocephalus, unspecified: Secondary | ICD-10-CM | POA: Diagnosis not present

## 2016-03-15 DIAGNOSIS — Z87440 Personal history of urinary (tract) infections: Secondary | ICD-10-CM | POA: Diagnosis not present

## 2016-03-15 DIAGNOSIS — Z7982 Long term (current) use of aspirin: Secondary | ICD-10-CM | POA: Diagnosis not present

## 2016-03-15 DIAGNOSIS — G309 Alzheimer's disease, unspecified: Secondary | ICD-10-CM | POA: Diagnosis not present

## 2016-03-15 DIAGNOSIS — E785 Hyperlipidemia, unspecified: Secondary | ICD-10-CM | POA: Diagnosis not present

## 2016-03-18 DIAGNOSIS — M25552 Pain in left hip: Secondary | ICD-10-CM | POA: Diagnosis not present

## 2016-03-18 DIAGNOSIS — M6281 Muscle weakness (generalized): Secondary | ICD-10-CM | POA: Diagnosis not present

## 2016-03-18 DIAGNOSIS — R2681 Unsteadiness on feet: Secondary | ICD-10-CM | POA: Diagnosis not present

## 2016-03-19 DIAGNOSIS — R278 Other lack of coordination: Secondary | ICD-10-CM | POA: Diagnosis not present

## 2016-03-19 DIAGNOSIS — M6281 Muscle weakness (generalized): Secondary | ICD-10-CM | POA: Diagnosis not present

## 2016-03-20 DIAGNOSIS — M25552 Pain in left hip: Secondary | ICD-10-CM | POA: Diagnosis not present

## 2016-03-20 DIAGNOSIS — R2681 Unsteadiness on feet: Secondary | ICD-10-CM | POA: Diagnosis not present

## 2016-03-20 DIAGNOSIS — M6281 Muscle weakness (generalized): Secondary | ICD-10-CM | POA: Diagnosis not present

## 2016-03-21 DIAGNOSIS — R2681 Unsteadiness on feet: Secondary | ICD-10-CM | POA: Diagnosis not present

## 2016-03-21 DIAGNOSIS — M6281 Muscle weakness (generalized): Secondary | ICD-10-CM | POA: Diagnosis not present

## 2016-03-21 DIAGNOSIS — M25552 Pain in left hip: Secondary | ICD-10-CM | POA: Diagnosis not present

## 2016-03-22 DIAGNOSIS — R278 Other lack of coordination: Secondary | ICD-10-CM | POA: Diagnosis not present

## 2016-03-22 DIAGNOSIS — M6281 Muscle weakness (generalized): Secondary | ICD-10-CM | POA: Diagnosis not present

## 2016-03-25 DIAGNOSIS — R2681 Unsteadiness on feet: Secondary | ICD-10-CM | POA: Diagnosis not present

## 2016-03-25 DIAGNOSIS — M6281 Muscle weakness (generalized): Secondary | ICD-10-CM | POA: Diagnosis not present

## 2016-03-25 DIAGNOSIS — M25552 Pain in left hip: Secondary | ICD-10-CM | POA: Diagnosis not present

## 2016-03-26 DIAGNOSIS — R278 Other lack of coordination: Secondary | ICD-10-CM | POA: Diagnosis not present

## 2016-03-26 DIAGNOSIS — M6281 Muscle weakness (generalized): Secondary | ICD-10-CM | POA: Diagnosis not present

## 2016-03-27 ENCOUNTER — Ambulatory Visit: Payer: Medicare Other | Admitting: Internal Medicine

## 2016-03-27 DIAGNOSIS — M6281 Muscle weakness (generalized): Secondary | ICD-10-CM | POA: Diagnosis not present

## 2016-03-27 DIAGNOSIS — R2681 Unsteadiness on feet: Secondary | ICD-10-CM | POA: Diagnosis not present

## 2016-03-27 DIAGNOSIS — M25552 Pain in left hip: Secondary | ICD-10-CM | POA: Diagnosis not present

## 2016-03-28 DIAGNOSIS — M6281 Muscle weakness (generalized): Secondary | ICD-10-CM | POA: Diagnosis not present

## 2016-03-28 DIAGNOSIS — R2681 Unsteadiness on feet: Secondary | ICD-10-CM | POA: Diagnosis not present

## 2016-03-28 DIAGNOSIS — M25552 Pain in left hip: Secondary | ICD-10-CM | POA: Diagnosis not present

## 2016-03-29 DIAGNOSIS — R278 Other lack of coordination: Secondary | ICD-10-CM | POA: Diagnosis not present

## 2016-03-29 DIAGNOSIS — M6281 Muscle weakness (generalized): Secondary | ICD-10-CM | POA: Diagnosis not present

## 2016-04-01 DIAGNOSIS — M25552 Pain in left hip: Secondary | ICD-10-CM | POA: Diagnosis not present

## 2016-04-01 DIAGNOSIS — R2681 Unsteadiness on feet: Secondary | ICD-10-CM | POA: Diagnosis not present

## 2016-04-01 DIAGNOSIS — M6281 Muscle weakness (generalized): Secondary | ICD-10-CM | POA: Diagnosis not present

## 2016-04-02 DIAGNOSIS — M6281 Muscle weakness (generalized): Secondary | ICD-10-CM | POA: Diagnosis not present

## 2016-04-02 DIAGNOSIS — R278 Other lack of coordination: Secondary | ICD-10-CM | POA: Diagnosis not present

## 2016-04-03 DIAGNOSIS — R2681 Unsteadiness on feet: Secondary | ICD-10-CM | POA: Diagnosis not present

## 2016-04-03 DIAGNOSIS — M25552 Pain in left hip: Secondary | ICD-10-CM | POA: Diagnosis not present

## 2016-04-03 DIAGNOSIS — M6281 Muscle weakness (generalized): Secondary | ICD-10-CM | POA: Diagnosis not present

## 2016-04-04 DIAGNOSIS — R2681 Unsteadiness on feet: Secondary | ICD-10-CM | POA: Diagnosis not present

## 2016-04-04 DIAGNOSIS — M6281 Muscle weakness (generalized): Secondary | ICD-10-CM | POA: Diagnosis not present

## 2016-04-04 DIAGNOSIS — M25552 Pain in left hip: Secondary | ICD-10-CM | POA: Diagnosis not present

## 2016-04-05 DIAGNOSIS — R278 Other lack of coordination: Secondary | ICD-10-CM | POA: Diagnosis not present

## 2016-04-05 DIAGNOSIS — M6281 Muscle weakness (generalized): Secondary | ICD-10-CM | POA: Diagnosis not present

## 2016-04-08 DIAGNOSIS — R2681 Unsteadiness on feet: Secondary | ICD-10-CM | POA: Diagnosis not present

## 2016-04-08 DIAGNOSIS — M25552 Pain in left hip: Secondary | ICD-10-CM | POA: Diagnosis not present

## 2016-04-08 DIAGNOSIS — M6281 Muscle weakness (generalized): Secondary | ICD-10-CM | POA: Diagnosis not present

## 2016-04-09 DIAGNOSIS — R278 Other lack of coordination: Secondary | ICD-10-CM | POA: Diagnosis not present

## 2016-04-09 DIAGNOSIS — M6281 Muscle weakness (generalized): Secondary | ICD-10-CM | POA: Diagnosis not present

## 2016-04-10 ENCOUNTER — Encounter: Payer: Self-pay | Admitting: Internal Medicine

## 2016-04-10 ENCOUNTER — Ambulatory Visit (INDEPENDENT_AMBULATORY_CARE_PROVIDER_SITE_OTHER): Payer: Medicare Other | Admitting: Internal Medicine

## 2016-04-10 VITALS — BP 112/70 | HR 61 | Temp 98.1°F | Ht 64.0 in | Wt 128.0 lb

## 2016-04-10 DIAGNOSIS — F0150 Vascular dementia without behavioral disturbance: Secondary | ICD-10-CM | POA: Diagnosis not present

## 2016-04-10 DIAGNOSIS — I10 Essential (primary) hypertension: Secondary | ICD-10-CM | POA: Diagnosis not present

## 2016-04-10 DIAGNOSIS — E785 Hyperlipidemia, unspecified: Secondary | ICD-10-CM | POA: Diagnosis not present

## 2016-04-10 DIAGNOSIS — M25552 Pain in left hip: Secondary | ICD-10-CM

## 2016-04-10 DIAGNOSIS — R296 Repeated falls: Secondary | ICD-10-CM | POA: Diagnosis not present

## 2016-04-10 DIAGNOSIS — F0280 Dementia in other diseases classified elsewhere without behavioral disturbance: Secondary | ICD-10-CM | POA: Diagnosis not present

## 2016-04-10 DIAGNOSIS — J302 Other seasonal allergic rhinitis: Secondary | ICD-10-CM | POA: Diagnosis not present

## 2016-04-10 DIAGNOSIS — G8194 Hemiplegia, unspecified affecting left nondominant side: Secondary | ICD-10-CM

## 2016-04-10 DIAGNOSIS — G309 Alzheimer's disease, unspecified: Secondary | ICD-10-CM | POA: Diagnosis not present

## 2016-04-10 DIAGNOSIS — R2681 Unsteadiness on feet: Secondary | ICD-10-CM | POA: Diagnosis not present

## 2016-04-10 DIAGNOSIS — M6281 Muscle weakness (generalized): Secondary | ICD-10-CM | POA: Diagnosis not present

## 2016-04-10 DIAGNOSIS — R278 Other lack of coordination: Secondary | ICD-10-CM | POA: Diagnosis not present

## 2016-04-10 LAB — BASIC METABOLIC PANEL WITH GFR
BUN: 23 mg/dL (ref 7–25)
CHLORIDE: 106 mmol/L (ref 98–110)
CO2: 23 mmol/L (ref 20–31)
Calcium: 9.2 mg/dL (ref 8.6–10.4)
Creat: 1.2 mg/dL — ABNORMAL HIGH (ref 0.60–0.88)
GFR, EST NON AFRICAN AMERICAN: 40 mL/min — AB (ref >=60)
GFR, Est African American: 46 mL/min — ABNORMAL LOW (ref >=60)
Glucose, Bld: 104 mg/dL — ABNORMAL HIGH (ref 65–99)
Potassium: 4.6 mmol/L (ref 3.5–5.3)
SODIUM: 140 mmol/L (ref 135–146)

## 2016-04-10 LAB — LIPID PANEL
Cholesterol: 227 mg/dL — ABNORMAL HIGH (ref 125–200)
HDL: 45 mg/dL — AB (ref >=46)
LDL CALC: 138 mg/dL — AB (ref <130)
TRIGLYCERIDES: 218 mg/dL — AB (ref <150)
Total CHOL/HDL Ratio: 5 Ratio (ref <=5.0)
VLDL: 44 mg/dL — AB (ref <30)

## 2016-04-10 LAB — TSH: TSH: 1.84 mIU/L

## 2016-04-10 LAB — ALT: ALT: 13 U/L (ref 6–29)

## 2016-04-10 MED ORDER — CETIRIZINE HCL 5 MG PO TABS: 5.0000 mg | ORAL_TABLET | Freq: Every day | ORAL | 0 refills | Status: DC

## 2016-04-10 NOTE — Progress Notes (Signed)
Patient ID: Beverly Barnett, female   DOB: 05-Jul-1923, 82 y.o.   MRN: [id number]    Location:  [facility] Place of Service: OFFICE  Chief Complaint  Patient presents with  . Follow-up    Follow-up on blood pressure     HPI:  82 yo female seen today for f/u. She saw neurology Dr Leonie Man for abnormal brain MRI/A. He dx her with probable stroke despite not visualized on MRI and her sx's of left hemiparesis. He did not see any signs of NPH on exam. MRI revealed chronic microvascular disease and enlarged ventricles. She is getting PT but appears to be getting weaker on left side despite strengthening exercises. She has begun to lean more to left again and gait is very unsteady. She continues to live in ALF  Dementia - better on Aricept. She takes melatonin to help sleep. She takes vayacog daily. Weight is stable and has gained 5 lb since last OV  HTN - BP at [place] at [facility] in the past have been 130-140s/60-70s usually and occasionally SBP>150. She takes losartan and metoprolol. She did not bring BP diary today  Unsteady gait/left hemiparesis - worsening per daughter despite PT. She cannot remember to do home exercises. She attends exercise daily  Hyperlipidemia - stable on colestipol. LDL 131; Total chol 235; TG 270; HDL 50  Rash - unchanged on triamcinolone cream and hydrocortisone cream  Constipation - stable on floraster   Past Medical History:  Diagnosis Date  . Cardiac catheterisation as the cause of abnormal reaction of patient, or of later complication 19/50/9326  . Dysphagia, unspecified(787.20)   . Encounter for long-term (current) use of other medications   . High cholesterol   . Hyperosmolality and/or hypernatremia   . Hypertension   . Osteoporosis   . Other atopic dermatitis and related conditions   . Type II or unspecified type diabetes mellitus without mention of complication, not stated as uncontrolled   . Vascular dementia 06/22/2013    Past Surgical History:    Procedure Laterality Date  . FEMUR IM NAIL  07/24/2011   Procedure: INTRAMEDULLARY (IM) NAIL FEMORAL;  Surgeon: Johnn Hai, MD;  Location: [facility];  Service: Orthopedics;  Laterality: Right;  Biomet, c-arm, Jackson table  . FEMUR SURGERY Right 03/2010  . FRACTURE SURGERY Left    Hip, Leg and shoulder  . INTRAMEDULLARY (IM) NAIL INTERTROCHANTERIC Left 07/30/2013   Procedure: INTRAMEDULLARY (IM) NAIL INTERTROCHANTRIC LEFT;  Surgeon: Mauri Pole, MD;  Location: [facility];  Service: Orthopedics;  Laterality: Left;  . SHOULDER SURGERY Left 03/2010    Patient Care Team: Gildardo Cranker, DO as PCP - General (Internal Medicine)  Social History   Social History  . Marital status: Widowed    Spouse name: N/A  . Number of children: N/A  . Years of education: N/A   Occupational History  . Not on file.   Social History Main Topics  . Smoking status: Never Smoker  . Smokeless tobacco: Never Used  . Alcohol use 0.6 oz/week    1 Glasses of wine per week     Comment: occasionally  . Drug use: No  . Sexual activity: Not on file   Other Topics Concern  . Not on file   Social History Narrative  . No narrative on file     reports that she has never smoked. She has never used smokeless tobacco. She reports that she drinks about 0.6 oz of alcohol per week . She reports that she  does not use drugs.  Family History  Problem Relation Age of Onset  . Cancer Sister   . Heart disease Father    Family Status  Relation Status  . Sister Deceased  . Mother Deceased at age 38  . Father Deceased at age 16  . Sister Deceased  . Brother Deceased  . Daughter Alive  . Son Alive  . Brother Alive  . Son Alive     No Known Allergies  Medications: Patient's Medications  New Prescriptions   No medications on file  Previous Medications   ACETAMINOPHEN (TYLENOL) 500 MG TABLET    Take 1 tablet (500 mg total) by mouth 3 (three) times daily. For one week, then tid prn thereafter   ASPIRIN EC  81 MG TABLET    Take 1 tablet (81 mg total) by mouth daily.   BENZONATATE (TESSALON) 100 MG CAPSULE    Take 1 capsule (100 mg total) by mouth every 8 (eight) hours.   COENZYME Q10 (CO Q 10) 100 MG CAPS    Take 1 tablet by mouth 2 (two) times daily.   COLESTIPOL (COLESTID) 1 G TABLET    Take 1 tablet (1 g total) by mouth daily.   DIPHENOXYLATE-ATROPINE (LOMOTIL) 2.5-0.025 MG PER TABLET    Take one tablet by mouth three times daily as needed for diarrhea (control)   DONEPEZIL (ARICEPT) 10 MG TABLET    Take 10 mg by mouth at bedtime.   LOSARTAN (COZAAR) 100 MG TABLET    Take 100 mg by mouth daily.   MELATONIN 3 MG CAPS    Take 1 capsule (3 mg total) by mouth at bedtime and may repeat dose one time if needed.   METOPROLOL TARTRATE (LOPRESSOR) 25 MG TABLET    Take 25 mg by mouth 2 (two) times daily.   MULTIPLE VITAMIN (MULITIVITAMIN WITH MINERALS) TABS    Take 1 tablet by mouth daily.    SACCHAROMYCES BOULARDII (FLORASTOR) 250 MG CAPSULE    Take 1 capsule (250 mg total) by mouth 2 (two) times daily.   TRIAMCINOLONE ACETONIDE (TRIAMCINOLONE 0.1 % CREAM : EUCERIN) CREA    Apply 1 application topically daily as needed for rash (on face).   VAYACOG 100-19.5-6.5 MG CAPS    TAKE 1 CAPSULE EVERY DAY.  Modified Medications   No medications on file  Discontinued Medications   NEOMYCIN-BACITRACIN-POLYMYXIN (NEOSPORIN) OINTMENT    Apply 1 application topically daily. apply to eye    Review of Systems  Unable to perform ROS: Dementia    Vitals:   04/10/16 1435  BP: 112/70  Pulse: 61  Temp: 98.1 F (36.7 C)  SpO2: 95%  Weight: 128 lb (58.1 kg)  Height: 5' 4"  (1.626 m)   Body mass index is 21.97 kg/m.  Physical Exam  Constitutional: She appears well-developed.  Frail appearing in NAD  HENT:  Mouth/Throat: Oropharynx is clear and moist. No oropharyngeal exudate.  Eyes: Pupils are equal, round, and reactive to light. No scleral icterus.  Neck: Neck supple. Carotid bruit is not present. No  tracheal deviation present. No thyromegaly present.  Cardiovascular: Normal rate, regular rhythm and intact distal pulses.  Exam reveals no gallop and no friction rub.   Murmur (1/6 SEM) heard. No LE edema b/l. no calf TTP.   Pulmonary/Chest: Effort normal and breath sounds normal. No stridor. No respiratory distress. She has no wheezes. She has no rales.  Abdominal: Soft. Bowel sounds are normal. She exhibits no distension and no mass. There is  no hepatomegaly. There is no tenderness. There is no rebound and no guarding.  Musculoskeletal: She exhibits edema and tenderness.  Thoracic kyphoscoliosis; poor posture; L>R reduced internal hip rotation and flexion of hip  Lymphadenopathy:    She has no cervical adenopathy.  Neurological: She is alert. Gait (slow and shuffling) abnormal.  Reduced CN 12 but other CN grossly intact; grip strength 4/5 on left; strength 4/5 b/l LE; speech slurred  Skin: Skin is warm and dry. No rash noted.  Psychiatric: She has a normal mood and affect. Her behavior is normal. Her speech is delayed and slurred. Cognition and memory are impaired.     Labs reviewed: Admission on 02/08/2016, Discharged on 02/09/2016  Component Date Value Ref Range Status  . Fecal Occult Bld 02/08/2016 NEGATIVE  NEGATIVE Final  . Sodium 02/08/2016 141  135 - 145 mmol/L Final  . Potassium 02/08/2016 4.4  3.5 - 5.1 mmol/L Final  . Chloride 02/08/2016 109  101 - 111 mmol/L Final  . CO2 02/08/2016 25  22 - 32 mmol/L Final  . Glucose, Bld 02/08/2016 105* 65 - 99 mg/dL Final  . BUN 02/08/2016 28* 6 - 20 mg/dL Final  . Creatinine, Ser 02/08/2016 0.87  0.44 - 1.00 mg/dL Final  . Calcium 02/08/2016 9.4  8.9 - 10.3 mg/dL Final  . GFR calc non Af Amer 02/08/2016 57* >60 mL/min Final  . GFR calc Af Amer 02/08/2016 >60  >60 mL/min Final   Comment: (NOTE) The eGFR has been calculated using the CKD EPI equation. This calculation has not been validated in all clinical situations. eGFR's  persistently <60 mL/min signify possible Chronic Kidney Disease.   . Anion gap 02/08/2016 7  5 - 15 Final  . WBC 02/08/2016 7.9  4.0 - 10.5 K/uL Final  . RBC 02/08/2016 4.16  3.87 - 5.11 MIL/uL Final  . Hemoglobin 02/08/2016 12.8  12.0 - 15.0 g/dL Final  . HCT 02/08/2016 38.4  36.0 - 46.0 % Final  . MCV 02/08/2016 92.3  78.0 - 100.0 fL Final  . MCH 02/08/2016 30.8  26.0 - 34.0 pg Final  . MCHC 02/08/2016 33.3  30.0 - 36.0 g/dL Final  . RDW 02/08/2016 13.5  11.5 - 15.5 % Final  . Platelets 02/08/2016 178  150 - 400 K/uL Final  . Neutrophils Relative % 02/08/2016 46  % Final  . Neutro Abs 02/08/2016 3.7  1.7 - 7.7 K/uL Final  . Lymphocytes Relative 02/08/2016 35  % Final  . Lymphs Abs 02/08/2016 2.7  0.7 - 4.0 K/uL Final  . Monocytes Relative 02/08/2016 13  % Final  . Monocytes Absolute 02/08/2016 1.0  0.1 - 1.0 K/uL Final  . Eosinophils Relative 02/08/2016 5  % Final  . Eosinophils Absolute 02/08/2016 0.4  0.0 - 0.7 K/uL Final  . Basophils Relative 02/08/2016 1  % Final  . Basophils Absolute 02/08/2016 0.0  0.0 - 0.1 K/uL Final  . Color, Urine 02/08/2016 YELLOW  YELLOW Final  . APPearance 02/08/2016 CLEAR  CLEAR Final  . Specific Gravity, Urine 02/08/2016 1.009  1.005 - 1.030 Final  . pH 02/08/2016 6.0  5.0 - 8.0 Final  . Glucose, UA 02/08/2016 NEGATIVE  NEGATIVE mg/dL Final  . Hgb urine dipstick 02/08/2016 LARGE* NEGATIVE Final  . Bilirubin Urine 02/08/2016 NEGATIVE  NEGATIVE Final  . Ketones, ur 02/08/2016 NEGATIVE  NEGATIVE mg/dL Final  . Protein, ur 02/08/2016 NEGATIVE  NEGATIVE mg/dL Final  . Nitrite 02/08/2016 NEGATIVE  NEGATIVE Final  . Leukocytes, UA 02/08/2016  NEGATIVE  NEGATIVE Final  . Squamous Epithelial / LPF 02/08/2016 0-5* NONE SEEN Final  . WBC, UA 02/08/2016 0-5  0 - 5 WBC/hpf Final  . RBC / HPF 02/08/2016 0-5  0 - 5 RBC/hpf Final  . Bacteria, UA 02/08/2016 FEW* NONE SEEN Final  Admission on 02/04/2016, Discharged on 02/04/2016  Component Date Value Ref Range  Status  . Color, Urine 02/04/2016 YELLOW  YELLOW Final  . APPearance 02/04/2016 CLOUDY* CLEAR Final  . Specific Gravity, Urine 02/04/2016 1.018  1.005 - 1.030 Final  . pH 02/04/2016 5.5  5.0 - 8.0 Final  . Glucose, UA 02/04/2016 NEGATIVE  NEGATIVE mg/dL Final  . Hgb urine dipstick 02/04/2016 [place]* NEGATIVE Final  . Bilirubin Urine 02/04/2016 NEGATIVE  NEGATIVE Final  . Ketones, ur 02/04/2016 NEGATIVE  NEGATIVE mg/dL Final  . Protein, ur 02/04/2016 NEGATIVE  NEGATIVE mg/dL Final  . Nitrite 02/04/2016 POSITIVE* NEGATIVE Final  . Leukocytes, UA 02/04/2016 MODERATE* NEGATIVE Final  . Specimen Description 02/06/2016 URINE, CATHETERIZED   Final  . Special Requests 02/06/2016 NONE   Final  . Culture 02/06/2016 >=100,000 COLONIES/mL KLEBSIELLA OXYTOCA*  Final  . Report Status 02/06/2016 02/06/2016 FINAL   Final  . Organism ID, Bacteria 02/06/2016 KLEBSIELLA OXYTOCA*  Final  . Sodium 02/04/2016 142  135 - 145 mmol/L Final  . Potassium 02/04/2016 4.3  3.5 - 5.1 mmol/L Final  . Chloride 02/04/2016 112* 101 - 111 mmol/L Final  . CO2 02/04/2016 23  22 - 32 mmol/L Final  . Glucose, Bld 02/04/2016 109* 65 - 99 mg/dL Final  . BUN 02/04/2016 31* 6 - 20 mg/dL Final  . Creatinine, Ser 02/04/2016 1.00  0.44 - 1.00 mg/dL Final  . Calcium 02/04/2016 9.1  8.9 - 10.3 mg/dL Final  . GFR calc non Af Amer 02/04/2016 48* >60 mL/min Final  . GFR calc Af Amer 02/04/2016 56* >60 mL/min Final   Comment: (NOTE) The eGFR has been calculated using the CKD EPI equation. This calculation has not been validated in all clinical situations. eGFR's persistently <60 mL/min signify possible Chronic Kidney Disease.   . Anion gap 02/04/2016 7  5 - 15 Final  . WBC 02/04/2016 7.4  4.0 - 10.5 K/uL Final  . RBC 02/04/2016 3.88  3.87 - 5.11 MIL/uL Final  . Hemoglobin 02/04/2016 12.1  12.0 - 15.0 g/dL Final  . HCT 02/04/2016 36.6  36.0 - 46.0 % Final  . MCV 02/04/2016 94.3  78.0 - 100.0 fL Final  . MCH 02/04/2016 31.2  26.0 -  34.0 pg Final  . MCHC 02/04/2016 33.1  30.0 - 36.0 g/dL Final  . RDW 02/04/2016 14.0  11.5 - 15.5 % Final  . Platelets 02/04/2016 182  150 - 400 K/uL Final  . Neutrophils Relative % 02/04/2016 57  % Final  . Neutro Abs 02/04/2016 4.2  1.7 - 7.7 K/uL Final  . Lymphocytes Relative 02/04/2016 28  % Final  . Lymphs Abs 02/04/2016 2.1  0.7 - 4.0 K/uL Final  . Monocytes Relative 02/04/2016 12  % Final  . Monocytes Absolute 02/04/2016 0.9  0.1 - 1.0 K/uL Final  . Eosinophils Relative 02/04/2016 3  % Final  . Eosinophils Absolute 02/04/2016 0.2  0.0 - 0.7 K/uL Final  . Basophils Relative 02/04/2016 0  % Final  . Basophils Absolute 02/04/2016 0.0  0.0 - 0.1 K/uL Final  . Squamous Epithelial / LPF 02/04/2016 NONE SEEN  NONE SEEN Final  . WBC, UA 02/04/2016 6-30  0 - 5 WBC/hpf Final  .  RBC / HPF 02/04/2016 NONE SEEN  0 - 5 RBC/hpf Final  . Bacteria, UA 02/04/2016 MANY* NONE SEEN Final  . Urine-Other 02/04/2016 LESS THAN 10 mL OF URINE SUBMITTED   Final    No results found.   Assessment/Plan   ICD-9-CM ICD-10-CM   1. Left hemiparesis ([place]) 342.90 G81.94 BMP with eGFR     ALT  2. Hyperlipidemia with target LDL less than 130 272.4 E78.5 Lipid Panel     TSH  3. Mixed Alzheimer's and vascular dementia 331.0 G30.9 BMP with eGFR   294.10 F01.50 ALT   290.40 F02.80   4. Essential hypertension, benign 401.1 I10 BMP with eGFR     ALT  5. Unsteady gait 781.2 R26.81   6. Left hip pain 719.45 M25.552    with hx left hip fx  7. Frequent falls V15.88 R29.6   8. Seasonal allergic rhinitis, unspecified chronicity, unspecified trigger 477.9 J30.2     Will hold off carotid study as family does not desire aggressive imaging at this time and do not feel she will be a good surgical candidate  Recommend follow up with neurology for left hemiparesis and dementia  Add zyrtec 1m daily x 4 weeks for seasonal allergy  Continue other medications as ordered  Follow up in 3 mos for routine visit  Beverly Barnett  S. CPerlie Gold PWhitewater Surgery Center LLCand Adult Medicine [address]GE[address] [place] [phone] (Monday-Friday 8 AM - 5 PM) ([phone] 5 PM and follow prompts

## 2016-04-10 NOTE — Patient Instructions (Signed)
Recommend follow up with neurology for left hemiparesis and dementia  Add zyrtec 5mg  daily x 4 weeks for seasonal allergy  Continue other medications as ordered  Follow up in 3 mos for routine visit

## 2016-04-11 DIAGNOSIS — R2681 Unsteadiness on feet: Secondary | ICD-10-CM | POA: Diagnosis not present

## 2016-04-11 DIAGNOSIS — M25552 Pain in left hip: Secondary | ICD-10-CM | POA: Diagnosis not present

## 2016-04-11 DIAGNOSIS — M6281 Muscle weakness (generalized): Secondary | ICD-10-CM | POA: Diagnosis not present

## 2016-04-12 DIAGNOSIS — M6281 Muscle weakness (generalized): Secondary | ICD-10-CM | POA: Diagnosis not present

## 2016-04-12 DIAGNOSIS — R278 Other lack of coordination: Secondary | ICD-10-CM | POA: Diagnosis not present

## 2016-04-13 ENCOUNTER — Other Ambulatory Visit: Payer: Self-pay | Admitting: Internal Medicine

## 2016-04-15 ENCOUNTER — Other Ambulatory Visit: Payer: Self-pay | Admitting: *Deleted

## 2016-04-15 DIAGNOSIS — M25552 Pain in left hip: Secondary | ICD-10-CM | POA: Diagnosis not present

## 2016-04-15 DIAGNOSIS — M6281 Muscle weakness (generalized): Secondary | ICD-10-CM | POA: Diagnosis not present

## 2016-04-15 DIAGNOSIS — R2681 Unsteadiness on feet: Secondary | ICD-10-CM | POA: Diagnosis not present

## 2016-04-15 MED ORDER — DIPHENOXYLATE-ATROPINE 2.5-0.025 MG PO TABS: ORAL_TABLET | ORAL | 0 refills | Status: DC

## 2016-04-15 NOTE — Telephone Encounter (Signed)
[place] [facility]

## 2016-04-16 DIAGNOSIS — M6281 Muscle weakness (generalized): Secondary | ICD-10-CM | POA: Diagnosis not present

## 2016-04-16 DIAGNOSIS — R278 Other lack of coordination: Secondary | ICD-10-CM | POA: Diagnosis not present

## 2016-04-17 DIAGNOSIS — R2681 Unsteadiness on feet: Secondary | ICD-10-CM | POA: Diagnosis not present

## 2016-04-17 DIAGNOSIS — M25552 Pain in left hip: Secondary | ICD-10-CM | POA: Diagnosis not present

## 2016-04-17 DIAGNOSIS — M6281 Muscle weakness (generalized): Secondary | ICD-10-CM | POA: Diagnosis not present

## 2016-04-18 DIAGNOSIS — M6281 Muscle weakness (generalized): Secondary | ICD-10-CM | POA: Diagnosis not present

## 2016-04-18 DIAGNOSIS — R2681 Unsteadiness on feet: Secondary | ICD-10-CM | POA: Diagnosis not present

## 2016-04-18 DIAGNOSIS — M25552 Pain in left hip: Secondary | ICD-10-CM | POA: Diagnosis not present

## 2016-04-19 DIAGNOSIS — M6281 Muscle weakness (generalized): Secondary | ICD-10-CM | POA: Diagnosis not present

## 2016-04-19 DIAGNOSIS — R278 Other lack of coordination: Secondary | ICD-10-CM | POA: Diagnosis not present

## 2016-04-22 DIAGNOSIS — M6281 Muscle weakness (generalized): Secondary | ICD-10-CM | POA: Diagnosis not present

## 2016-04-22 DIAGNOSIS — R2681 Unsteadiness on feet: Secondary | ICD-10-CM | POA: Diagnosis not present

## 2016-04-22 DIAGNOSIS — M25552 Pain in left hip: Secondary | ICD-10-CM | POA: Diagnosis not present

## 2016-04-23 DIAGNOSIS — R278 Other lack of coordination: Secondary | ICD-10-CM | POA: Diagnosis not present

## 2016-04-23 DIAGNOSIS — M6281 Muscle weakness (generalized): Secondary | ICD-10-CM | POA: Diagnosis not present

## 2016-04-24 DIAGNOSIS — M25552 Pain in left hip: Secondary | ICD-10-CM | POA: Diagnosis not present

## 2016-04-24 DIAGNOSIS — R2681 Unsteadiness on feet: Secondary | ICD-10-CM | POA: Diagnosis not present

## 2016-04-24 DIAGNOSIS — M6281 Muscle weakness (generalized): Secondary | ICD-10-CM | POA: Diagnosis not present

## 2016-04-25 ENCOUNTER — Telehealth: Payer: Self-pay | Admitting: *Deleted

## 2016-04-25 DIAGNOSIS — M6281 Muscle weakness (generalized): Secondary | ICD-10-CM | POA: Diagnosis not present

## 2016-04-25 DIAGNOSIS — R278 Other lack of coordination: Secondary | ICD-10-CM | POA: Diagnosis not present

## 2016-04-25 NOTE — Telephone Encounter (Signed)
[facility] [place] [facility][phone] [facility] Gave form to Dr. Montez Moritaarter with last OV note. [facility] Fax: [phone]

## 2016-04-26 DIAGNOSIS — M6281 Muscle weakness (generalized): Secondary | ICD-10-CM | POA: Diagnosis not present

## 2016-04-26 DIAGNOSIS — M25552 Pain in left hip: Secondary | ICD-10-CM | POA: Diagnosis not present

## 2016-04-26 DIAGNOSIS — R2681 Unsteadiness on feet: Secondary | ICD-10-CM | POA: Diagnosis not present

## 2016-04-29 DIAGNOSIS — R278 Other lack of coordination: Secondary | ICD-10-CM | POA: Diagnosis not present

## 2016-04-29 DIAGNOSIS — M6281 Muscle weakness (generalized): Secondary | ICD-10-CM | POA: Diagnosis not present

## 2016-04-30 DIAGNOSIS — R278 Other lack of coordination: Secondary | ICD-10-CM | POA: Diagnosis not present

## 2016-04-30 DIAGNOSIS — M6281 Muscle weakness (generalized): Secondary | ICD-10-CM | POA: Diagnosis not present

## 2016-05-01 DIAGNOSIS — M6281 Muscle weakness (generalized): Secondary | ICD-10-CM | POA: Diagnosis not present

## 2016-05-01 DIAGNOSIS — M25552 Pain in left hip: Secondary | ICD-10-CM | POA: Diagnosis not present

## 2016-05-01 DIAGNOSIS — R2681 Unsteadiness on feet: Secondary | ICD-10-CM | POA: Diagnosis not present

## 2016-05-06 DIAGNOSIS — R2681 Unsteadiness on feet: Secondary | ICD-10-CM | POA: Diagnosis not present

## 2016-05-06 DIAGNOSIS — M25552 Pain in left hip: Secondary | ICD-10-CM | POA: Diagnosis not present

## 2016-05-06 DIAGNOSIS — M6281 Muscle weakness (generalized): Secondary | ICD-10-CM | POA: Diagnosis not present

## 2016-05-08 DIAGNOSIS — R278 Other lack of coordination: Secondary | ICD-10-CM | POA: Diagnosis not present

## 2016-05-08 DIAGNOSIS — M6281 Muscle weakness (generalized): Secondary | ICD-10-CM | POA: Diagnosis not present

## 2016-05-10 DIAGNOSIS — M25552 Pain in left hip: Secondary | ICD-10-CM | POA: Diagnosis not present

## 2016-05-10 DIAGNOSIS — R2681 Unsteadiness on feet: Secondary | ICD-10-CM | POA: Diagnosis not present

## 2016-05-10 DIAGNOSIS — M6281 Muscle weakness (generalized): Secondary | ICD-10-CM | POA: Diagnosis not present

## 2016-05-13 DIAGNOSIS — M6281 Muscle weakness (generalized): Secondary | ICD-10-CM | POA: Diagnosis not present

## 2016-05-13 DIAGNOSIS — R2681 Unsteadiness on feet: Secondary | ICD-10-CM | POA: Diagnosis not present

## 2016-05-13 DIAGNOSIS — M25552 Pain in left hip: Secondary | ICD-10-CM | POA: Diagnosis not present

## 2016-05-14 DIAGNOSIS — R278 Other lack of coordination: Secondary | ICD-10-CM | POA: Diagnosis not present

## 2016-05-14 DIAGNOSIS — M6281 Muscle weakness (generalized): Secondary | ICD-10-CM | POA: Diagnosis not present

## 2016-05-15 DIAGNOSIS — M25552 Pain in left hip: Secondary | ICD-10-CM | POA: Diagnosis not present

## 2016-05-15 DIAGNOSIS — M6281 Muscle weakness (generalized): Secondary | ICD-10-CM | POA: Diagnosis not present

## 2016-05-15 DIAGNOSIS — R2681 Unsteadiness on feet: Secondary | ICD-10-CM | POA: Diagnosis not present

## 2016-05-16 DIAGNOSIS — R2681 Unsteadiness on feet: Secondary | ICD-10-CM | POA: Diagnosis not present

## 2016-05-16 DIAGNOSIS — M6281 Muscle weakness (generalized): Secondary | ICD-10-CM | POA: Diagnosis not present

## 2016-05-16 DIAGNOSIS — M25552 Pain in left hip: Secondary | ICD-10-CM | POA: Diagnosis not present

## 2016-05-17 ENCOUNTER — Ambulatory Visit (INDEPENDENT_AMBULATORY_CARE_PROVIDER_SITE_OTHER): Payer: Medicare Other | Admitting: Internal Medicine

## 2016-05-17 ENCOUNTER — Encounter: Payer: Self-pay | Admitting: Internal Medicine

## 2016-05-17 VITALS — BP 118/78 | HR 61 | Temp 98.3°F

## 2016-05-17 DIAGNOSIS — F0280 Dementia in other diseases classified elsewhere without behavioral disturbance: Secondary | ICD-10-CM

## 2016-05-17 DIAGNOSIS — G8194 Hemiplegia, unspecified affecting left nondominant side: Secondary | ICD-10-CM | POA: Diagnosis not present

## 2016-05-17 DIAGNOSIS — S2002XA Contusion of left breast, initial encounter: Secondary | ICD-10-CM

## 2016-05-17 DIAGNOSIS — F0150 Vascular dementia without behavioral disturbance: Secondary | ICD-10-CM

## 2016-05-17 DIAGNOSIS — M25432 Effusion, left wrist: Secondary | ICD-10-CM | POA: Diagnosis not present

## 2016-05-17 DIAGNOSIS — M25532 Pain in left wrist: Secondary | ICD-10-CM

## 2016-05-17 DIAGNOSIS — R2681 Unsteadiness on feet: Secondary | ICD-10-CM

## 2016-05-17 DIAGNOSIS — R296 Repeated falls: Secondary | ICD-10-CM | POA: Diagnosis not present

## 2016-05-17 DIAGNOSIS — M6281 Muscle weakness (generalized): Secondary | ICD-10-CM | POA: Diagnosis not present

## 2016-05-17 DIAGNOSIS — G309 Alzheimer's disease, unspecified: Secondary | ICD-10-CM | POA: Diagnosis not present

## 2016-05-17 DIAGNOSIS — R278 Other lack of coordination: Secondary | ICD-10-CM | POA: Diagnosis not present

## 2016-05-17 NOTE — Progress Notes (Signed)
Patient ID: Beverly Barnett, female   DOB: 01-30-1924, 82 y.o.   MRN: [id number]    Location:  [facility] Place of Service: OFFICE  Chief Complaint  Patient presents with  . Other    Face to Face for Wheelchair, here with daughter, left breast bruise    HPI:  82 yo female seen today for w/c face-face exam.   "Pt suffers from __ Left hemiparesis due to stroke_ which impairs ability to perform ADLs (toileting, dressing, grooming, bathing) in home. A _cane/walker/crutches____will not resolve the issues with performing ADLs. Wheelchair will allow pt to safely perform daily activities. Pt can safely propel wheelchair in home or has caregiver who can provide assistance"  She fell at home 2 weeks ago in her bedroom. She hit her left breast and left hand on the floor. She did not seek medical attention. She has a large bruise on breast but improving.pain subsiding.   She saw neurology Dr Pearlean Brownie for abnormal brain MRI/A. He dx her with probable stroke despite not visualized on MRI and her sx's of left hemiparesis. He did not see any signs of NPH on exam. MRI revealed chronic microvascular disease and enlarged ventricles. She is getting PT but appears to be getting weaker on left side despite strengthening exercises. She has begun to lean more to left again and gait is very unsteady. She continues to live in ALF  Dementia - better on Aricept. She takes melatonin to help sleep. She takes vayacog daily. Weight is stable and has gained 5 lb since last OV  HTN - BP at [place] [facility] in the past have been 130-140s/60-70s usually and occasionally SBP>150. She takes losartan and metoprolol. She did not bring BP diary today  Unsteady gait/left hemiparesis - worsening per daughter despite PT. She cannot remember to do home exercises. She attends exercise daily  Hyperlipidemia - stable on colestipol. LDL 131; Total chol 235; TG 270; HDL 50  Rash - unchanged on triamcinolone cream and hydrocortisone  cream  Constipation - stable on floraster       Past Medical History:  Diagnosis Date  . Cardiac catheterisation as the cause of abnormal reaction of patient, or of later complication 10/05/2006  . Dysphagia, unspecified(787.20)   . Encounter for long-term (current) use of other medications   . High cholesterol   . Hyperosmolality and/or hypernatremia   . Hypertension   . Osteoporosis   . Other atopic dermatitis and related conditions   . Type II or unspecified type diabetes mellitus without mention of complication, not stated as uncontrolled   . Vascular dementia 06/22/2013    Past Surgical History:  Procedure Laterality Date  . FEMUR IM NAIL  07/24/2011   Procedure: INTRAMEDULLARY (IM) NAIL FEMORAL;  Surgeon: Javier Docker, MD;  Location: [facility];  Service: Orthopedics;  Laterality: Right;  Biomet, c-arm, Jackson table  . FEMUR SURGERY Right 03/2010  . FRACTURE SURGERY Left    Hip, Leg and shoulder  . INTRAMEDULLARY (IM) NAIL INTERTROCHANTERIC Left 07/30/2013   Procedure: INTRAMEDULLARY (IM) NAIL INTERTROCHANTRIC LEFT;  Surgeon: Shelda Pal, MD;  Location: [facility];  Service: Orthopedics;  Laterality: Left;  . SHOULDER SURGERY Left 03/2010    Patient Care Team: [name], DO as PCP - General (Internal Medicine)  Social History   Social History  . Marital status: Widowed    Spouse name: N/A  . Number of children: N/A  . Years of education: N/A   Occupational History  . Not on  file.   Social History Main Topics  . Smoking status: Never Smoker  . Smokeless tobacco: Never Used  . Alcohol use 0.6 oz/week    1 Glasses of wine per week     Comment: occasionally  . Drug use: No  . Sexual activity: Not on file   Other Topics Concern  . Not on file   Social History Narrative  . No narrative on file     reports that she has never smoked. She has never used smokeless tobacco. She reports that she drinks about 0.6 oz of alcohol per week . She reports that she  does not use drugs.  Family History  Problem Relation Age of Onset  . Cancer Sister   . Heart disease Father    Family Status  Relation Status  . Sister Deceased  . Mother Deceased at age 80  . Father Deceased at age 391  . Sister Deceased  . Brother Deceased  . Daughter Alive  . Son Alive  . Brother Alive  . Son Alive     No Known Allergies  Medications: Patient's Medications  New Prescriptions   No medications on file  Previous Medications   ACETAMINOPHEN (TYLENOL) 500 MG TABLET    Take 1 tablet (500 mg total) by mouth 3 (three) times daily. For one week, then tid prn thereafter   ASPIRIN EC 81 MG TABLET    Take 1 tablet (81 mg total) by mouth daily.   BENZONATATE (TESSALON) 100 MG CAPSULE    Take 1 capsule (100 mg total) by mouth every 8 (eight) hours.   CETIRIZINE (ZYRTEC) 5 MG TABLET    Take 1 tablet (5 mg total) by mouth daily.   COENZYME Q10 (CO Q 10) 100 MG CAPS    Take 1 tablet by mouth 2 (two) times daily.   COLESTIPOL (COLESTID) 1 G TABLET    Take 1 tablet (1 g total) by mouth daily.   DIPHENOXYLATE-ATROPINE (LOMOTIL) 2.5-0.025 MG TABLET    Take one tablet by mouth three times daily as needed for diarrhea (control)   DONEPEZIL (ARICEPT) 10 MG TABLET    Take 10 mg by mouth at bedtime.   LOSARTAN (COZAAR) 100 MG TABLET    Take 100 mg by mouth daily.   MELATONIN 3 MG CAPS    Take 1 capsule (3 mg total) by mouth at bedtime and may repeat dose one time if needed.   METOPROLOL TARTRATE (LOPRESSOR) 25 MG TABLET    Take 25 mg by mouth 2 (two) times daily.   MULTIPLE VITAMIN (MULITIVITAMIN WITH MINERALS) TABS    Take 1 tablet by mouth daily.    SACCHAROMYCES BOULARDII (FLORASTOR) 250 MG CAPSULE    Take 1 capsule (250 mg total) by mouth 2 (two) times daily.   TRIAMCINOLONE ACETONIDE (TRIAMCINOLONE 0.1 % CREAM : EUCERIN) CREA    Apply 1 application topically daily as needed for rash (on face).   VAYACOG 100-19.5-6.5 MG CAPS    TAKE 1 CAPSULE EVERY DAY.  Modified Medications    No medications on file  Discontinued Medications   No medications on file    Review of Systems  Unable to perform ROS: Dementia    Vitals:   05/17/16 1430  BP: 118/78  Pulse: 61  Temp: 98.3 F (36.8 C)  TempSrc: Oral  SpO2: 92%   There is no height or weight on file to calculate BMI.  Physical Exam  Constitutional: She appears well-developed.  Frail appearing in NAD, sitting  in w/c  Pulmonary/Chest: She exhibits no tenderness. Right breast exhibits no inverted nipple, no mass, no nipple discharge, no skin change and no tenderness. Left breast exhibits mass, skin change and tenderness. Left breast exhibits no inverted nipple and no nipple discharge. Breasts are symmetrical.    Musculoskeletal: She exhibits edema, tenderness and deformity.  Left UE flexion contracture at elbow; reduced ROM left wrist (chronic) with swelling and TTP posteriorly with purplish contusion noted; no bony protrusion; reduced supination/pronation; grip strength reduced on left;  Neurological: She is alert.  Left hemiparesis  Skin: Skin is warm and dry.  Psychiatric: She has a normal mood and affect. Her behavior is normal.     Labs reviewed: Office Visit on 04/10/2016  Component Date Value Ref Range Status  . Sodium 04/10/2016 140  135 - 146 mmol/L Final  . Potassium 04/10/2016 4.6  3.5 - 5.3 mmol/L Final  . Chloride 04/10/2016 106  98 - 110 mmol/L Final  . CO2 04/10/2016 23  20 - 31 mmol/L Final  . Glucose, Bld 04/10/2016 104* 65 - 99 mg/dL Final  . BUN 96/09/5407 23  7 - 25 mg/dL Final  . Creat 81/19/1478 1.20* 0.60 - 0.88 mg/dL Final   Comment:   For patients > or = 80 years of age: The upper reference limit for Creatinine is approximately 13% higher for people identified as African-American.     . Calcium 04/10/2016 9.2  8.6 - 10.4 mg/dL Final  . GFR, Est African American 04/10/2016 46* >=60 mL/min Final  . GFR, Est Non African American 04/10/2016 40* >=60 mL/min Final  .  Cholesterol 04/10/2016 227* 125 - 200 mg/dL Final  . Triglycerides 04/10/2016 218* <150 mg/dL Final  . HDL 29/56/2130 45* >=46 mg/dL Final  . Total CHOL/HDL Ratio 04/10/2016 5.0  <=8.6 Ratio Final  . VLDL 04/10/2016 44* <30 mg/dL Final  . LDL Cholesterol 04/10/2016 138* <130 mg/dL Final   Comment:   Total Cholesterol/HDL Ratio:CHD Risk                        Coronary Heart Disease Risk Table                                        Men       Women          1/2 Average Risk              3.4        3.3              Average Risk              5.0        4.4           2X Average Risk              9.6        7.1           3X Average Risk             23.4       11.0 Use the calculated Patient Ratio above and the CHD Risk table  to determine the patient's CHD Risk.   Marland Kitchen ALT 04/10/2016 13  6 - 29 U/L Final  . TSH 04/10/2016 1.84  mIU/L Final   Comment:   Reference Range   >  or = 20 Years  0.40-4.50   Pregnancy Range First trimester  0.26-2.66 Second trimester 0.55-2.73 Third trimester  0.43-2.91       No results found.   Assessment/Plan   ICD-9-CM ICD-10-CM   1. Left hemiparesis (HCC) 342.90 G81.94   2. Frequent falls V15.88 R29.6   3. Unsteady gait 781.2 R26.81   4. Mixed Alzheimer's and vascular dementia 331.0 G30.9    294.10 F01.50    290.40 F02.80   5. Contusion of left breast, initial encounter 922.0 S20.02XA US BREAST COMPLETE UNI LEFT INC AXILLA  6. Pain and swelling of left wrist 719.43 M25.532 DG Wrist Complete Left   719.03 M25.432    Apply cool compress to left breast 2 times daily x 7 days to help reduce bruise and pain  Will call with US appt  Continue other medications as ordered  Face-to-face completed for wheelchair. Forms signed  Follow up as scheduled  Krish Bailly S. Ancil Linseyarter, D. O., F. A. C. O. I.  [facility] [address] [place], [place] [address] [phone] Cell (Monday-Friday 8 AM - 5 PM) [phone] After 5 PM and  follow prompts

## 2016-05-17 NOTE — Patient Instructions (Signed)
Apply cool compress to left breast 2 times daily x 7 days to help reduce bruise and pain  Will call with US appt  Continue other medications as ordered  Face-to-face completed for wheelchair  Follow up as scheduled

## 2016-05-20 DIAGNOSIS — M6281 Muscle weakness (generalized): Secondary | ICD-10-CM | POA: Diagnosis not present

## 2016-05-20 DIAGNOSIS — R278 Other lack of coordination: Secondary | ICD-10-CM | POA: Diagnosis not present

## 2016-05-21 ENCOUNTER — Other Ambulatory Visit: Payer: Self-pay

## 2016-05-21 DIAGNOSIS — M6281 Muscle weakness (generalized): Secondary | ICD-10-CM | POA: Diagnosis not present

## 2016-05-21 DIAGNOSIS — R278 Other lack of coordination: Secondary | ICD-10-CM | POA: Diagnosis not present

## 2016-05-21 DIAGNOSIS — S2002XA Contusion of left breast, initial encounter: Secondary | ICD-10-CM

## 2016-05-22 DIAGNOSIS — M6281 Muscle weakness (generalized): Secondary | ICD-10-CM | POA: Diagnosis not present

## 2016-05-22 DIAGNOSIS — M25552 Pain in left hip: Secondary | ICD-10-CM | POA: Diagnosis not present

## 2016-05-22 DIAGNOSIS — R2681 Unsteadiness on feet: Secondary | ICD-10-CM | POA: Diagnosis not present

## 2016-05-23 DIAGNOSIS — R278 Other lack of coordination: Secondary | ICD-10-CM | POA: Diagnosis not present

## 2016-05-23 DIAGNOSIS — M6281 Muscle weakness (generalized): Secondary | ICD-10-CM | POA: Diagnosis not present

## 2016-05-24 DIAGNOSIS — M6281 Muscle weakness (generalized): Secondary | ICD-10-CM | POA: Diagnosis not present

## 2016-05-24 DIAGNOSIS — M25552 Pain in left hip: Secondary | ICD-10-CM | POA: Diagnosis not present

## 2016-05-24 DIAGNOSIS — R2681 Unsteadiness on feet: Secondary | ICD-10-CM | POA: Diagnosis not present

## 2016-05-27 ENCOUNTER — Telehealth: Payer: Self-pay | Admitting: *Deleted

## 2016-05-27 DIAGNOSIS — M6281 Muscle weakness (generalized): Secondary | ICD-10-CM | POA: Diagnosis not present

## 2016-05-27 DIAGNOSIS — R278 Other lack of coordination: Secondary | ICD-10-CM | POA: Diagnosis not present

## 2016-05-27 NOTE — Telephone Encounter (Signed)
Elnita Maxwellheryl with [facility] called and stated she needed a signed OV note in order for patient to get Wheelchair. This was faxed last week but she stated she did not receive. Refaxed to Fax#: [phone]

## 2016-05-28 DIAGNOSIS — R399 Unspecified symptoms and signs involving the genitourinary system: Secondary | ICD-10-CM | POA: Diagnosis not present

## 2016-05-28 DIAGNOSIS — R278 Other lack of coordination: Secondary | ICD-10-CM | POA: Diagnosis not present

## 2016-05-28 DIAGNOSIS — M6281 Muscle weakness (generalized): Secondary | ICD-10-CM | POA: Diagnosis not present

## 2016-05-29 ENCOUNTER — Telehealth: Payer: Self-pay | Admitting: Internal Medicine

## 2016-05-29 DIAGNOSIS — M6281 Muscle weakness (generalized): Secondary | ICD-10-CM | POA: Diagnosis not present

## 2016-05-29 DIAGNOSIS — R2681 Unsteadiness on feet: Secondary | ICD-10-CM | POA: Diagnosis not present

## 2016-05-29 DIAGNOSIS — M25552 Pain in left hip: Secondary | ICD-10-CM | POA: Diagnosis not present

## 2016-05-29 NOTE — Telephone Encounter (Signed)
left msg asking pt to confirm this AWV appt w/ nurse. VDM (DD) °

## 2016-05-30 DIAGNOSIS — M6281 Muscle weakness (generalized): Secondary | ICD-10-CM | POA: Diagnosis not present

## 2016-05-30 DIAGNOSIS — R278 Other lack of coordination: Secondary | ICD-10-CM | POA: Diagnosis not present

## 2016-05-31 DIAGNOSIS — M25552 Pain in left hip: Secondary | ICD-10-CM | POA: Diagnosis not present

## 2016-05-31 DIAGNOSIS — R2681 Unsteadiness on feet: Secondary | ICD-10-CM | POA: Diagnosis not present

## 2016-05-31 DIAGNOSIS — M6281 Muscle weakness (generalized): Secondary | ICD-10-CM | POA: Diagnosis not present

## 2016-06-04 DIAGNOSIS — M6281 Muscle weakness (generalized): Secondary | ICD-10-CM | POA: Diagnosis not present

## 2016-06-04 DIAGNOSIS — R278 Other lack of coordination: Secondary | ICD-10-CM | POA: Diagnosis not present

## 2016-06-05 ENCOUNTER — Ambulatory Visit
Admission: RE | Admit: 2016-06-05 | Discharge: 2016-06-05 | Disposition: A | Discharge status: Home / Self Care | Payer: Medicare Other | Source: Ambulatory Visit | Attending: Internal Medicine | Admitting: Internal Medicine

## 2016-06-05 DIAGNOSIS — S2002XA Contusion of left breast, initial encounter: Secondary | ICD-10-CM

## 2016-06-05 DIAGNOSIS — R2681 Unsteadiness on feet: Secondary | ICD-10-CM | POA: Diagnosis not present

## 2016-06-05 DIAGNOSIS — M6281 Muscle weakness (generalized): Secondary | ICD-10-CM | POA: Diagnosis not present

## 2016-06-05 DIAGNOSIS — M25552 Pain in left hip: Secondary | ICD-10-CM | POA: Diagnosis not present

## 2016-06-05 DIAGNOSIS — R928 Other abnormal and inconclusive findings on diagnostic imaging of breast: Secondary | ICD-10-CM | POA: Diagnosis not present

## 2016-06-05 DIAGNOSIS — N6489 Other specified disorders of breast: Secondary | ICD-10-CM | POA: Diagnosis not present

## 2016-06-06 DIAGNOSIS — M6281 Muscle weakness (generalized): Secondary | ICD-10-CM | POA: Diagnosis not present

## 2016-06-06 DIAGNOSIS — R278 Other lack of coordination: Secondary | ICD-10-CM | POA: Diagnosis not present

## 2016-06-07 DIAGNOSIS — R2681 Unsteadiness on feet: Secondary | ICD-10-CM | POA: Diagnosis not present

## 2016-06-07 DIAGNOSIS — M6281 Muscle weakness (generalized): Secondary | ICD-10-CM | POA: Diagnosis not present

## 2016-06-07 DIAGNOSIS — M25552 Pain in left hip: Secondary | ICD-10-CM | POA: Diagnosis not present

## 2016-06-07 NOTE — Telephone Encounter (Signed)
left another msg asking pt to confirm this AWV appt w/ nurse. VDM (DD) °

## 2016-06-11 DIAGNOSIS — R278 Other lack of coordination: Secondary | ICD-10-CM | POA: Diagnosis not present

## 2016-06-11 DIAGNOSIS — M6281 Muscle weakness (generalized): Secondary | ICD-10-CM | POA: Diagnosis not present

## 2016-06-12 DIAGNOSIS — M6281 Muscle weakness (generalized): Secondary | ICD-10-CM | POA: Diagnosis not present

## 2016-06-12 DIAGNOSIS — M25552 Pain in left hip: Secondary | ICD-10-CM | POA: Diagnosis not present

## 2016-06-12 DIAGNOSIS — R2681 Unsteadiness on feet: Secondary | ICD-10-CM | POA: Diagnosis not present

## 2016-06-13 DIAGNOSIS — R278 Other lack of coordination: Secondary | ICD-10-CM | POA: Diagnosis not present

## 2016-06-13 DIAGNOSIS — M6281 Muscle weakness (generalized): Secondary | ICD-10-CM | POA: Diagnosis not present

## 2016-06-14 DIAGNOSIS — M6281 Muscle weakness (generalized): Secondary | ICD-10-CM | POA: Diagnosis not present

## 2016-06-14 DIAGNOSIS — M25552 Pain in left hip: Secondary | ICD-10-CM | POA: Diagnosis not present

## 2016-06-14 DIAGNOSIS — R2681 Unsteadiness on feet: Secondary | ICD-10-CM | POA: Diagnosis not present

## 2016-06-17 DIAGNOSIS — R278 Other lack of coordination: Secondary | ICD-10-CM | POA: Diagnosis not present

## 2016-06-17 DIAGNOSIS — M6281 Muscle weakness (generalized): Secondary | ICD-10-CM | POA: Diagnosis not present

## 2016-06-18 DIAGNOSIS — R278 Other lack of coordination: Secondary | ICD-10-CM | POA: Diagnosis not present

## 2016-06-18 DIAGNOSIS — M6281 Muscle weakness (generalized): Secondary | ICD-10-CM | POA: Diagnosis not present

## 2016-06-19 DIAGNOSIS — M25552 Pain in left hip: Secondary | ICD-10-CM | POA: Diagnosis not present

## 2016-06-19 DIAGNOSIS — R2681 Unsteadiness on feet: Secondary | ICD-10-CM | POA: Diagnosis not present

## 2016-06-19 DIAGNOSIS — M6281 Muscle weakness (generalized): Secondary | ICD-10-CM | POA: Diagnosis not present

## 2016-06-20 DIAGNOSIS — R278 Other lack of coordination: Secondary | ICD-10-CM | POA: Diagnosis not present

## 2016-06-20 DIAGNOSIS — M6281 Muscle weakness (generalized): Secondary | ICD-10-CM | POA: Diagnosis not present

## 2016-06-21 DIAGNOSIS — M25552 Pain in left hip: Secondary | ICD-10-CM | POA: Diagnosis not present

## 2016-06-21 DIAGNOSIS — M6281 Muscle weakness (generalized): Secondary | ICD-10-CM | POA: Diagnosis not present

## 2016-06-21 DIAGNOSIS — R2681 Unsteadiness on feet: Secondary | ICD-10-CM | POA: Diagnosis not present

## 2016-06-25 DIAGNOSIS — R278 Other lack of coordination: Secondary | ICD-10-CM | POA: Diagnosis not present

## 2016-06-25 DIAGNOSIS — M6281 Muscle weakness (generalized): Secondary | ICD-10-CM | POA: Diagnosis not present

## 2016-06-26 DIAGNOSIS — M6281 Muscle weakness (generalized): Secondary | ICD-10-CM | POA: Diagnosis not present

## 2016-06-26 DIAGNOSIS — M25552 Pain in left hip: Secondary | ICD-10-CM | POA: Diagnosis not present

## 2016-06-26 DIAGNOSIS — R2681 Unsteadiness on feet: Secondary | ICD-10-CM | POA: Diagnosis not present

## 2016-06-27 DIAGNOSIS — M6281 Muscle weakness (generalized): Secondary | ICD-10-CM | POA: Diagnosis not present

## 2016-06-27 DIAGNOSIS — R278 Other lack of coordination: Secondary | ICD-10-CM | POA: Diagnosis not present

## 2016-06-28 DIAGNOSIS — R278 Other lack of coordination: Secondary | ICD-10-CM | POA: Diagnosis not present

## 2016-06-28 DIAGNOSIS — M6281 Muscle weakness (generalized): Secondary | ICD-10-CM | POA: Diagnosis not present

## 2016-06-28 DIAGNOSIS — R399 Unspecified symptoms and signs involving the genitourinary system: Secondary | ICD-10-CM | POA: Diagnosis not present

## 2016-07-01 DIAGNOSIS — M6281 Muscle weakness (generalized): Secondary | ICD-10-CM | POA: Diagnosis not present

## 2016-07-01 DIAGNOSIS — R278 Other lack of coordination: Secondary | ICD-10-CM | POA: Diagnosis not present

## 2016-07-03 DIAGNOSIS — R278 Other lack of coordination: Secondary | ICD-10-CM | POA: Diagnosis not present

## 2016-07-03 DIAGNOSIS — M6281 Muscle weakness (generalized): Secondary | ICD-10-CM | POA: Diagnosis not present

## 2016-07-04 DIAGNOSIS — M6281 Muscle weakness (generalized): Secondary | ICD-10-CM | POA: Diagnosis not present

## 2016-07-04 DIAGNOSIS — R278 Other lack of coordination: Secondary | ICD-10-CM | POA: Diagnosis not present

## 2016-07-09 DIAGNOSIS — R278 Other lack of coordination: Secondary | ICD-10-CM | POA: Diagnosis not present

## 2016-07-09 DIAGNOSIS — M6281 Muscle weakness (generalized): Secondary | ICD-10-CM | POA: Diagnosis not present

## 2016-07-10 DIAGNOSIS — M6281 Muscle weakness (generalized): Secondary | ICD-10-CM | POA: Diagnosis not present

## 2016-07-10 DIAGNOSIS — R278 Other lack of coordination: Secondary | ICD-10-CM | POA: Diagnosis not present

## 2016-07-11 DIAGNOSIS — R278 Other lack of coordination: Secondary | ICD-10-CM | POA: Diagnosis not present

## 2016-07-11 DIAGNOSIS — M6281 Muscle weakness (generalized): Secondary | ICD-10-CM | POA: Diagnosis not present

## 2016-07-16 DIAGNOSIS — R278 Other lack of coordination: Secondary | ICD-10-CM | POA: Diagnosis not present

## 2016-07-16 DIAGNOSIS — M6281 Muscle weakness (generalized): Secondary | ICD-10-CM | POA: Diagnosis not present

## 2016-07-18 DIAGNOSIS — R278 Other lack of coordination: Secondary | ICD-10-CM | POA: Diagnosis not present

## 2016-07-18 DIAGNOSIS — M6281 Muscle weakness (generalized): Secondary | ICD-10-CM | POA: Diagnosis not present

## 2016-07-19 ENCOUNTER — Ambulatory Visit (INDEPENDENT_AMBULATORY_CARE_PROVIDER_SITE_OTHER): Payer: Medicare Other | Admitting: Internal Medicine

## 2016-07-19 ENCOUNTER — Encounter: Payer: Self-pay | Admitting: Internal Medicine

## 2016-07-19 ENCOUNTER — Ambulatory Visit (INDEPENDENT_AMBULATORY_CARE_PROVIDER_SITE_OTHER): Payer: Medicare Other

## 2016-07-19 VITALS — BP 164/70 | HR 63 | Temp 98.0°F | Ht 64.0 in | Wt 136.8 lb

## 2016-07-19 VITALS — BP 164/70 | HR 63 | Temp 98.0°F | Ht 64.0 in | Wt 136.0 lb

## 2016-07-19 DIAGNOSIS — R296 Repeated falls: Secondary | ICD-10-CM

## 2016-07-19 DIAGNOSIS — I10 Essential (primary) hypertension: Secondary | ICD-10-CM | POA: Diagnosis not present

## 2016-07-19 DIAGNOSIS — E785 Hyperlipidemia, unspecified: Secondary | ICD-10-CM | POA: Diagnosis not present

## 2016-07-19 DIAGNOSIS — Z0000 Encounter for general adult medical examination without abnormal findings: Secondary | ICD-10-CM | POA: Diagnosis not present

## 2016-07-19 DIAGNOSIS — G8194 Hemiplegia, unspecified affecting left nondominant side: Secondary | ICD-10-CM | POA: Diagnosis not present

## 2016-07-19 DIAGNOSIS — G309 Alzheimer's disease, unspecified: Secondary | ICD-10-CM

## 2016-07-19 DIAGNOSIS — F0150 Vascular dementia without behavioral disturbance: Secondary | ICD-10-CM | POA: Diagnosis not present

## 2016-07-19 DIAGNOSIS — R2681 Unsteadiness on feet: Secondary | ICD-10-CM | POA: Diagnosis not present

## 2016-07-19 DIAGNOSIS — Z23 Encounter for immunization: Secondary | ICD-10-CM | POA: Diagnosis not present

## 2016-07-19 DIAGNOSIS — F0280 Dementia in other diseases classified elsewhere without behavioral disturbance: Secondary | ICD-10-CM | POA: Diagnosis not present

## 2016-07-19 DIAGNOSIS — J302 Other seasonal allergic rhinitis: Secondary | ICD-10-CM | POA: Diagnosis not present

## 2016-07-19 NOTE — Progress Notes (Signed)
Quick Notes   Health Maintenance:  Due for foot exam, A1C;     Abnormal Screen: MMSE-16/30; Failed Clock test    Patient Concerns:  Pt just "wants to get better."    Nurse Concerns:  None;

## 2016-07-19 NOTE — Progress Notes (Signed)
Subjective:   Beverly Barnett is a 83 y.o. female who presents for an Initial Medicare Annual Wellness Visit.  Review of Systems    Cardiac Risk Factors include: advanced age (>13men, >63 women);diabetes mellitus;dyslipidemia;family history of premature cardiovascular disease;hypertension;sedentary lifestyle     Objective:    Today's Vitals   07/19/16 1339  BP: (!) 164/70  Pulse: 63  Temp: 98 F (36.7 C)  TempSrc: Oral  SpO2: 98%  Weight: 136 lb 12.8 oz (62.1 kg)  Height: 5\' 4"  (1.626 m)   Body mass index is 23.48 kg/m.   Current Medications (verified) Outpatient Encounter Prescriptions as of 07/19/2016  Medication Sig  . acetaminophen (TYLENOL) 500 MG tablet Take 1 tablet (500 mg total) by mouth 3 (three) times daily. For one week, then tid prn thereafter  . aspirin EC 81 MG tablet Take 1 tablet (81 mg total) by mouth daily.  . benzonatate (TESSALON) 100 MG capsule Take 100 mg by mouth 3 (three) times daily as needed for cough.  . cetirizine (ZYRTEC) 5 MG tablet Take 1 tablet (5 mg total) by mouth daily.  . Coenzyme Q10 (CO Q 10) 100 MG CAPS Take 1 tablet by mouth 2 (two) times daily.  . colestipol (COLESTID) 1 G tablet Take 1 tablet (1 g total) by mouth daily.  . diphenoxylate-atropine (LOMOTIL) 2.5-0.025 MG tablet Take one tablet by mouth three times daily as needed for diarrhea (control)  . donepezil (ARICEPT) 10 MG tablet Take 10 mg by mouth at bedtime.  Marland Kitchen losartan (COZAAR) 100 MG tablet Take 100 mg by mouth daily.  . Melatonin 3 MG CAPS Take 1 capsule (3 mg total) by mouth at bedtime and may repeat dose one time if needed.  . metoprolol tartrate (LOPRESSOR) 25 MG tablet Take 25 mg by mouth 2 (two) times daily.  . Multiple Vitamin (MULITIVITAMIN WITH MINERALS) TABS Take 1 tablet by mouth daily.   . Triamcinolone Acetonide (TRIAMCINOLONE 0.1 % CREAM : EUCERIN) CREA Apply 1 application topically daily as needed for rash (on face).  Marland Kitchen VAYACOG 100-19.5-6.5 MG CAPS TAKE 1  CAPSULE EVERY DAY.  . [DISCONTINUED] benzonatate (TESSALON) 100 MG capsule Take 1 capsule (100 mg total) by mouth every 8 (eight) hours.  . [DISCONTINUED] saccharomyces boulardii (FLORASTOR) 250 MG capsule Take 1 capsule (250 mg total) by mouth 2 (two) times daily.   No facility-administered encounter medications on file as of 07/19/2016.     Allergies (verified) Patient has no known allergies.   History: Past Medical History:  Diagnosis Date  . Cardiac catheterisation as the cause of abnormal reaction of patient, or of later complication 10/05/2006  . Dysphagia, unspecified(787.20)   . Encounter for long-term (current) use of other medications   . High cholesterol   . Hyperosmolality and/or hypernatremia   . Hypertension   . Osteoporosis   . Other atopic dermatitis and related conditions   . Type II or unspecified type diabetes mellitus without mention of complication, not stated as uncontrolled   . Vascular dementia 06/22/2013   Past Surgical History:  Procedure Laterality Date  . FEMUR IM NAIL  07/24/2011   Procedure: INTRAMEDULLARY (IM) NAIL FEMORAL;  Surgeon: Javier Docker, MD;  Location: [facility];  Service: Orthopedics;  Laterality: Right;  Biomet, c-arm, Jackson table  . FEMUR SURGERY Right 03/2010  . FRACTURE SURGERY Left    Hip, Leg and shoulder  . INTRAMEDULLARY (IM) NAIL INTERTROCHANTERIC Left 07/30/2013   Procedure: INTRAMEDULLARY (IM) NAIL INTERTROCHANTRIC LEFT;  Surgeon: Molli Hazard  Rosalia Hammers Olin, MD;  Location: [facility];  Service: Orthopedics;  Laterality: Left;  . SHOULDER SURGERY Left 03/2010   Family History  Problem Relation Age of Onset  . Cancer Sister   . Heart disease Father    Social History   Occupational History  . Not on file.   Social History Main Topics  . Smoking status: Never Smoker  . Smokeless tobacco: Never Used  . Alcohol use 0.6 oz/week    1 Glasses of wine per week     Comment: occasionally  . Drug use: No  . Sexual activity: Not on file     Tobacco Counseling Counseling given: No   Activities of Daily Living In your present state of health, do you have any difficulty performing the following activities: 07/19/2016  Hearing? N  Vision? Y  Difficulty concentrating or making decisions? Y  Walking or climbing stairs? Y  Dressing or bathing? Y  Doing errands, shopping? Y  Preparing Food and eating ? Y  Using the Toilet? Y  In the past six months, have you accidently leaked urine? Y  Do you have problems with loss of bowel control? N  Managing your Medications? Y  Managing your Finances? Y  Housekeeping or managing your Housekeeping? Y  Some recent data might be hidden    Immunizations and Health Maintenance Immunization History  Administered Date(s) Administered  . Influenza,inj,Quad PF,36+ Mos 02/23/2013, 06/27/2014, 03/03/2015  . Influenza-Unspecified 03/12/2016  . Pneumococcal Conjugate-13 07/19/2016  . Pneumococcal Polysaccharide-23 07/24/2011  . Zoster 02/19/2008   Health Maintenance Due  Topic Date Due  . FOOT EXAM  02/11/1934  . HEMOGLOBIN A1C  01/26/2014    Patient Care Team: Kirt BoysMonica Maclean Foister, DO as PCP - General (Internal Medicine)  Indicate any recent Medical Services you may have received from other than [facility] providers in the past year (date may be approximate).     Assessment:   This is a routine wellness examination for Beverly Barnett.  Hearing/Vision screen Hearing Screening Comments: Pt has not had a hearing screen in several years but denies any problems with hearing at this time.  Vision Screening Comments: Last eye exam was done several years ago. Pt states she has not noticed a decrease in sight at this time. Only wears glasses to read.  Dietary issues and exercise activities discussed: Current Exercise Habits: Home exercise routine, Type of exercise: strength training/weights;stretching, Exercise limited by: neurologic condition(s)  Goals    . Weight (lb) < 126 lb (57.2 kg)          Starting  07/19/16, I will attempt to decrease my weight to get to my goal weight of 126 lb.       Depression Screen PHQ 2/9 Scores 07/19/2016 03/03/2015 08/12/2014 07/11/2014 05/03/2014 01/11/2014 02/16/2013  PHQ - 2 Score 1 0 0 0 0 0 0    Fall Risk Fall Risk  07/19/2016 05/17/2016 12/25/2015 07/26/2015 05/12/2015  Falls in the past year? Yes Yes No No No  Number falls in past yr: 2 or more 2 or more - - -  Injury with Fall? Yes Yes - - -  Risk Factor Category  High Fall Risk - - - -  Risk for fall due to : Impaired mobility;History of fall(s);Impaired balance/gait - - - -  Follow up Falls prevention discussed - - - -    Cognitive Function: MMSE - Mini Mental State Exam 07/19/2016 10/13/2013 06/22/2013  Orientation to time 1 4 4   Orientation to Place 0 4 4  Registration 3 3 3   Attention/ Calculation 3 5 4   Recall 2 0 0  Language- name 2 objects 2 2 2   Language- repeat 1 1 1   Language- follow 3 step command 3 3 3   Language- read & follow direction 1 1 1   Write a sentence 0 1 1  Copy design 0 1 1  Total score 16 25 24         Screening Tests Health Maintenance  Topic Date Due  . FOOT EXAM  02/11/1934  . HEMOGLOBIN A1C  01/26/2014  . OPHTHALMOLOGY EXAM  07/11/2017 (Originally 02/11/1934)  . TETANUS/TDAP  07/11/2017 (Originally 02/10/1943)  . INFLUENZA VACCINE  Completed  . DEXA SCAN  Completed  . ZOSTAVAX  Completed  . PNA vac Low Risk Adult  Completed      Plan:    I have personally reviewed and addressed the Medicare Annual Wellness questionnaire and have noted the following in the patient's chart:  A. Medical and social history B. Use of alcohol, tobacco or illicit drugs  C. Current medications and supplements D. Functional ability and status E.  Nutritional status F.  Physical activity G. Advance directives H. List of other physicians I.  Hospitalizations, surgeries, and ER visits in previous 12 months J.  Vitals K. Screenings to include hearing, vision, cognitive, depression L. Referrals  and appointments - none  In addition, I have reviewed and discussed with patient certain preventive protocols, quality metrics, and best practice recommendations. A written personalized care plan for preventive services as well as general preventive health recommendations were provided to patient.  See attached scanned questionnaire for additional information.   Signed,   Nilda Calamity, LPN Health Advisor  I have reviewed the health advisor's note and was available for consultation. I agree with documentation and plan.   Konstantine Gervasi S. Ancil Linsey  [facility] [address] [place], [place] [address] [phone] Cell (Monday-Friday 8 AM - 5 PM) [phone] After 5 PM and follow prompts

## 2016-07-19 NOTE — Patient Instructions (Addendum)
Reduce daily BP/HR check to once per week  Continue other medications as ordered  Follow up in 3 mos for routine visit

## 2016-07-19 NOTE — Progress Notes (Signed)
Patient ID: Beverly Barnett, female   DOB: 02/20/1924, 83 y.o.   MRN: [id number]    Location:  [facility] Place of Service: OFFICE  Chief Complaint  Patient presents with  . Medical Management of Chronic Issues    3 month follow-up, + Fall Risk, MMSE 16/30 (failed clock drawing)     HPI:  83 yo female seen today for f/u. No new concerns today. She has increased risk of falls due to unsteady gait. she reports her memory is worse and she is forgetting family members  She saw neurology Dr Pearlean BrownieSethi for abnormal brain MRI/A. He dx her with probable stroke despite not visualized on MRI and her sx's of left hemiparesis. He did not see any signs of NPH on exam. MRI revealed chronic microvascular disease and enlarged ventricles. She is getting PT but appears to be getting weaker on left side despite strengthening exercises. She has begun to lean more to left again and gait is very unsteady. She continues to live in ALF  Dementia - better on Aricept. MMSE score 16/30 today. She takes melatonin to help sleep. She takes vayacog daily. Weight is stable and has gained 8 lb since last OV. She is more sedentary and uses w/c more  HTN - [facility] [place] [facility] in the past have been 110-140s/60-70s usually and occasionally SBP>150. HR 60/70s. She takes losartan and metoprolol.   Unsteady gait/left hemiparesis - worsening per daughter despite PT. She cannot remember to do home exercises. She attends exercise daily  Hyperlipidemia - stable on colestipol. LDL 138; Total chol 227; TG 218; HDL 50  Rash - unchanged on triamcinolone cream and hydrocortisone cream  Constipation - stable on floraster   Past Medical History:  Diagnosis Date  . Cardiac catheterisation as the cause of abnormal reaction of patient, or of later complication 10/05/2006  . Dysphagia, unspecified(787.20)   . Encounter for long-term (current) use of other medications   . High cholesterol   . Hyperosmolality and/or hypernatremia   .  Hypertension   . Osteoporosis   . Other atopic dermatitis and related conditions   . Type II or unspecified type diabetes mellitus without mention of complication, not stated as uncontrolled   . Vascular dementia 06/22/2013    Past Surgical History:  Procedure Laterality Date  . FEMUR IM NAIL  07/24/2011   Procedure: INTRAMEDULLARY (IM) NAIL FEMORAL;  Surgeon: Javier DockerJeffrey C Beane, MD;  Location: [facility];  Service: Orthopedics;  Laterality: Right;  Biomet, c-arm, Jackson table  . FEMUR SURGERY Right 03/2010  . FRACTURE SURGERY Left    Hip, Leg and shoulder  . INTRAMEDULLARY (IM) NAIL INTERTROCHANTERIC Left 07/30/2013   Procedure: INTRAMEDULLARY (IM) NAIL INTERTROCHANTRIC LEFT;  Surgeon: Shelda PalMatthew D Olin, MD;  Location: [facility];  Service: Orthopedics;  Laterality: Left;  . SHOULDER SURGERY Left 03/2010    Patient Care Team: Kirt BoysMonica Toniette Devera, DO as PCP - General (Internal Medicine)  Social History   Social History  . Marital status: Widowed    Spouse name: N/A  . Number of children: N/A  . Years of education: N/A   Occupational History  . Not on file.   Social History Main Topics  . Smoking status: Never Smoker  . Smokeless tobacco: Never Used  . Alcohol use 0.6 oz/week    1 Glasses of wine per week     Comment: occasionally  . Drug use: No  . Sexual activity: Not on file   Other Topics Concern  . Not on  file   Social History Narrative  . No narrative on file     reports that she has never smoked. She has never used smokeless tobacco. She reports that she drinks about 0.6 oz of alcohol per week . She reports that she does not use drugs.  Family History  Problem Relation Age of Onset  . Cancer Sister   . Heart disease Father    Family Status  Relation Status  . Sister Deceased  . Mother Deceased at age 8  . Father Deceased at age 31  . Sister Deceased  . Brother Deceased  . Daughter Alive  . Son Alive  . Brother Alive  . Son Alive     No Known  Allergies  Medications: Patient's Medications  New Prescriptions   No medications on file  Previous Medications   ACETAMINOPHEN (TYLENOL) 500 MG TABLET    Take 1 tablet (500 mg total) by mouth 3 (three) times daily. For one week, then tid prn thereafter   ASPIRIN EC 81 MG TABLET    Take 1 tablet (81 mg total) by mouth daily.   BENZONATATE (TESSALON) 100 MG CAPSULE    Take 100 mg by mouth 3 (three) times daily as needed for cough.   CETIRIZINE (ZYRTEC) 5 MG TABLET    Take 1 tablet (5 mg total) by mouth daily.   COENZYME Q10 (CO Q 10) 100 MG CAPS    Take 1 tablet by mouth 2 (two) times daily.   COLESTIPOL (COLESTID) 1 G TABLET    Take 1 tablet (1 g total) by mouth daily.   DIPHENOXYLATE-ATROPINE (LOMOTIL) 2.5-0.025 MG TABLET    Take one tablet by mouth three times daily as needed for diarrhea (control)   DONEPEZIL (ARICEPT) 10 MG TABLET    Take 10 mg by mouth at bedtime.   LOSARTAN (COZAAR) 100 MG TABLET    Take 100 mg by mouth daily.   MELATONIN 3 MG CAPS    Take 1 capsule (3 mg total) by mouth at bedtime and may repeat dose one time if needed.   METOPROLOL TARTRATE (LOPRESSOR) 25 MG TABLET    Take 25 mg by mouth 2 (two) times daily.   MULTIPLE VITAMIN (MULITIVITAMIN WITH MINERALS) TABS    Take 1 tablet by mouth daily.    TRIAMCINOLONE ACETONIDE (TRIAMCINOLONE 0.1 % CREAM : EUCERIN) CREA    Apply 1 application topically daily as needed for rash (on face).   VAYACOG 100-19.5-6.5 MG CAPS    TAKE 1 CAPSULE EVERY DAY.  Modified Medications   No medications on file  Discontinued Medications   No medications on file    Review of Systems  Unable to perform ROS: Dementia    Vitals:   07/19/16 1327  BP: (!) 164/70 repeat by myself 162/70  Pulse: 63  Temp: 98 F (36.7 C)  TempSrc: Oral  SpO2: 98%  Weight: 136 lb (61.7 kg)  Height: 5\' 4"  (1.626 m)   Body mass index is 23.34 kg/m.  Physical Exam  Constitutional: She appears well-developed.  Frail appearing in NAD  HENT:   Mouth/Throat: Oropharynx is clear and moist. No oropharyngeal exudate.  Eyes: Pupils are equal, round, and reactive to light. No scleral icterus.  Neck: Neck supple. Carotid bruit is not present. No tracheal deviation present. No thyromegaly present.  Cardiovascular: Normal rate, regular rhythm and intact distal pulses.  Exam reveals no gallop and no friction rub.   Murmur (1/6 SEM) heard. No LE edema b/l. no calf  TTP.   Pulmonary/Chest: Effort normal and breath sounds normal. No stridor. No respiratory distress. She has no wheezes. She has no rales.  Abdominal: Soft. Bowel sounds are normal. She exhibits no distension and no mass. There is no hepatomegaly. There is no tenderness. There is no rebound and no guarding.  Musculoskeletal: She exhibits edema and tenderness.  Thoracic kyphoscoliosis; poor posture; L>R reduced internal hip rotation and flexion of hip  Lymphadenopathy:    She has no cervical adenopathy.  Neurological: She is alert. Gait (slow and shuffling; unsteady) abnormal.  Reduced CN 12 but other CN grossly intact; grip strength 4/5 on left; strength 4/5 b/l LE; speech slurred  Skin: Skin is warm and dry. No rash noted.  Psychiatric: She has a normal mood and affect. Her behavior is normal. Her speech is delayed and slurred. Cognition and memory are impaired.     Labs reviewed: No visits with results within 3 Month(s) from this visit.  Latest known visit with results is:  Office Visit on 04/10/2016  Component Date Value Ref Range Status  . Sodium 04/10/2016 140  135 - 146 mmol/L Final  . Potassium 04/10/2016 4.6  3.5 - 5.3 mmol/L Final  . Chloride 04/10/2016 106  98 - 110 mmol/L Final  . CO2 04/10/2016 23  20 - 31 mmol/L Final  . Glucose, Bld 04/10/2016 104* 65 - 99 mg/dL Final  . BUN 16/03/9603 23  7 - 25 mg/dL Final  . Creat 54/02/8118 1.20* 0.60 - 0.88 mg/dL Final   Comment:   For patients > or = 81 years of age: The upper reference limit for Creatinine is  approximately 13% higher for people identified as African-American.     . Calcium 04/10/2016 9.2  8.6 - 10.4 mg/dL Final  . GFR, Est African American 04/10/2016 46* >=60 mL/min Final  . GFR, Est Non African American 04/10/2016 40* >=60 mL/min Final  . Cholesterol 04/10/2016 227* 125 - 200 mg/dL Final  . Triglycerides 04/10/2016 218* <150 mg/dL Final  . HDL 14/78/2956 45* >=46 mg/dL Final  . Total CHOL/HDL Ratio 04/10/2016 5.0  <=2.1 Ratio Final  . VLDL 04/10/2016 44* <30 mg/dL Final  . LDL Cholesterol 04/10/2016 138* <130 mg/dL Final   Comment:   Total Cholesterol/HDL Ratio:CHD Risk                        Coronary Heart Disease Risk Table                                        Men       Women          1/2 Average Risk              3.4        3.3              Average Risk              5.0        4.4           2X Average Risk              9.6        7.1           3X Average Risk             23.4  11.0 Use the calculated Patient Ratio above and the CHD Risk table  to determine the patient's CHD Risk.   Marland Kitchen ALT 04/10/2016 13  6 - 29 U/L Final  . TSH 04/10/2016 1.84  mIU/L Final   Comment:   Reference Range   > or = 20 Years  0.40-4.50   Pregnancy Range First trimester  0.26-2.66 Second trimester 0.55-2.73 Third trimester  0.43-2.91       No results found.   Assessment/Plan   ICD-9-CM ICD-10-CM   1. Mixed Alzheimer's and vascular dementia 331.0 G30.9    294.10 F01.50    290.40 F02.80   2. Essential hypertension, benign 401.1 I10   3. Frequent falls V15.88 R29.6   4. Hemiparesis, left (HCC) 342.90 G81.94   5. Seasonal allergic rhinitis, unspecified chronicity, unspecified trigger 477.9 J30.2   6. Hyperlipidemia with target LDL less than 130 272.4 E78.5   7. Unsteady gait 781.2 R26.81    Reduce daily BP/HR check to once per week  Continue other medications as ordered  Follow up in 3 mos for routine visit  Athelene Hursey S. Ancil Linsey  [facility] [address] [place], [place] [address] [phone] Cell (Monday-Friday 8 AM - 5 PM) [phone] After 5 PM and follow prompts

## 2016-07-19 NOTE — Patient Instructions (Addendum)
[name] , Thank you for taking time to come for your Medicare Wellness Visit. I appreciate your ongoing commitment to your health goals. Please review the following plan we discussed and let me know if I can assist you in the future.   These are the goals we discussed: Goals    . Weight (lb) < 126 lb (57.2 kg)          Starting 07/19/16, I will attempt to decrease my weight to get to my goal weight of 126 lb.        This is a list of the screening recommended for you and due dates:  Health Maintenance  Topic Date Due  . Complete foot exam   02/11/1936  . Hemoglobin A1C  01/26/2014  . Eye exam for diabetics  07/11/2017*  . Tetanus Vaccine  07/11/2017*  . Flu Shot  Completed  . DEXA scan (bone density measurement)  Completed  . Shingles Vaccine  Completed  . Pneumonia vaccines  Completed  *Topic was postponed. The date shown is not the original due date.  Preventive Care for Adults  A healthy lifestyle and preventive care can promote health and wellness. Preventive health guidelines for adults include the following key practices.  . A routine yearly physical is a good way to check with your health care provider about your health and preventive screening. It is a chance to share any concerns and updates on your health and to receive a thorough exam.  . Visit your dentist for a routine exam and preventive care every 6 months. Brush your teeth twice a day and floss once a day. Good oral hygiene prevents tooth decay and gum disease.  . The frequency of eye exams is based on your age, health, family medical history, use  of contact lenses, and other factors. Follow your health care provider's ecommendations for frequency of eye exams.  . Eat a healthy diet. Foods like vegetables, fruits, whole grains, low-fat dairy products, and lean protein foods contain the nutrients you need without too many calories. Decrease your intake of foods high in solid fats, added sugars, and salt. Eat the  right amount of calories for you. Get information about a proper diet from your health care provider, if necessary.  . Regular physical exercise is one of the most important things you can do for your health. Most adults should get at least 150 minutes of moderate-intensity exercise (any activity that increases your heart rate and causes you to sweat) each week. In addition, most adults need muscle-strengthening exercises on 2 or more days a week.  Silver Sneakers may be a benefit available to you. To determine eligibility, you may visit the website: www.silversneakers.com or contact program at [phone] Mon-Fri between 8AM-8PM.   . Maintain a healthy weight. The body mass index (BMI) is a screening tool to identify possible weight problems. It provides an estimate of body fat based on height and weight. Your health care provider can find your BMI and can help you achieve or maintain a healthy weight.   For adults 20 years and older: ? A BMI below 18.5 is considered underweight. ? A BMI of 18.5 to 24.9 is normal. ? A BMI of 25 to 29.9 is considered overweight. ? A BMI of 30 and above is considered obese.   . Maintain normal blood lipids and cholesterol levels by exercising and minimizing your intake of saturated fat. Eat a balanced diet with plenty of fruit and vegetables. Blood tests for lipids  and cholesterol should begin at age 64 and be repeated every 5 years. If your lipid or cholesterol levels are high, you are over 50, or you are at high risk for heart disease, you may need your cholesterol levels checked more frequently. Ongoing high lipid and cholesterol levels should be treated with medicines if diet and exercise are not working.  . If you smoke, find out from your health care provider how to quit. If you do not use tobacco, please do not start.  . If you choose to drink alcohol, please do not consume more than 2 drinks per day. One drink is considered to be 12 ounces (355 mL) of  beer, 5 ounces (148 mL) of wine, or 1.5 ounces (44 mL) of liquor.  . If you are 64-14 years old, ask your health care provider if you should take aspirin to prevent strokes.  . Use sunscreen. Apply sunscreen liberally and repeatedly throughout the day. You should seek shade when your shadow is shorter than you. Protect yourself by wearing long sleeves, pants, a wide-brimmed hat, and sunglasses year round, whenever you are outdoors.  . Once a month, do a whole body skin exam, using a mirror to look at the skin on your back. Tell your health care provider of new moles, moles that have irregular borders, moles that are larger than a pencil eraser, or moles that have changed in shape or color.

## 2016-07-29 DIAGNOSIS — M6281 Muscle weakness (generalized): Secondary | ICD-10-CM | POA: Diagnosis not present

## 2016-07-29 DIAGNOSIS — R399 Unspecified symptoms and signs involving the genitourinary system: Secondary | ICD-10-CM | POA: Diagnosis not present

## 2016-07-29 DIAGNOSIS — R278 Other lack of coordination: Secondary | ICD-10-CM | POA: Diagnosis not present

## 2016-08-26 DIAGNOSIS — R278 Other lack of coordination: Secondary | ICD-10-CM | POA: Diagnosis not present

## 2016-08-26 DIAGNOSIS — R399 Unspecified symptoms and signs involving the genitourinary system: Secondary | ICD-10-CM | POA: Diagnosis not present

## 2016-08-26 DIAGNOSIS — M6281 Muscle weakness (generalized): Secondary | ICD-10-CM | POA: Diagnosis not present

## 2016-09-26 DIAGNOSIS — M6281 Muscle weakness (generalized): Secondary | ICD-10-CM | POA: Diagnosis not present

## 2016-09-26 DIAGNOSIS — R399 Unspecified symptoms and signs involving the genitourinary system: Secondary | ICD-10-CM | POA: Diagnosis not present

## 2016-09-26 DIAGNOSIS — R278 Other lack of coordination: Secondary | ICD-10-CM | POA: Diagnosis not present

## 2016-10-10 ENCOUNTER — Other Ambulatory Visit: Payer: Self-pay | Admitting: Internal Medicine

## 2016-10-21 ENCOUNTER — Ambulatory Visit (INDEPENDENT_AMBULATORY_CARE_PROVIDER_SITE_OTHER): Payer: Medicare Other | Admitting: Nurse Practitioner

## 2016-10-21 ENCOUNTER — Encounter: Payer: Self-pay | Admitting: Nurse Practitioner

## 2016-10-21 VITALS — BP 122/78 | HR 62 | Temp 98.2°F | Resp 17 | Ht 64.0 in | Wt 133.4 lb

## 2016-10-21 DIAGNOSIS — F0280 Dementia in other diseases classified elsewhere without behavioral disturbance: Secondary | ICD-10-CM

## 2016-10-21 DIAGNOSIS — G309 Alzheimer's disease, unspecified: Secondary | ICD-10-CM | POA: Diagnosis not present

## 2016-10-21 DIAGNOSIS — R05 Cough: Secondary | ICD-10-CM

## 2016-10-21 DIAGNOSIS — F0150 Vascular dementia without behavioral disturbance: Secondary | ICD-10-CM | POA: Diagnosis not present

## 2016-10-21 DIAGNOSIS — R5381 Other malaise: Secondary | ICD-10-CM | POA: Diagnosis not present

## 2016-10-21 DIAGNOSIS — R059 Cough, unspecified: Secondary | ICD-10-CM

## 2016-10-21 NOTE — Patient Instructions (Addendum)
mucinex DM by mouth twice daily with full glass of water for chest congestion and cough.

## 2016-10-21 NOTE — Progress Notes (Signed)
Careteam: Patient Care Team: Kirt Boysarter, Monica, DO as PCP - General (Internal Medicine)  Advanced Directive information Does Patient Have a Medical Advance Directive?: Yes, Type of Advance Directive: Healthcare Power of Attorney  No Known Allergies  Chief Complaint  Patient presents with  . Acute Visit    Pt has had deep, crackling cough x 5 days. Pt has had increased left side weakness over last 30 days.      HPI: Patient is a 83 y.o. female seen in the office today due to cough. Pt with hx of dementia, htn, hyperlipidemia.  Pt reports cough for 5 days. Has been getting better. Had lost her voice as well but now feels well.  Deep cough. Nonproductive.  No shortness of breath or chest congestion. No chest pains.  No nasal congestion, no sore throat.  Has environmental allergies but zyrtec has not really helped.  Giving her benzonatate which has been effective.   Weakness has gotten progressively worse over the last month. She was becoming slower with gait and therefore she started using the WC. Now weakness has increased greatly. Having a harder time pivoting. No falls.  Review of Systems:  Review of Systems  Constitutional: Negative for activity change, appetite change, chills, fatigue and fever.  HENT: Positive for rhinorrhea. Negative for congestion, sinus pressure, sore throat and voice change.   Respiratory: Negative for shortness of breath.   Cardiovascular: Negative for leg swelling.  Allergic/Immunologic: Positive for environmental allergies.  Neurological: Negative for weakness.    Past Medical History:  Diagnosis Date  . Cardiac catheterisation as the cause of abnormal reaction of patient, or of later complication 10/05/2006  . Dysphagia, unspecified(787.20)   . Encounter for long-term (current) use of other medications   . High cholesterol   . Hyperosmolality and/or hypernatremia   . Hypertension   . Osteoporosis   . Other atopic dermatitis and related  conditions   . Type II or unspecified type diabetes mellitus without mention of complication, not stated as uncontrolled   . Vascular dementia 06/22/2013   Past Surgical History:  Procedure Laterality Date  . FEMUR IM NAIL  07/24/2011   Procedure: INTRAMEDULLARY (IM) NAIL FEMORAL;  Surgeon: Javier DockerJeffrey C Beane, MD;  Location: [facility];  Service: Orthopedics;  Laterality: Right;  Biomet, c-arm, Jackson table  . FEMUR SURGERY Right 03/2010  . FRACTURE SURGERY Left    Hip, Leg and shoulder  . INTRAMEDULLARY (IM) NAIL INTERTROCHANTERIC Left 07/30/2013   Procedure: INTRAMEDULLARY (IM) NAIL INTERTROCHANTRIC LEFT;  Surgeon: Shelda PalMatthew D Olin, MD;  Location: [facility];  Service: Orthopedics;  Laterality: Left;  . SHOULDER SURGERY Left 03/2010   Social History:   reports that she has never smoked. She has never used smokeless tobacco. She reports that she drinks about 0.6 oz of alcohol per week . She reports that she does not use drugs.  Family History  Problem Relation Age of Onset  . Cancer Sister   . Heart disease Father     Medications: Patient's Medications  New Prescriptions   No medications on file  Previous Medications   ACETAMINOPHEN (TYLENOL) 500 MG TABLET    Take 1 tablet (500 mg total) by mouth 3 (three) times daily. For one week, then tid prn thereafter   ASPIRIN EC 81 MG TABLET    Take 1 tablet (81 mg total) by mouth daily.   BENZONATATE (TESSALON) 100 MG CAPSULE    Take 100 mg by mouth 3 (three) times daily as needed for cough.  CETIRIZINE (ZYRTEC) 5 MG TABLET    Take 1 tablet (5 mg total) by mouth daily.   COENZYME Q10 (CO Q 10) 100 MG CAPS    Take 1 tablet by mouth 2 (two) times daily.   COLESTIPOL (COLESTID) 1 G TABLET    Take 1 tablet (1 g total) by mouth daily.   DIPHENOXYLATE-ATROPINE (LOMOTIL) 2.5-0.025 MG TABLET    Take one tablet by mouth three times daily as needed for diarrhea (control)   DONEPEZIL (ARICEPT) 10 MG TABLET    Take 10 mg by mouth at bedtime.   LOSARTAN (COZAAR)  100 MG TABLET    Take 100 mg by mouth daily.   MELATONIN 3 MG CAPS    Take 1 capsule (3 mg total) by mouth at bedtime and may repeat dose one time if needed.   METOPROLOL TARTRATE (LOPRESSOR) 25 MG TABLET    Take 25 mg by mouth 2 (two) times daily.   MULTIPLE VITAMIN (MULITIVITAMIN WITH MINERALS) TABS    Take 1 tablet by mouth daily.    TRIAMCINOLONE ACETONIDE (TRIAMCINOLONE 0.1 % CREAM : EUCERIN) CREA    Apply 1 application topically daily as needed for rash (on face).   VAYACOG 100-19.5-6.5 MG CAPS    TAKE 1 CAPSULE EVERY DAY.  Modified Medications   No medications on file  Discontinued Medications   No medications on file     Physical Exam:  Vitals:   10/21/16 1503  BP: 122/78  Pulse: 62  Resp: 17  Temp: 98.2 F (36.8 C)  TempSrc: Oral  SpO2: 97%  Weight: 133 lb 6.4 oz (60.5 kg)  Height: 5\' 4"  (1.626 m)   Body mass index is 22.9 kg/m.  Physical Exam  Constitutional: She appears well-developed.  Frail appearing in NAD  HENT:  Mouth/Throat: Oropharynx is clear and moist. No oropharyngeal exudate.  Eyes: Pupils are equal, round, and reactive to light. No scleral icterus.  Neck: Neck supple. Carotid bruit is not present. No tracheal deviation present. No thyromegaly present.  Cardiovascular: Normal rate, regular rhythm and intact distal pulses.  Exam reveals no gallop and no friction rub.   Murmur (1/6 SEM) heard. No LE edema b/l. no calf TTP.   Pulmonary/Chest: Effort normal and breath sounds normal. No stridor. No respiratory distress. She has no wheezes. She has no rales.  Abdominal: Soft. Bowel sounds are normal. She exhibits no distension and no mass. There is no hepatomegaly. There is no tenderness. There is no rebound and no guarding.  Musculoskeletal: She exhibits no edema or tenderness.  Thoracic kyphoscoliosis; sits in WC. Not walking today  Lymphadenopathy:    She has no cervical adenopathy.  Neurological: She is alert. Gait (slow and shuffling; unsteady)  abnormal.  Skin: Skin is warm and dry. No rash noted.  Psychiatric: She has a normal mood and affect. Her behavior is normal. Cognition and memory are impaired.    Labs reviewed: Basic Metabolic Panel:  Recent Labs  16/10/96 0402 02/08/16 2311 04/10/16 1529  NA 142 141 140  K 4.3 4.4 4.6  CL 112* 109 106  CO2 23 25 23   GLUCOSE 109* 105* 104*  BUN 31* 28* 23  CREATININE 1.00 0.87 1.20*  CALCIUM 9.1 9.4 9.2  TSH  --   --  1.84   Liver Function Tests:  Recent Labs  11/29/15 1555 04/10/16 1529  AST 20  --   ALT 13 13  ALKPHOS 73  --   BILITOT 0.5  --   PROT  7.7  --   ALBUMIN 4.3  --    No results for input(s): LIPASE, AMYLASE in the last 8760 hours. No results for input(s): AMMONIA in the last 8760 hours. CBC:  Recent Labs  11/29/15 1555 02/04/16 0402 02/08/16 2311  WBC 8.7 7.4 7.9  NEUTROABS 3.6 4.2 3.7  HGB  --  12.1 12.8  HCT 43.2 36.6 38.4  MCV 94 94.3 92.3  PLT 212 182 178   Lipid Panel:  Recent Labs  11/29/15 1555 04/10/16 1529  CHOL 235* 227*  HDL 50 45*  LDLCALC 131* 138*  TRIG 270* 218*  CHOLHDL 4.7* 5.0   TSH:  Recent Labs  04/10/16 1529  TSH 1.84   A1C: Lab Results  Component Value Date   HGBA1C 5.6 07/29/2013     Assessment/Plan 1. Cough Most likely viral, has already improved. To cont using benzonatate as needed cough. To call if symptoms worsen  2. Debility significantly worse over the last month; a combination of progressive dementia with decrease in activity. Interested in PT at this time.  - Ambulatory referral to Home Health  3. Mixed Alzheimer's and vascular dementia -no acute decline but progressive worsening. conts on aricept - pt request DNR (Do Not Resuscitate) form which was signed   Janene Harvey. Biagio Borg  [facility] [phone] 8 am - 5 pm) [phone] (after hours)

## 2016-10-26 DIAGNOSIS — R278 Other lack of coordination: Secondary | ICD-10-CM | POA: Diagnosis not present

## 2016-10-26 DIAGNOSIS — Z7982 Long term (current) use of aspirin: Secondary | ICD-10-CM | POA: Diagnosis not present

## 2016-10-26 DIAGNOSIS — R5381 Other malaise: Secondary | ICD-10-CM | POA: Diagnosis not present

## 2016-10-26 DIAGNOSIS — R2681 Unsteadiness on feet: Secondary | ICD-10-CM | POA: Diagnosis not present

## 2016-10-26 DIAGNOSIS — E119 Type 2 diabetes mellitus without complications: Secondary | ICD-10-CM | POA: Diagnosis not present

## 2016-10-26 DIAGNOSIS — G309 Alzheimer's disease, unspecified: Secondary | ICD-10-CM | POA: Diagnosis not present

## 2016-10-26 DIAGNOSIS — R399 Unspecified symptoms and signs involving the genitourinary system: Secondary | ICD-10-CM | POA: Diagnosis not present

## 2016-10-26 DIAGNOSIS — M6281 Muscle weakness (generalized): Secondary | ICD-10-CM | POA: Diagnosis not present

## 2016-10-26 DIAGNOSIS — I10 Essential (primary) hypertension: Secondary | ICD-10-CM | POA: Diagnosis not present

## 2016-10-26 DIAGNOSIS — M419 Scoliosis, unspecified: Secondary | ICD-10-CM | POA: Diagnosis not present

## 2016-10-26 DIAGNOSIS — F0280 Dementia in other diseases classified elsewhere without behavioral disturbance: Secondary | ICD-10-CM | POA: Diagnosis not present

## 2016-10-26 DIAGNOSIS — F0150 Vascular dementia without behavioral disturbance: Secondary | ICD-10-CM | POA: Diagnosis not present

## 2016-10-28 ENCOUNTER — Telehealth: Payer: Self-pay | Admitting: *Deleted

## 2016-10-28 NOTE — Telephone Encounter (Signed)
[facility] and wanted verbal orders for Patient for PT 1x1wk, 2x7wks. Verbal orders given.

## 2016-10-30 DIAGNOSIS — G309 Alzheimer's disease, unspecified: Secondary | ICD-10-CM | POA: Diagnosis not present

## 2016-10-30 DIAGNOSIS — E119 Type 2 diabetes mellitus without complications: Secondary | ICD-10-CM | POA: Diagnosis not present

## 2016-10-30 DIAGNOSIS — R2681 Unsteadiness on feet: Secondary | ICD-10-CM | POA: Diagnosis not present

## 2016-10-30 DIAGNOSIS — R5381 Other malaise: Secondary | ICD-10-CM | POA: Diagnosis not present

## 2016-10-30 DIAGNOSIS — Z7982 Long term (current) use of aspirin: Secondary | ICD-10-CM | POA: Diagnosis not present

## 2016-10-30 DIAGNOSIS — I10 Essential (primary) hypertension: Secondary | ICD-10-CM | POA: Diagnosis not present

## 2016-10-30 DIAGNOSIS — M419 Scoliosis, unspecified: Secondary | ICD-10-CM | POA: Diagnosis not present

## 2016-10-31 DIAGNOSIS — Z7982 Long term (current) use of aspirin: Secondary | ICD-10-CM | POA: Diagnosis not present

## 2016-10-31 DIAGNOSIS — G309 Alzheimer's disease, unspecified: Secondary | ICD-10-CM | POA: Diagnosis not present

## 2016-10-31 DIAGNOSIS — R2681 Unsteadiness on feet: Secondary | ICD-10-CM | POA: Diagnosis not present

## 2016-10-31 DIAGNOSIS — E119 Type 2 diabetes mellitus without complications: Secondary | ICD-10-CM | POA: Diagnosis not present

## 2016-10-31 DIAGNOSIS — M419 Scoliosis, unspecified: Secondary | ICD-10-CM | POA: Diagnosis not present

## 2016-10-31 DIAGNOSIS — I10 Essential (primary) hypertension: Secondary | ICD-10-CM | POA: Diagnosis not present

## 2016-10-31 DIAGNOSIS — R5381 Other malaise: Secondary | ICD-10-CM | POA: Diagnosis not present

## 2016-11-06 DIAGNOSIS — R2681 Unsteadiness on feet: Secondary | ICD-10-CM | POA: Diagnosis not present

## 2016-11-06 DIAGNOSIS — I10 Essential (primary) hypertension: Secondary | ICD-10-CM | POA: Diagnosis not present

## 2016-11-06 DIAGNOSIS — E119 Type 2 diabetes mellitus without complications: Secondary | ICD-10-CM | POA: Diagnosis not present

## 2016-11-06 DIAGNOSIS — R5381 Other malaise: Secondary | ICD-10-CM | POA: Diagnosis not present

## 2016-11-06 DIAGNOSIS — G309 Alzheimer's disease, unspecified: Secondary | ICD-10-CM | POA: Diagnosis not present

## 2016-11-06 DIAGNOSIS — M419 Scoliosis, unspecified: Secondary | ICD-10-CM | POA: Diagnosis not present

## 2016-11-06 DIAGNOSIS — Z7982 Long term (current) use of aspirin: Secondary | ICD-10-CM | POA: Diagnosis not present

## 2016-11-08 DIAGNOSIS — E119 Type 2 diabetes mellitus without complications: Secondary | ICD-10-CM | POA: Diagnosis not present

## 2016-11-08 DIAGNOSIS — Z7982 Long term (current) use of aspirin: Secondary | ICD-10-CM | POA: Diagnosis not present

## 2016-11-08 DIAGNOSIS — G309 Alzheimer's disease, unspecified: Secondary | ICD-10-CM | POA: Diagnosis not present

## 2016-11-08 DIAGNOSIS — R2681 Unsteadiness on feet: Secondary | ICD-10-CM | POA: Diagnosis not present

## 2016-11-08 DIAGNOSIS — I10 Essential (primary) hypertension: Secondary | ICD-10-CM | POA: Diagnosis not present

## 2016-11-08 DIAGNOSIS — R5381 Other malaise: Secondary | ICD-10-CM | POA: Diagnosis not present

## 2016-11-08 DIAGNOSIS — M419 Scoliosis, unspecified: Secondary | ICD-10-CM | POA: Diagnosis not present

## 2016-11-12 DIAGNOSIS — R2681 Unsteadiness on feet: Secondary | ICD-10-CM | POA: Diagnosis not present

## 2016-11-12 DIAGNOSIS — Z7982 Long term (current) use of aspirin: Secondary | ICD-10-CM | POA: Diagnosis not present

## 2016-11-12 DIAGNOSIS — I10 Essential (primary) hypertension: Secondary | ICD-10-CM | POA: Diagnosis not present

## 2016-11-12 DIAGNOSIS — G309 Alzheimer's disease, unspecified: Secondary | ICD-10-CM | POA: Diagnosis not present

## 2016-11-12 DIAGNOSIS — M419 Scoliosis, unspecified: Secondary | ICD-10-CM | POA: Diagnosis not present

## 2016-11-12 DIAGNOSIS — R5381 Other malaise: Secondary | ICD-10-CM | POA: Diagnosis not present

## 2016-11-12 DIAGNOSIS — E119 Type 2 diabetes mellitus without complications: Secondary | ICD-10-CM | POA: Diagnosis not present

## 2016-11-15 DIAGNOSIS — I10 Essential (primary) hypertension: Secondary | ICD-10-CM | POA: Diagnosis not present

## 2016-11-15 DIAGNOSIS — G309 Alzheimer's disease, unspecified: Secondary | ICD-10-CM | POA: Diagnosis not present

## 2016-11-15 DIAGNOSIS — E119 Type 2 diabetes mellitus without complications: Secondary | ICD-10-CM | POA: Diagnosis not present

## 2016-11-15 DIAGNOSIS — R5381 Other malaise: Secondary | ICD-10-CM | POA: Diagnosis not present

## 2016-11-15 DIAGNOSIS — M419 Scoliosis, unspecified: Secondary | ICD-10-CM | POA: Diagnosis not present

## 2016-11-15 DIAGNOSIS — R2681 Unsteadiness on feet: Secondary | ICD-10-CM | POA: Diagnosis not present

## 2016-11-15 DIAGNOSIS — Z7982 Long term (current) use of aspirin: Secondary | ICD-10-CM | POA: Diagnosis not present

## 2016-11-19 DIAGNOSIS — R2681 Unsteadiness on feet: Secondary | ICD-10-CM | POA: Diagnosis not present

## 2016-11-19 DIAGNOSIS — R5381 Other malaise: Secondary | ICD-10-CM | POA: Diagnosis not present

## 2016-11-19 DIAGNOSIS — I10 Essential (primary) hypertension: Secondary | ICD-10-CM | POA: Diagnosis not present

## 2016-11-19 DIAGNOSIS — M419 Scoliosis, unspecified: Secondary | ICD-10-CM | POA: Diagnosis not present

## 2016-11-19 DIAGNOSIS — E119 Type 2 diabetes mellitus without complications: Secondary | ICD-10-CM | POA: Diagnosis not present

## 2016-11-19 DIAGNOSIS — Z7982 Long term (current) use of aspirin: Secondary | ICD-10-CM | POA: Diagnosis not present

## 2016-11-19 DIAGNOSIS — G309 Alzheimer's disease, unspecified: Secondary | ICD-10-CM | POA: Diagnosis not present

## 2016-11-22 DIAGNOSIS — G309 Alzheimer's disease, unspecified: Secondary | ICD-10-CM | POA: Diagnosis not present

## 2016-11-22 DIAGNOSIS — R5381 Other malaise: Secondary | ICD-10-CM | POA: Diagnosis not present

## 2016-11-22 DIAGNOSIS — E119 Type 2 diabetes mellitus without complications: Secondary | ICD-10-CM | POA: Diagnosis not present

## 2016-11-22 DIAGNOSIS — M419 Scoliosis, unspecified: Secondary | ICD-10-CM | POA: Diagnosis not present

## 2016-11-22 DIAGNOSIS — Z7982 Long term (current) use of aspirin: Secondary | ICD-10-CM | POA: Diagnosis not present

## 2016-11-22 DIAGNOSIS — I10 Essential (primary) hypertension: Secondary | ICD-10-CM | POA: Diagnosis not present

## 2016-11-22 DIAGNOSIS — R2681 Unsteadiness on feet: Secondary | ICD-10-CM | POA: Diagnosis not present

## 2016-11-25 DIAGNOSIS — I10 Essential (primary) hypertension: Secondary | ICD-10-CM | POA: Diagnosis not present

## 2016-11-25 DIAGNOSIS — E119 Type 2 diabetes mellitus without complications: Secondary | ICD-10-CM | POA: Diagnosis not present

## 2016-11-25 DIAGNOSIS — R5381 Other malaise: Secondary | ICD-10-CM | POA: Diagnosis not present

## 2016-11-25 DIAGNOSIS — R2681 Unsteadiness on feet: Secondary | ICD-10-CM | POA: Diagnosis not present

## 2016-11-25 DIAGNOSIS — Z7982 Long term (current) use of aspirin: Secondary | ICD-10-CM | POA: Diagnosis not present

## 2016-11-25 DIAGNOSIS — G309 Alzheimer's disease, unspecified: Secondary | ICD-10-CM | POA: Diagnosis not present

## 2016-11-25 DIAGNOSIS — M419 Scoliosis, unspecified: Secondary | ICD-10-CM | POA: Diagnosis not present

## 2016-11-26 DIAGNOSIS — R278 Other lack of coordination: Secondary | ICD-10-CM | POA: Diagnosis not present

## 2016-11-26 DIAGNOSIS — M6281 Muscle weakness (generalized): Secondary | ICD-10-CM | POA: Diagnosis not present

## 2016-11-26 DIAGNOSIS — R399 Unspecified symptoms and signs involving the genitourinary system: Secondary | ICD-10-CM | POA: Diagnosis not present

## 2016-11-27 DIAGNOSIS — R5381 Other malaise: Secondary | ICD-10-CM | POA: Diagnosis not present

## 2016-11-27 DIAGNOSIS — R2681 Unsteadiness on feet: Secondary | ICD-10-CM | POA: Diagnosis not present

## 2016-11-27 DIAGNOSIS — E119 Type 2 diabetes mellitus without complications: Secondary | ICD-10-CM | POA: Diagnosis not present

## 2016-11-27 DIAGNOSIS — G309 Alzheimer's disease, unspecified: Secondary | ICD-10-CM | POA: Diagnosis not present

## 2016-11-27 DIAGNOSIS — Z7982 Long term (current) use of aspirin: Secondary | ICD-10-CM | POA: Diagnosis not present

## 2016-11-27 DIAGNOSIS — M419 Scoliosis, unspecified: Secondary | ICD-10-CM | POA: Diagnosis not present

## 2016-11-27 DIAGNOSIS — I10 Essential (primary) hypertension: Secondary | ICD-10-CM | POA: Diagnosis not present

## 2016-12-03 DIAGNOSIS — R5381 Other malaise: Secondary | ICD-10-CM | POA: Diagnosis not present

## 2016-12-03 DIAGNOSIS — I10 Essential (primary) hypertension: Secondary | ICD-10-CM | POA: Diagnosis not present

## 2016-12-03 DIAGNOSIS — E119 Type 2 diabetes mellitus without complications: Secondary | ICD-10-CM | POA: Diagnosis not present

## 2016-12-03 DIAGNOSIS — G309 Alzheimer's disease, unspecified: Secondary | ICD-10-CM | POA: Diagnosis not present

## 2016-12-03 DIAGNOSIS — M419 Scoliosis, unspecified: Secondary | ICD-10-CM | POA: Diagnosis not present

## 2016-12-03 DIAGNOSIS — Z7982 Long term (current) use of aspirin: Secondary | ICD-10-CM | POA: Diagnosis not present

## 2016-12-03 DIAGNOSIS — R2681 Unsteadiness on feet: Secondary | ICD-10-CM | POA: Diagnosis not present

## 2016-12-05 DIAGNOSIS — I10 Essential (primary) hypertension: Secondary | ICD-10-CM | POA: Diagnosis not present

## 2016-12-05 DIAGNOSIS — R2681 Unsteadiness on feet: Secondary | ICD-10-CM | POA: Diagnosis not present

## 2016-12-05 DIAGNOSIS — R5381 Other malaise: Secondary | ICD-10-CM | POA: Diagnosis not present

## 2016-12-05 DIAGNOSIS — E119 Type 2 diabetes mellitus without complications: Secondary | ICD-10-CM | POA: Diagnosis not present

## 2016-12-05 DIAGNOSIS — G309 Alzheimer's disease, unspecified: Secondary | ICD-10-CM | POA: Diagnosis not present

## 2016-12-05 DIAGNOSIS — M419 Scoliosis, unspecified: Secondary | ICD-10-CM | POA: Diagnosis not present

## 2016-12-05 DIAGNOSIS — Z7982 Long term (current) use of aspirin: Secondary | ICD-10-CM | POA: Diagnosis not present

## 2016-12-10 DIAGNOSIS — E119 Type 2 diabetes mellitus without complications: Secondary | ICD-10-CM | POA: Diagnosis not present

## 2016-12-10 DIAGNOSIS — G309 Alzheimer's disease, unspecified: Secondary | ICD-10-CM | POA: Diagnosis not present

## 2016-12-10 DIAGNOSIS — M419 Scoliosis, unspecified: Secondary | ICD-10-CM | POA: Diagnosis not present

## 2016-12-10 DIAGNOSIS — R2681 Unsteadiness on feet: Secondary | ICD-10-CM | POA: Diagnosis not present

## 2016-12-10 DIAGNOSIS — Z7982 Long term (current) use of aspirin: Secondary | ICD-10-CM | POA: Diagnosis not present

## 2016-12-10 DIAGNOSIS — I10 Essential (primary) hypertension: Secondary | ICD-10-CM | POA: Diagnosis not present

## 2016-12-10 DIAGNOSIS — R5381 Other malaise: Secondary | ICD-10-CM | POA: Diagnosis not present

## 2016-12-12 DIAGNOSIS — Z7982 Long term (current) use of aspirin: Secondary | ICD-10-CM | POA: Diagnosis not present

## 2016-12-12 DIAGNOSIS — R5381 Other malaise: Secondary | ICD-10-CM | POA: Diagnosis not present

## 2016-12-12 DIAGNOSIS — M419 Scoliosis, unspecified: Secondary | ICD-10-CM | POA: Diagnosis not present

## 2016-12-12 DIAGNOSIS — G309 Alzheimer's disease, unspecified: Secondary | ICD-10-CM | POA: Diagnosis not present

## 2016-12-12 DIAGNOSIS — I10 Essential (primary) hypertension: Secondary | ICD-10-CM | POA: Diagnosis not present

## 2016-12-12 DIAGNOSIS — R2681 Unsteadiness on feet: Secondary | ICD-10-CM | POA: Diagnosis not present

## 2016-12-12 DIAGNOSIS — E119 Type 2 diabetes mellitus without complications: Secondary | ICD-10-CM | POA: Diagnosis not present

## 2016-12-13 ENCOUNTER — Telehealth: Payer: Self-pay

## 2016-12-13 DIAGNOSIS — R05 Cough: Secondary | ICD-10-CM | POA: Diagnosis not present

## 2016-12-13 NOTE — Telephone Encounter (Signed)
Ms. Carolyn StareVanself called and said that Beverly Barnett is taking benzonate 100 mg q8h prn for a cough and its not working and to know can it be scheduled to bid or changed to something else.   Please advise

## 2016-12-13 NOTE — Telephone Encounter (Signed)
If pt is still coughing, she needs to be seen again. She may need further testing

## 2016-12-14 IMAGING — CR DG CHEST 2V
2 series · 2 of 2 positions shown · non-contrast
Comparison: Radiograph dated [DATE]

CLINICAL DATA: 89-year-old female with dry cough

EXAM:
CHEST  2 VIEW

[w chest lat]
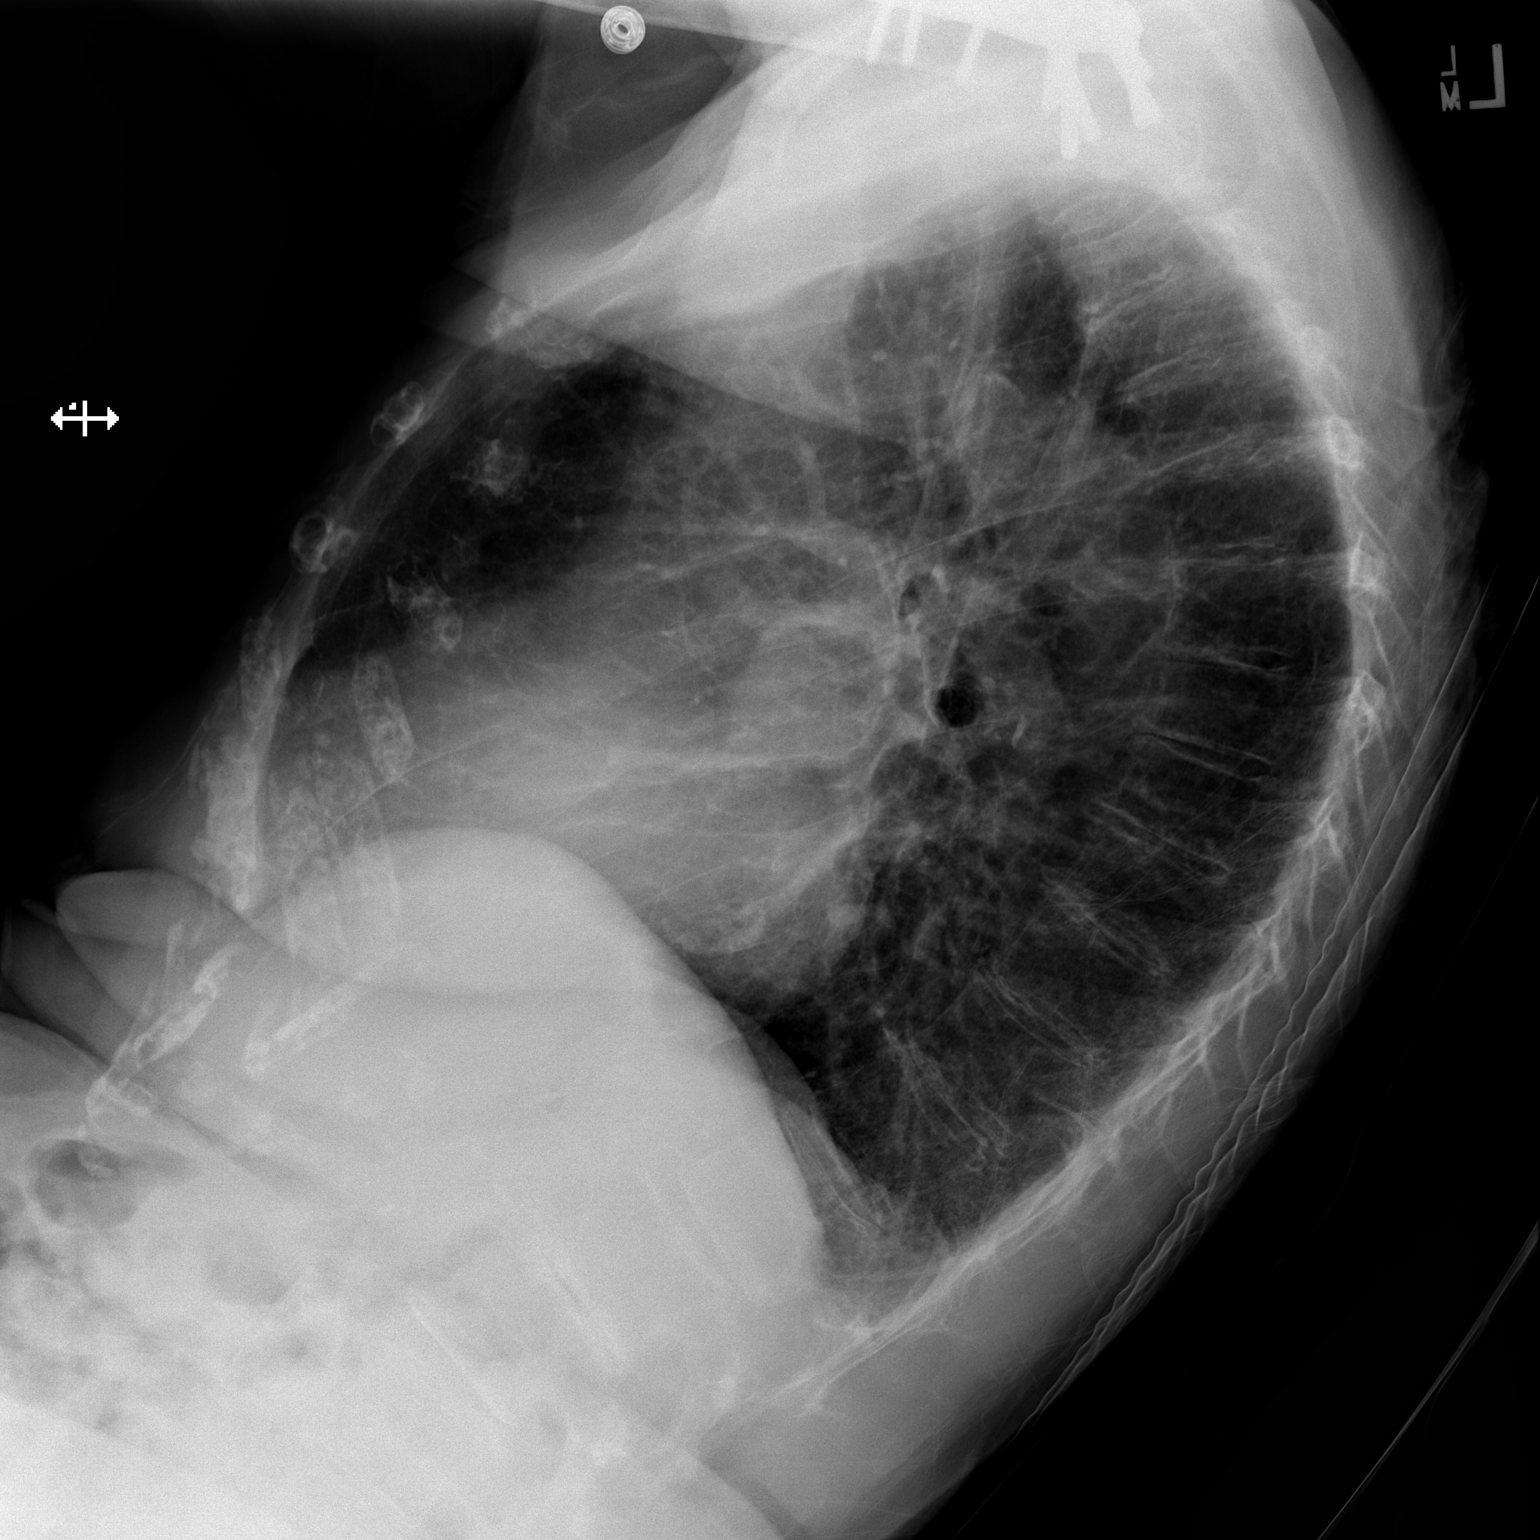

[x chest ap]
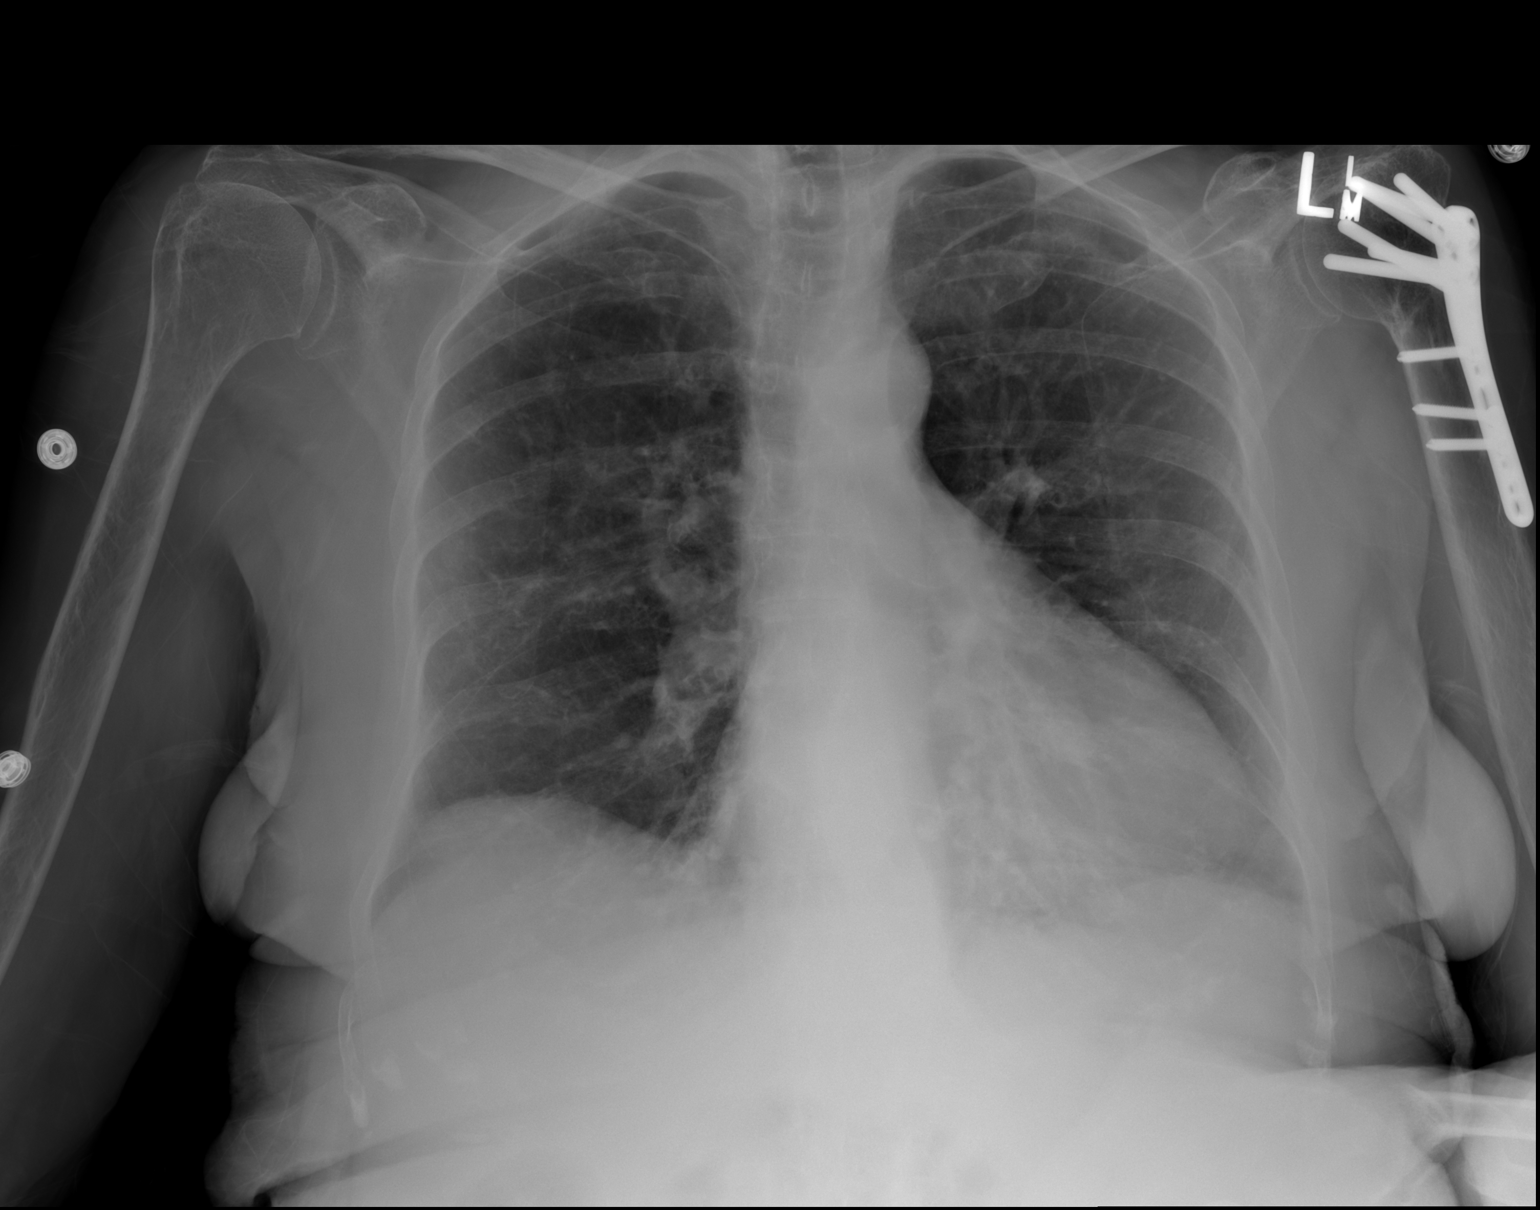

[2 of 2 positions shown; findings below may reference images not displayed]

FINDINGS: Two views of the chest demonstrate emphysematous changes of the
lungs. No focal consolidation, pleural effusion, or pneumothorax.
Stable cardiac silhouette. Advanced osteopenia with degenerative
changes of the spine. Left humeral fixation plate.
IMPRESSION: No active cardiopulmonary disease.

## 2016-12-16 ENCOUNTER — Telehealth: Payer: Self-pay | Admitting: *Deleted

## 2016-12-16 MED ORDER — AMOXICILLIN-POT CLAVULANATE 875-125 MG PO TABS: 1.0000 | ORAL_TABLET | Freq: Two times a day (BID) | ORAL | 0 refills | Status: AC

## 2016-12-16 NOTE — Telephone Encounter (Signed)
Spoke with patient's daughter and advised results, rx sent to pharmacy by e-script Patient have appt scheduled tomorrow

## 2016-12-16 NOTE — Telephone Encounter (Signed)
Spoke with Amy, scheduled appointment for tomorrow with Dr.Carter.

## 2016-12-17 ENCOUNTER — Encounter: Payer: Self-pay | Admitting: Internal Medicine

## 2016-12-17 ENCOUNTER — Ambulatory Visit (INDEPENDENT_AMBULATORY_CARE_PROVIDER_SITE_OTHER): Payer: Medicare Other | Admitting: Internal Medicine

## 2016-12-17 VITALS — BP 126/72 | HR 64 | Temp 98.6°F

## 2016-12-17 DIAGNOSIS — J181 Lobar pneumonia, unspecified organism: Secondary | ICD-10-CM

## 2016-12-17 DIAGNOSIS — R05 Cough: Secondary | ICD-10-CM | POA: Diagnosis not present

## 2016-12-17 DIAGNOSIS — R059 Cough, unspecified: Secondary | ICD-10-CM | POA: Insufficient documentation

## 2016-12-17 DIAGNOSIS — J189 Pneumonia, unspecified organism: Secondary | ICD-10-CM | POA: Insufficient documentation

## 2016-12-17 MED ORDER — HYDROCODONE-HOMATROPINE 5-1.5 MG/5ML PO SYRP: 5.0000 mL | ORAL_SOLUTION | Freq: Three times a day (TID) | ORAL | 0 refills | Status: DC | PRN

## 2016-12-17 MED ORDER — LEVOFLOXACIN 500 MG PO TABS: 500.0000 mg | ORAL_TABLET | Freq: Every day | ORAL | 0 refills | Status: DC

## 2016-12-17 MED ORDER — ALBUTEROL SULFATE (2.5 MG/3ML) 0.083% IN NEBU: INHALATION_SOLUTION | RESPIRATORY_TRACT | 1 refills | Status: DC

## 2016-12-17 MED ORDER — IPRATROPIUM-ALBUTEROL 0.5-2.5 (3) MG/3ML IN SOLN: 3.0000 mL | Freq: Four times a day (QID) | RESPIRATORY_TRACT | Status: DC

## 2016-12-17 NOTE — Progress Notes (Signed)
Patient ID: Beverly Barnett, female   DOB: 04/14/1924, 83 y.o.   MRN: [id number]    Location:  [facility] Place of Service: OFFICE  Chief Complaint  Patient presents with  . Acute Visit    Cough started about 3 weeks ago    HPI:  83 yo female seen today for 3 week hx wet cough. She had a CXR on 12/13/16 that revealed LLL pneumonia. CXR report faxed to office yesterday and she was Rx Augmentin 875 BID x 10 days with floraster. She does not know if she has started abx yet. She is a poor historian due to dementia. Hx obtained from chart and son. Per son, pt with decreased level of function since illness and she does not help with ADLs or transfers like she normally would.    Past Medical History:  Diagnosis Date  . Cardiac catheterisation as the cause of abnormal reaction of patient, or of later complication 10/05/2006  . Dysphagia, unspecified(787.20)   . Encounter for long-term (current) use of other medications   . High cholesterol   . Hyperosmolality and/or hypernatremia   . Hypertension   . Osteoporosis   . Other atopic dermatitis and related conditions   . Type II or unspecified type diabetes mellitus without mention of complication, not stated as uncontrolled   . Vascular dementia 06/22/2013    Past Surgical History:  Procedure Laterality Date  . FEMUR IM NAIL  07/24/2011   Procedure: INTRAMEDULLARY (IM) NAIL FEMORAL;  Surgeon: Javier DockerJeffrey C Beane, MD;  Location: [facility];  Service: Orthopedics;  Laterality: Right;  Biomet, c-arm, Jackson table  . FEMUR SURGERY Right 03/2010  . FRACTURE SURGERY Left    Hip, Leg and shoulder  . INTRAMEDULLARY (IM) NAIL INTERTROCHANTERIC Left 07/30/2013   Procedure: INTRAMEDULLARY (IM) NAIL INTERTROCHANTRIC LEFT;  Surgeon: Shelda PalMatthew D Olin, MD;  Location: [facility];  Service: Orthopedics;  Laterality: Left;  . SHOULDER SURGERY Left 03/2010    Patient Care Team: Kirt Boysarter, Seham Gardenhire, DO as PCP - General (Internal Medicine)  Social History   Social History  . Marital  status: Widowed    Spouse name: N/A  . Number of children: N/A  . Years of education: N/A   Occupational History  . Not on file.   Social History Main Topics  . Smoking status: Never Smoker  . Smokeless tobacco: Never Used  . Alcohol use 0.6 oz/week    1 Glasses of wine per week     Comment: occasionally  . Drug use: No  . Sexual activity: Not Currently   Other Topics Concern  . Not on file   Social History Narrative  . No narrative on file     reports that she has never smoked. She has never used smokeless tobacco. She reports that she drinks about 0.6 oz of alcohol per week . She reports that she does not use drugs.  Family History  Problem Relation Age of Onset  . Cancer Sister   . Heart disease Father    Family Status  Relation Status  . Sister Deceased  . Mother Deceased at age 81  . Father Deceased at age 81  . Sister Deceased  . Brother Deceased  . Daughter Alive  . Son Alive  . Brother Alive  . Son Alive     No Known Allergies  Medications: Patient's Medications  New Prescriptions   No medications on file  Previous Medications   ACETAMINOPHEN (TYLENOL) 500 MG TABLET    Take 1 tablet (500  mg total) by mouth 3 (three) times daily. For one week, then tid prn thereafter   AMOXICILLIN-CLAVULANATE (AUGMENTIN) 875-125 MG TABLET    Take 1 tablet by mouth 2 (two) times daily.   ASPIRIN EC 81 MG TABLET    Take 1 tablet (81 mg total) by mouth daily.   BENZONATATE (TESSALON) 100 MG CAPSULE    Take 100 mg by mouth 3 (three) times daily as needed for cough.   CETIRIZINE (ZYRTEC) 5 MG TABLET    Take 1 tablet (5 mg total) by mouth daily.   COENZYME Q10 (CO Q 10) 100 MG CAPS    Take 1 tablet by mouth 2 (two) times daily.   COLESTIPOL (COLESTID) 1 G TABLET    Take 1 tablet (1 g total) by mouth daily.   DIPHENOXYLATE-ATROPINE (LOMOTIL) 2.5-0.025 MG TABLET    Take one tablet by mouth three times daily as needed for diarrhea (control)   DONEPEZIL (ARICEPT) 10 MG TABLET     Take 10 mg by mouth at bedtime.   LOSARTAN (COZAAR) 100 MG TABLET    Take 100 mg by mouth daily.   MELATONIN 3 MG CAPS    Take 1 capsule (3 mg total) by mouth at bedtime and may repeat dose one time if needed.   METOPROLOL TARTRATE (LOPRESSOR) 25 MG TABLET    Take 25 mg by mouth 2 (two) times daily.   MULTIPLE VITAMIN (MULITIVITAMIN WITH MINERALS) TABS    Take 1 tablet by mouth daily.    SACCHAROMYCES BOULARDII (FLORASTOR) 250 MG CAPSULE    Take 250 mg by mouth 2 (two) times daily.   TRIAMCINOLONE ACETONIDE (TRIAMCINOLONE 0.1 % CREAM : EUCERIN) CREA    Apply 1 application topically daily as needed for rash (on face).   VAYACOG 100-19.5-6.5 MG CAPS    TAKE 1 CAPSULE EVERY DAY.  Modified Medications   No medications on file  Discontinued Medications   No medications on file    Review of Systems  Unable to perform ROS: Dementia    Vitals:   12/17/16 1423  BP: 126/72  Pulse: 64  Temp: 98.6 F (37 C)  TempSrc: Oral  SpO2: 96%   There is no height or weight on file to calculate BMI - pt in w/c today  Physical Exam  Constitutional: She appears well-developed.  frail appearing in NAD, sitting in w/c, looks ill  HENT:  Mouth/Throat: Oropharynx is clear and moist.  MMM; no oral thrush  Eyes: Pupils are equal, round, and reactive to light. No scleral icterus.  Neck: Neck supple.  Cardiovascular: Normal rate, regular rhythm and intact distal pulses.   Murmur (2/6 SEM) heard. Pulmonary/Chest: Effort normal. No respiratory distress. She has wheezes (b/l end expiratory with prolonged expiratory phase). She has rales (R>L base). She exhibits no tenderness.  Musculoskeletal: She exhibits edema.  Lymphadenopathy:    She has no cervical adenopathy.  Neurological: She is alert.  Skin: Skin is warm and dry. No rash noted.  Psychiatric: She has a normal mood and affect. Her behavior is normal.     Labs reviewed: No visits with results within 3 Month(s) from this visit.  Latest known  visit with results is:  Office Visit on 04/10/2016  Component Date Value Ref Range Status  . Sodium 04/10/2016 140  135 - 146 mmol/L Final  . Potassium 04/10/2016 4.6  3.5 - 5.3 mmol/L Final  . Chloride 04/10/2016 106  98 - 110 mmol/L Final  . CO2 04/10/2016 23  20 -  31 mmol/L Final  . Glucose, Bld 04/10/2016 104* 65 - 99 mg/dL Final  . BUN 16/03/9603 23  7 - 25 mg/dL Final  . Creat 54/02/8118 1.20* 0.60 - 0.88 mg/dL Final   Comment:   For patients > or = 81 years of age: The upper reference limit for Creatinine is approximately 13% higher for people identified as African-American.     . Calcium 04/10/2016 9.2  8.6 - 10.4 mg/dL Final  . GFR, Est African American 04/10/2016 46* >=60 mL/min Final  . GFR, Est Non African American 04/10/2016 40* >=60 mL/min Final  . Cholesterol 04/10/2016 227* 125 - 200 mg/dL Final  . Triglycerides 04/10/2016 218* <150 mg/dL Final  . HDL 14/78/2956 45* >=46 mg/dL Final  . Total CHOL/HDL Ratio 04/10/2016 5.0  <=2.1 Ratio Final  . VLDL 04/10/2016 44* <30 mg/dL Final  . LDL Cholesterol 04/10/2016 138* <130 mg/dL Final   Comment:   Total Cholesterol/HDL Ratio:CHD Risk                        Coronary Heart Disease Risk Table                                        Men       Women          1/2 Average Risk              3.4        3.3              Average Risk              5.0        4.4           2X Average Risk              9.6        7.1           3X Average Risk             23.4       11.0 Use the calculated Patient Ratio above and the CHD Risk table  to determine the patient's CHD Risk.   Marland Kitchen ALT 04/10/2016 13  6 - 29 U/L Final  . TSH 04/10/2016 1.84  mIU/L Final   Comment:   Reference Range   > or = 20 Years  0.40-4.50   Pregnancy Range First trimester  0.26-2.66 Second trimester 0.55-2.73 Third trimester  0.43-2.91       No results found.   Assessment/Plan   ICD-10-CM   1. Pneumonia of left lower lobe due to infectious organism (HCC)  J18.1 albuterol (PROVENTIL) (2.5 MG/3ML) 0.083% nebulizer solution    levofloxacin (LEVAQUIN) 500 MG tablet    ipratropium-albuterol (DUONEB) 0.5-2.5 (3) MG/3ML nebulizer solution 3 mL  2. Cough R05 HYDROcodone-homatropine (HYCODAN) 5-1.5 MG/5ML syrup    ipratropium-albuterol (DUONEB) 0.5-2.5 (3) MG/3ML nebulizer solution 3 mL     Post neb tx - improved inspiratory effort with reduced wheezing  T/c steroid taper if no improvement with abx and neb tx  STOP AUGMENTIN  START LEVAQUIN 500mg  daily x 10 days. 1st dose today  Continue floraster as ordered  Albuterol nebulizer 0.083% (2.5mg /28ml) 1 vial via neb 3 times daily x 7 days then 2 times daily x 7 days then 1 time daily x 7 days and  stop  Start hycodan 5ml by mouth every 8hrs as needed for cough  STOP tessalon perles  Continue other medications as ordered  Push fluids and rest  Follow up in 2 weeks for pneumonia, HTN and DM. May need repeat CXR   [facility] S. Ancil Linsey  [facility] and Adult [facility] [address] [place], [place] [address] [phone] Cell (Monday-Friday 8 AM - 5 PM) [phone] After 5 PM and follow prompts

## 2016-12-17 NOTE — Patient Instructions (Addendum)
STOP AUGMENTIN  START LEVAQUIN 500mg  daily x 10 days. 1st dose today  Continue floraster as ordered  Albuterol nebulizer 0.083% (2.5mg /283ml) 1 vial via neb 3 times daily x 7 days then 2 times daily x 7 days then 1 time daily x 7 days and stop  Start hycodan 5ml by mouth every 8hrs as needed for cough  STOP tessalon perles  Continue other medications as ordered  Push fluids and rest  Follow up in 2 weeks for pneumonia, HTN and DM

## 2016-12-18 DIAGNOSIS — R05 Cough: Secondary | ICD-10-CM | POA: Diagnosis not present

## 2016-12-19 DIAGNOSIS — R5381 Other malaise: Secondary | ICD-10-CM | POA: Diagnosis not present

## 2016-12-19 DIAGNOSIS — Z7982 Long term (current) use of aspirin: Secondary | ICD-10-CM | POA: Diagnosis not present

## 2016-12-19 DIAGNOSIS — E119 Type 2 diabetes mellitus without complications: Secondary | ICD-10-CM | POA: Diagnosis not present

## 2016-12-19 DIAGNOSIS — G309 Alzheimer's disease, unspecified: Secondary | ICD-10-CM | POA: Diagnosis not present

## 2016-12-19 DIAGNOSIS — I10 Essential (primary) hypertension: Secondary | ICD-10-CM | POA: Diagnosis not present

## 2016-12-19 DIAGNOSIS — R2681 Unsteadiness on feet: Secondary | ICD-10-CM | POA: Diagnosis not present

## 2016-12-19 DIAGNOSIS — M419 Scoliosis, unspecified: Secondary | ICD-10-CM | POA: Diagnosis not present

## 2016-12-20 DIAGNOSIS — Z7982 Long term (current) use of aspirin: Secondary | ICD-10-CM | POA: Diagnosis not present

## 2016-12-20 DIAGNOSIS — E119 Type 2 diabetes mellitus without complications: Secondary | ICD-10-CM | POA: Diagnosis not present

## 2016-12-20 DIAGNOSIS — R5381 Other malaise: Secondary | ICD-10-CM | POA: Diagnosis not present

## 2016-12-20 DIAGNOSIS — M419 Scoliosis, unspecified: Secondary | ICD-10-CM | POA: Diagnosis not present

## 2016-12-20 DIAGNOSIS — G309 Alzheimer's disease, unspecified: Secondary | ICD-10-CM | POA: Diagnosis not present

## 2016-12-20 DIAGNOSIS — I10 Essential (primary) hypertension: Secondary | ICD-10-CM | POA: Diagnosis not present

## 2016-12-20 DIAGNOSIS — R2681 Unsteadiness on feet: Secondary | ICD-10-CM | POA: Diagnosis not present

## 2016-12-24 DIAGNOSIS — M419 Scoliosis, unspecified: Secondary | ICD-10-CM | POA: Diagnosis not present

## 2016-12-24 DIAGNOSIS — R5381 Other malaise: Secondary | ICD-10-CM | POA: Diagnosis not present

## 2016-12-24 DIAGNOSIS — Z7982 Long term (current) use of aspirin: Secondary | ICD-10-CM | POA: Diagnosis not present

## 2016-12-24 DIAGNOSIS — R2681 Unsteadiness on feet: Secondary | ICD-10-CM | POA: Diagnosis not present

## 2016-12-24 DIAGNOSIS — E119 Type 2 diabetes mellitus without complications: Secondary | ICD-10-CM | POA: Diagnosis not present

## 2016-12-24 DIAGNOSIS — G309 Alzheimer's disease, unspecified: Secondary | ICD-10-CM | POA: Diagnosis not present

## 2016-12-24 DIAGNOSIS — I10 Essential (primary) hypertension: Secondary | ICD-10-CM | POA: Diagnosis not present

## 2016-12-25 ENCOUNTER — Telehealth: Payer: Self-pay | Admitting: *Deleted

## 2016-12-25 DIAGNOSIS — I10 Essential (primary) hypertension: Secondary | ICD-10-CM | POA: Diagnosis not present

## 2016-12-25 DIAGNOSIS — F0280 Dementia in other diseases classified elsewhere without behavioral disturbance: Secondary | ICD-10-CM | POA: Diagnosis not present

## 2016-12-25 DIAGNOSIS — F0150 Vascular dementia without behavioral disturbance: Secondary | ICD-10-CM | POA: Diagnosis not present

## 2016-12-25 DIAGNOSIS — E119 Type 2 diabetes mellitus without complications: Secondary | ICD-10-CM | POA: Diagnosis not present

## 2016-12-25 DIAGNOSIS — R5381 Other malaise: Secondary | ICD-10-CM | POA: Diagnosis not present

## 2016-12-25 DIAGNOSIS — R2681 Unsteadiness on feet: Secondary | ICD-10-CM | POA: Diagnosis not present

## 2016-12-25 DIAGNOSIS — G309 Alzheimer's disease, unspecified: Secondary | ICD-10-CM | POA: Diagnosis not present

## 2016-12-25 DIAGNOSIS — M419 Scoliosis, unspecified: Secondary | ICD-10-CM | POA: Diagnosis not present

## 2016-12-25 NOTE — Telephone Encounter (Signed)
[facility] and requested Verbal orders for PT 1x1wk and 2x8wks for strength, Balance and Gait. Verbal orders given.

## 2016-12-26 DIAGNOSIS — M6281 Muscle weakness (generalized): Secondary | ICD-10-CM | POA: Diagnosis not present

## 2016-12-26 DIAGNOSIS — R278 Other lack of coordination: Secondary | ICD-10-CM | POA: Diagnosis not present

## 2016-12-26 DIAGNOSIS — R399 Unspecified symptoms and signs involving the genitourinary system: Secondary | ICD-10-CM | POA: Diagnosis not present

## 2016-12-27 DIAGNOSIS — G309 Alzheimer's disease, unspecified: Secondary | ICD-10-CM | POA: Diagnosis not present

## 2016-12-27 DIAGNOSIS — M419 Scoliosis, unspecified: Secondary | ICD-10-CM | POA: Diagnosis not present

## 2016-12-27 DIAGNOSIS — Z7982 Long term (current) use of aspirin: Secondary | ICD-10-CM | POA: Diagnosis not present

## 2016-12-27 DIAGNOSIS — I10 Essential (primary) hypertension: Secondary | ICD-10-CM | POA: Diagnosis not present

## 2016-12-27 DIAGNOSIS — R5381 Other malaise: Secondary | ICD-10-CM | POA: Diagnosis not present

## 2016-12-27 DIAGNOSIS — R2681 Unsteadiness on feet: Secondary | ICD-10-CM | POA: Diagnosis not present

## 2016-12-27 DIAGNOSIS — E119 Type 2 diabetes mellitus without complications: Secondary | ICD-10-CM | POA: Diagnosis not present

## 2016-12-30 DIAGNOSIS — E119 Type 2 diabetes mellitus without complications: Secondary | ICD-10-CM | POA: Diagnosis not present

## 2016-12-30 DIAGNOSIS — I10 Essential (primary) hypertension: Secondary | ICD-10-CM | POA: Diagnosis not present

## 2016-12-30 DIAGNOSIS — Z7982 Long term (current) use of aspirin: Secondary | ICD-10-CM | POA: Diagnosis not present

## 2016-12-30 DIAGNOSIS — M419 Scoliosis, unspecified: Secondary | ICD-10-CM | POA: Diagnosis not present

## 2016-12-30 DIAGNOSIS — G309 Alzheimer's disease, unspecified: Secondary | ICD-10-CM | POA: Diagnosis not present

## 2016-12-30 DIAGNOSIS — R5381 Other malaise: Secondary | ICD-10-CM | POA: Diagnosis not present

## 2016-12-30 DIAGNOSIS — R2681 Unsteadiness on feet: Secondary | ICD-10-CM | POA: Diagnosis not present

## 2016-12-31 ENCOUNTER — Ambulatory Visit: Payer: Medicare Other | Admitting: Internal Medicine

## 2017-01-01 ENCOUNTER — Ambulatory Visit: Payer: Medicare Other | Admitting: Internal Medicine

## 2017-01-03 DIAGNOSIS — G309 Alzheimer's disease, unspecified: Secondary | ICD-10-CM | POA: Diagnosis not present

## 2017-01-03 DIAGNOSIS — Z7982 Long term (current) use of aspirin: Secondary | ICD-10-CM | POA: Diagnosis not present

## 2017-01-03 DIAGNOSIS — R2681 Unsteadiness on feet: Secondary | ICD-10-CM | POA: Diagnosis not present

## 2017-01-03 DIAGNOSIS — R5381 Other malaise: Secondary | ICD-10-CM | POA: Diagnosis not present

## 2017-01-03 DIAGNOSIS — I10 Essential (primary) hypertension: Secondary | ICD-10-CM | POA: Diagnosis not present

## 2017-01-03 DIAGNOSIS — M419 Scoliosis, unspecified: Secondary | ICD-10-CM | POA: Diagnosis not present

## 2017-01-03 DIAGNOSIS — E119 Type 2 diabetes mellitus without complications: Secondary | ICD-10-CM | POA: Diagnosis not present

## 2017-01-07 ENCOUNTER — Ambulatory Visit (INDEPENDENT_AMBULATORY_CARE_PROVIDER_SITE_OTHER): Payer: Medicare Other | Admitting: Internal Medicine

## 2017-01-07 ENCOUNTER — Encounter: Payer: Self-pay | Admitting: Internal Medicine

## 2017-01-07 ENCOUNTER — Telehealth: Payer: Self-pay

## 2017-01-07 VITALS — BP 138/64 | HR 53 | Temp 98.1°F | Ht 64.0 in

## 2017-01-07 DIAGNOSIS — Z79899 Other long term (current) drug therapy: Secondary | ICD-10-CM | POA: Diagnosis not present

## 2017-01-07 DIAGNOSIS — R2681 Unsteadiness on feet: Secondary | ICD-10-CM | POA: Diagnosis not present

## 2017-01-07 DIAGNOSIS — E785 Hyperlipidemia, unspecified: Secondary | ICD-10-CM

## 2017-01-07 DIAGNOSIS — F0150 Vascular dementia without behavioral disturbance: Secondary | ICD-10-CM

## 2017-01-07 DIAGNOSIS — R739 Hyperglycemia, unspecified: Secondary | ICD-10-CM | POA: Diagnosis not present

## 2017-01-07 DIAGNOSIS — E119 Type 2 diabetes mellitus without complications: Secondary | ICD-10-CM | POA: Diagnosis not present

## 2017-01-07 DIAGNOSIS — J181 Lobar pneumonia, unspecified organism: Secondary | ICD-10-CM | POA: Diagnosis not present

## 2017-01-07 DIAGNOSIS — I10 Essential (primary) hypertension: Secondary | ICD-10-CM | POA: Diagnosis not present

## 2017-01-07 DIAGNOSIS — G309 Alzheimer's disease, unspecified: Secondary | ICD-10-CM | POA: Diagnosis not present

## 2017-01-07 DIAGNOSIS — Z7982 Long term (current) use of aspirin: Secondary | ICD-10-CM | POA: Diagnosis not present

## 2017-01-07 DIAGNOSIS — M419 Scoliosis, unspecified: Secondary | ICD-10-CM | POA: Diagnosis not present

## 2017-01-07 DIAGNOSIS — F0280 Dementia in other diseases classified elsewhere without behavioral disturbance: Secondary | ICD-10-CM

## 2017-01-07 DIAGNOSIS — J189 Pneumonia, unspecified organism: Secondary | ICD-10-CM

## 2017-01-07 DIAGNOSIS — R5381 Other malaise: Secondary | ICD-10-CM

## 2017-01-07 LAB — CBC WITH DIFFERENTIAL/PLATELET
BASOS ABS: 71 {cells}/uL (ref 0–200)
Basophils Relative: 1 %
EOS PCT: 6 %
Eosinophils Absolute: 426 cells/uL (ref 15–500)
HCT: 37.6 % (ref 35.0–45.0)
HEMOGLOBIN: 12.3 g/dL (ref 11.7–15.5)
LYMPHS ABS: 2556 {cells}/uL (ref 850–3900)
LYMPHS PCT: 36 %
MCH: 30.4 pg (ref 27.0–33.0)
MCHC: 32.7 g/dL (ref 32.0–36.0)
MCV: 93.1 fL (ref 80.0–100.0)
MONOS PCT: 11 %
MPV: 9.8 fL (ref 7.5–12.5)
Monocytes Absolute: 781 cells/uL (ref 200–950)
NEUTROS PCT: 46 %
Neutro Abs: 3266 cells/uL (ref 1500–7800)
Platelets: 193 10*3/uL (ref 140–400)
RBC: 4.04 MIL/uL (ref 3.80–5.10)
RDW: 14 % (ref 11.0–15.0)
WBC: 7.1 10*3/uL (ref 3.8–10.8)

## 2017-01-07 NOTE — Telephone Encounter (Signed)
Called [facility] [phone] to have them fax med list and order sheet.

## 2017-01-07 NOTE — Patient Instructions (Signed)
Continue current medications as ordered  Will call with lab results  Follow up with specialists as scheduled  Push fluids  Follow up in 3 mos for HTN, dementia

## 2017-01-07 NOTE — Progress Notes (Signed)
Patient ID: Beverly Barnett, female   DOB: 07-12-1923, 83 y.o.   MRN: [id number]    Location:  [facility] Place of Service: OFFICE  Chief Complaint  Patient presents with  . Medical Management of Chronic Issues    2 week follow-up pneumonia, blood pressure, blood sugar. Here with daughter Amy.     HPI:  83 yo female seen today for f/u LLL CAP. She was seen on 12/17/16 for 3 week hx wet cough. Augmentin stopped and she was Rx 10d levaquin 527m, albuterol neb taper and hycodan for cough. She reports no f/c, cough. She completed levaquin. Level of care unchanged. She is attempting to assist with ADLs but she remains weak. Unable to transfer without significant assistance. [place] [facility] She is continent of bowel but incontinent of urine most days.  She is a poor historian due to dementia. Hx obtained from chart  Dementia - better on Aricept. MMSE score 16/30. She takes melatonin to help sleep. She takes vayacog daily. She is mostly w/c bound  HTN - stable on losartan and metoprolol.   Unsteady gait/left hemiparesis - unchanged. She gets HH PT. She cannot remember to do home exercises. She attends exercise daily  Hyperlipidemia - stable on colestipol. LDL 138; Total chol 227; TG 218; HDL 50  Rash - unchanged on triamcinolone cream and hydrocortisone cream  Constipation - stable on floraster  Past Medical History:  Diagnosis Date  . Cardiac catheterisation as the cause of abnormal reaction of patient, or of later complication 051/07/5850 . Dysphagia, unspecified(787.20)   . Encounter for long-term (current) use of other medications   . High cholesterol   . Hyperosmolality and/or hypernatremia   . Hypertension   . Osteoporosis   . Other atopic dermatitis and related conditions   . Type II or unspecified type diabetes mellitus without mention of complication, not stated as uncontrolled   . Vascular dementia 06/22/2013    Past Surgical History:  Procedure Laterality Date  . FEMUR IM NAIL   07/24/2011   Procedure: INTRAMEDULLARY (IM) NAIL FEMORAL;  Surgeon: JJohnn Hai MD;  Location: [facility];  Service: Orthopedics;  Laterality: Right;  Biomet, c-arm, Jackson table  . FEMUR SURGERY Right 03/2010  . FRACTURE SURGERY Left    Hip, Leg and shoulder  . INTRAMEDULLARY (IM) NAIL INTERTROCHANTERIC Left 07/30/2013   Procedure: INTRAMEDULLARY (IM) NAIL INTERTROCHANTRIC LEFT;  Surgeon: MMauri Pole MD;  Location: [facility];  Service: Orthopedics;  Laterality: Left;  . SHOULDER SURGERY Left 03/2010    Patient Care Team: CGildardo Cranker DO as PCP - General (Internal Medicine)  Social History   Social History  . Marital status: Widowed    Spouse name: N/A  . Number of children: N/A  . Years of education: N/A   Occupational History  . Not on file.   Social History Main Topics  . Smoking status: Never Smoker  . Smokeless tobacco: Never Used  . Alcohol use 0.6 oz/week    1 Glasses of wine per week     Comment: occasionally  . Drug use: No  . Sexual activity: Not Currently   Other Topics Concern  . Not on file   Social History Narrative  . No narrative on file     reports that she has never smoked. She has never used smokeless tobacco. She reports that she drinks about 0.6 oz of alcohol per week . She reports that she does not use drugs.  Family History  Problem Relation  Age of Onset  . Cancer Sister   . Heart disease Father    Family Status  Relation Status  . Sister Deceased  . Mother Deceased at age 67  . Father Deceased at age 56  . Sister Deceased  . Brother Deceased  . Daughter Alive  . Son Alive  . Brother Alive  . Son Alive     No Known Allergies  Medications: Patient's Medications  New Prescriptions   No medications on file  Previous Medications   ACETAMINOPHEN (TYLENOL) 500 MG TABLET    Take 1 tablet (500 mg total) by mouth 3 (three) times daily. For one week, then tid prn thereafter   ALBUTEROL (PROVENTIL) (2.5 MG/3ML) 0.083% NEBULIZER  SOLUTION    Use 2.34m (1 vial) via nebulizer TID x 7 days then BID x 7 days the daily x 7 days and stop for pneumonia   ASPIRIN EC 81 MG TABLET    Take 1 tablet (81 mg total) by mouth daily.   BENZONATATE (TESSALON) 100 MG CAPSULE    Take 100 mg by mouth 3 (three) times daily as needed for cough.   CETIRIZINE (ZYRTEC) 5 MG TABLET    Take 1 tablet (5 mg total) by mouth daily.   COENZYME Q10 (CO Q 10) 100 MG CAPS    Take 1 tablet by mouth 2 (two) times daily.   COLESTIPOL (COLESTID) 1 G TABLET    Take 1 tablet (1 g total) by mouth daily.   DIPHENOXYLATE-ATROPINE (LOMOTIL) 2.5-0.025 MG TABLET    Take one tablet by mouth three times daily as needed for diarrhea (control)   DONEPEZIL (ARICEPT) 10 MG TABLET    Take 10 mg by mouth at bedtime.   HYDROCODONE-HOMATROPINE (HYCODAN) 5-1.5 MG/5ML SYRUP    Take 5 mLs by mouth every 8 (eight) hours as needed for cough.   LOSARTAN (COZAAR) 100 MG TABLET    Take 100 mg by mouth daily.   MELATONIN 3 MG CAPS    Take 1 capsule (3 mg total) by mouth at bedtime and may repeat dose one time if needed.   METOPROLOL TARTRATE (LOPRESSOR) 25 MG TABLET    Take 25 mg by mouth 2 (two) times daily.   MULTIPLE VITAMIN (MULITIVITAMIN WITH MINERALS) TABS    Take 1 tablet by mouth daily.    SACCHAROMYCES BOULARDII (FLORASTOR) 250 MG CAPSULE    Take 250 mg by mouth 2 (two) times daily.   TRIAMCINOLONE ACETONIDE (TRIAMCINOLONE 0.1 % CREAM : EUCERIN) CREA    Apply 1 application topically daily as needed for rash (on face).   VAYACOG 100-19.5-6.5 MG CAPS    TAKE 1 CAPSULE EVERY DAY.  Modified Medications   No medications on file  Discontinued Medications   LEVOFLOXACIN (LEVAQUIN) 500 MG TABLET    Take 1 tablet (500 mg total) by mouth daily. For pneumonia    Review of Systems  Unable to perform ROS: Dementia    Vitals:   01/07/17 1050  BP: 138/64  Pulse: (!) 53  Temp: 98.1 F (36.7 C)  TempSrc: Oral  SpO2: 98%  Height: 5' 4" (1.626 m)   There is no height or weight on  file to calculate BMI.  Physical Exam  Constitutional: She appears well-developed.  Frail appearing in NAD  HENT:  MM dry; no oral thrush  Neck: Neck supple.  Cardiovascular: Normal rate, regular rhythm and intact distal pulses.  Exam reveals no gallop and no friction rub.   Murmur (1/6 SEM) heard. Pulmonary/Chest:  No respiratory distress. She has no wheezes. She has no rhonchi. She has no rales. She exhibits no tenderness.  Musculoskeletal: She exhibits edema. She exhibits no tenderness.  Lymphadenopathy:    She has no cervical adenopathy.  Neurological: She is alert.  Skin: Skin is warm and dry. No rash noted.  Psychiatric: She has a normal mood and affect. Her behavior is normal.     Labs reviewed: No visits with results within 3 Month(s) from this visit.  Latest known visit with results is:  Office Visit on 04/10/2016  Component Date Value Ref Range Status  . Sodium 04/10/2016 140  135 - 146 mmol/L Final  . Potassium 04/10/2016 4.6  3.5 - 5.3 mmol/L Final  . Chloride 04/10/2016 106  98 - 110 mmol/L Final  . CO2 04/10/2016 23  20 - 31 mmol/L Final  . Glucose, Bld 04/10/2016 104* 65 - 99 mg/dL Final  . BUN 04/10/2016 23  7 - 25 mg/dL Final  . Creat 04/10/2016 1.20* 0.60 - 0.88 mg/dL Final   Comment:   For patients > or = 81 years of age: The upper reference limit for Creatinine is approximately 13% higher for people identified as African-American.     . Calcium 04/10/2016 9.2  8.6 - 10.4 mg/dL Final  . GFR, Est African American 04/10/2016 46* >=60 mL/min Final  . GFR, Est Non African American 04/10/2016 40* >=60 mL/min Final  . Cholesterol 04/10/2016 227* 125 - 200 mg/dL Final  . Triglycerides 04/10/2016 218* <150 mg/dL Final  . HDL 04/10/2016 45* >=46 mg/dL Final  . Total CHOL/HDL Ratio 04/10/2016 5.0  <=5.0 Ratio Final  . VLDL 04/10/2016 44* <30 mg/dL Final  . LDL Cholesterol 04/10/2016 138* <130 mg/dL Final   Comment:   Total Cholesterol/HDL Ratio:CHD Risk                         Coronary Heart Disease Risk Table                                        Men       Women          1/2 Average Risk              3.4        3.3              Average Risk              5.0        4.4           2X Average Risk              9.6        7.1           3X Average Risk             23.4       11.0 Use the calculated Patient Ratio above and the CHD Risk table  to determine the patient's CHD Risk.   Marland Kitchen ALT 04/10/2016 13  6 - 29 U/L Final  . TSH 04/10/2016 1.84  mIU/L Final   Comment:   Reference Range   > or = 20 Years  0.40-4.50   Pregnancy Range First trimester  0.26-2.66 Second trimester 0.55-2.73 Third trimester  0.43-2.91       No results found.  Assessment/Plan   ICD-10-CM   1. Debility R53.81   2. Pneumonia of left lower lobe due to infectious organism [facility]) - resolved clinically J18.1   3. 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 Hyperlipidemia with target LDL less than 130 E78.5 Lipid Panel  5. Hyperglycemia R73.9 CMP with eGFR    Hemoglobin A1c  6. Essential hypertension, benign I10 CMP with eGFR    CBC with Differential/Platelets  7. High risk medication use Z79.899 CMP with eGFR    CBC with Differential/Platelets   Continue current medications as ordered  Will call with lab results  Follow up with specialists as scheduled  Push fluids  Follow up in 3 mos for HTN, dementia  Curtiss Mahmood S. Perlie Gold  [facility] and Adult Medicine [address] [place], [place] [address] [phone] Cell (Monday-Friday 8 AM - 5 PM) [phone] After 5 PM and follow prompts

## 2017-01-08 LAB — LIPID PANEL
CHOL/HDL RATIO: 5.5 ratio — AB (ref <5.0)
Cholesterol: 231 mg/dL — ABNORMAL HIGH (ref <200)
HDL: 42 mg/dL — ABNORMAL LOW (ref >50)
LDL CALC: 151 mg/dL — AB (ref <100)
Triglycerides: 192 mg/dL — ABNORMAL HIGH (ref <150)
VLDL: 38 mg/dL — AB (ref <30)

## 2017-01-08 LAB — COMPLETE METABOLIC PANEL WITH GFR
ALT: 10 U/L (ref 6–29)
AST: 16 U/L (ref 10–35)
Albumin: 3.6 g/dL (ref 3.6–5.1)
Alkaline Phosphatase: 65 U/L (ref 33–130)
BUN: 24 mg/dL (ref 7–25)
CHLORIDE: 107 mmol/L (ref 98–110)
CO2: 24 mmol/L (ref 20–31)
CREATININE: 0.97 mg/dL — AB (ref 0.60–0.88)
Calcium: 9.1 mg/dL (ref 8.6–10.4)
GFR, Est African American: 59 mL/min — ABNORMAL LOW (ref >=60)
GFR, Est Non African American: 52 mL/min — ABNORMAL LOW (ref >=60)
GLUCOSE: 82 mg/dL (ref 65–99)
Potassium: 5 mmol/L (ref 3.5–5.3)
SODIUM: 142 mmol/L (ref 135–146)
Total Bilirubin: 0.6 mg/dL (ref 0.2–1.2)
Total Protein: 6.3 g/dL (ref 6.1–8.1)

## 2017-01-08 LAB — HEMOGLOBIN A1C
HEMOGLOBIN A1C: 5.5 % (ref <5.7)
MEAN PLASMA GLUCOSE: 111 mg/dL

## 2017-01-09 ENCOUNTER — Other Ambulatory Visit: Payer: Self-pay

## 2017-01-09 DIAGNOSIS — R2681 Unsteadiness on feet: Secondary | ICD-10-CM | POA: Diagnosis not present

## 2017-01-09 DIAGNOSIS — R5381 Other malaise: Secondary | ICD-10-CM | POA: Diagnosis not present

## 2017-01-09 DIAGNOSIS — G309 Alzheimer's disease, unspecified: Secondary | ICD-10-CM | POA: Diagnosis not present

## 2017-01-09 DIAGNOSIS — R05 Cough: Secondary | ICD-10-CM

## 2017-01-09 DIAGNOSIS — I10 Essential (primary) hypertension: Secondary | ICD-10-CM | POA: Diagnosis not present

## 2017-01-09 DIAGNOSIS — E119 Type 2 diabetes mellitus without complications: Secondary | ICD-10-CM | POA: Diagnosis not present

## 2017-01-09 DIAGNOSIS — Z7982 Long term (current) use of aspirin: Secondary | ICD-10-CM | POA: Diagnosis not present

## 2017-01-09 DIAGNOSIS — R059 Cough, unspecified: Secondary | ICD-10-CM

## 2017-01-09 DIAGNOSIS — M419 Scoliosis, unspecified: Secondary | ICD-10-CM | POA: Diagnosis not present

## 2017-01-09 MED ORDER — HYDROCODONE-HOMATROPINE 5-1.5 MG/5ML PO SYRP: 5.0000 mL | ORAL_SOLUTION | Freq: Three times a day (TID) | ORAL | 0 refills | Status: DC | PRN

## 2017-01-09 NOTE — Telephone Encounter (Signed)
Request from [facility] phone [phone]

## 2017-01-13 DIAGNOSIS — R5381 Other malaise: Secondary | ICD-10-CM | POA: Diagnosis not present

## 2017-01-13 DIAGNOSIS — I10 Essential (primary) hypertension: Secondary | ICD-10-CM | POA: Diagnosis not present

## 2017-01-13 DIAGNOSIS — M419 Scoliosis, unspecified: Secondary | ICD-10-CM | POA: Diagnosis not present

## 2017-01-13 DIAGNOSIS — G309 Alzheimer's disease, unspecified: Secondary | ICD-10-CM | POA: Diagnosis not present

## 2017-01-13 DIAGNOSIS — R2681 Unsteadiness on feet: Secondary | ICD-10-CM | POA: Diagnosis not present

## 2017-01-13 DIAGNOSIS — Z7982 Long term (current) use of aspirin: Secondary | ICD-10-CM | POA: Diagnosis not present

## 2017-01-13 DIAGNOSIS — E119 Type 2 diabetes mellitus without complications: Secondary | ICD-10-CM | POA: Diagnosis not present

## 2017-01-15 DIAGNOSIS — G309 Alzheimer's disease, unspecified: Secondary | ICD-10-CM | POA: Diagnosis not present

## 2017-01-15 DIAGNOSIS — Z7982 Long term (current) use of aspirin: Secondary | ICD-10-CM | POA: Diagnosis not present

## 2017-01-15 DIAGNOSIS — R2681 Unsteadiness on feet: Secondary | ICD-10-CM | POA: Diagnosis not present

## 2017-01-15 DIAGNOSIS — R5381 Other malaise: Secondary | ICD-10-CM | POA: Diagnosis not present

## 2017-01-15 DIAGNOSIS — M419 Scoliosis, unspecified: Secondary | ICD-10-CM | POA: Diagnosis not present

## 2017-01-15 DIAGNOSIS — E119 Type 2 diabetes mellitus without complications: Secondary | ICD-10-CM | POA: Diagnosis not present

## 2017-01-15 DIAGNOSIS — I10 Essential (primary) hypertension: Secondary | ICD-10-CM | POA: Diagnosis not present

## 2017-01-20 DIAGNOSIS — E119 Type 2 diabetes mellitus without complications: Secondary | ICD-10-CM | POA: Diagnosis not present

## 2017-01-20 DIAGNOSIS — I10 Essential (primary) hypertension: Secondary | ICD-10-CM | POA: Diagnosis not present

## 2017-01-20 DIAGNOSIS — R5381 Other malaise: Secondary | ICD-10-CM | POA: Diagnosis not present

## 2017-01-20 DIAGNOSIS — R2681 Unsteadiness on feet: Secondary | ICD-10-CM | POA: Diagnosis not present

## 2017-01-20 DIAGNOSIS — G309 Alzheimer's disease, unspecified: Secondary | ICD-10-CM | POA: Diagnosis not present

## 2017-01-20 DIAGNOSIS — Z7982 Long term (current) use of aspirin: Secondary | ICD-10-CM | POA: Diagnosis not present

## 2017-01-20 DIAGNOSIS — M419 Scoliosis, unspecified: Secondary | ICD-10-CM | POA: Diagnosis not present

## 2017-01-22 ENCOUNTER — Emergency Department (HOSPITAL_COMMUNITY)
Admission: EM | Admit: 2017-01-22 | Discharge: 2017-01-23 | Disposition: A | Discharge status: Home / Self Care | Payer: Medicare Other | Attending: Emergency Medicine | Admitting: Emergency Medicine

## 2017-01-22 ENCOUNTER — Encounter (HOSPITAL_COMMUNITY): Payer: Self-pay

## 2017-01-22 DIAGNOSIS — R2681 Unsteadiness on feet: Secondary | ICD-10-CM | POA: Diagnosis not present

## 2017-01-22 DIAGNOSIS — I10 Essential (primary) hypertension: Secondary | ICD-10-CM | POA: Diagnosis not present

## 2017-01-22 DIAGNOSIS — Z79899 Other long term (current) drug therapy: Secondary | ICD-10-CM | POA: Diagnosis not present

## 2017-01-22 DIAGNOSIS — G309 Alzheimer's disease, unspecified: Secondary | ICD-10-CM | POA: Diagnosis not present

## 2017-01-22 DIAGNOSIS — Z7982 Long term (current) use of aspirin: Secondary | ICD-10-CM | POA: Insufficient documentation

## 2017-01-22 DIAGNOSIS — N939 Abnormal uterine and vaginal bleeding, unspecified: Secondary | ICD-10-CM | POA: Insufficient documentation

## 2017-01-22 DIAGNOSIS — E119 Type 2 diabetes mellitus without complications: Secondary | ICD-10-CM | POA: Diagnosis not present

## 2017-01-22 DIAGNOSIS — N390 Urinary tract infection, site not specified: Secondary | ICD-10-CM | POA: Insufficient documentation

## 2017-01-22 DIAGNOSIS — R5381 Other malaise: Secondary | ICD-10-CM | POA: Diagnosis not present

## 2017-01-22 DIAGNOSIS — M419 Scoliosis, unspecified: Secondary | ICD-10-CM | POA: Diagnosis not present

## 2017-01-22 DIAGNOSIS — R109 Unspecified abdominal pain: Secondary | ICD-10-CM | POA: Diagnosis not present

## 2017-01-22 NOTE — ED Notes (Signed)
Bed: [id number] Expected date:  Expected time:  Means of arrival:  Comments: [id number] vag bleed abdominal pain

## 2017-01-22 NOTE — ED Provider Notes (Signed)
[facility]-EMERGENCY DEPT Provider Note   CSN: [id number] Arrival date & time: 01/22/17  2323  By signing my name below, I, Deland Pretty, attest that this documentation has been prepared under the direction and in the presence of Derwood Kaplan, MD. Electronically Signed: Deland Pretty, ED Scribe. 01/23/17. 12:17 AM.  History   Chief Complaint Chief Complaint  Patient presents with  . Abdominal Pain   The history is provided by the patient and a relative. No language interpreter was used.   HPI Comments: Beverly Barnett is a 83 y.o. female BIB EMS, with a h/x of dementia, who presents to the Emergency Department complaining of sudden onset of left-sided abdominal pain with associated severe vaginal bleeding that began 3 hours ago yesterday. Per daughter, the pt has had similar symptoms in the past that occurred approximately a year ago. Daughter states that the pt has not had any rectal bleeding. She lives in [place] nursing home at [facility]. The pt has a PMHx of HTN, DM, osteoporosis, and HLD. She denies a h/x of pelvic and/or abdominal surgeries. She also denies fatigue, SOB, chest pain and dizziness.  Past Medical History:  Diagnosis Date  . Cardiac catheterisation as the cause of abnormal reaction of patient, or of later complication 10/05/2006  . Dysphagia, unspecified(787.20)   . Encounter for long-term (current) use of other medications   . High cholesterol   . Hyperosmolality and/or hypernatremia   . Hypertension   . Osteoporosis   . Other atopic dermatitis and related conditions   . Type II or unspecified type diabetes mellitus without mention of complication, not stated as uncontrolled   . Vascular dementia 06/22/2013    Patient Active Problem List   Diagnosis Date Noted  . Pneumonia of left lower lobe due to infectious organism (HCC) 12/17/2016  . Cough 12/17/2016  . Seasonal allergic rhinitis 07/19/2016  . Left hemiparesis (HCC) 12/25/2015  . Abnormal brain MRI  12/25/2015  . Hydrocephalus ex vacuo 12/25/2015  . Essential hypertension, benign 11/29/2015  . Psoriasis 11/29/2015  . 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 Essential hypertension 04/09/2015  . Hip pain 04/09/2015  . Unsteady gait 04/09/2015  . Right hip pain 12/09/2014  . Frequent falls 12/09/2014  . UTI (urinary tract infection) 09/06/2014  . 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 Insomnia 01/11/2014  . Dementia, vascular, mixed 10/13/2013  . Left leg weakness 10/13/2013  . Intertrochanteric fracture of left hip (HCC) 07/29/2013  . Anemia 07/29/2013  . Hip fracture, left (HCC) 07/29/2013  . Vascular dementia 06/22/2013  . Hyperlipidemia with target LDL less than 130 06/22/2013  . Other and unspecified hyperlipidemia 02/16/2013  . Osteoporosis 01/05/2013  . Dementia 01/05/2013  . Hypertension   . High cholesterol     Past Surgical History:  Procedure Laterality Date  . FEMUR IM NAIL  07/24/2011   Procedure: INTRAMEDULLARY (IM) NAIL FEMORAL;  Surgeon: Javier Docker, MD;  Location: [facility];  Service: Orthopedics;  Laterality: Right;  Biomet, c-arm, Jackson table  . FEMUR SURGERY Right 03/2010  . FRACTURE SURGERY Left    Hip, Leg and shoulder  . INTRAMEDULLARY (IM) NAIL INTERTROCHANTERIC Left 07/30/2013   Procedure: INTRAMEDULLARY (IM) NAIL INTERTROCHANTRIC LEFT;  Surgeon: Shelda Pal, MD;  Location: [facility];  Service: Orthopedics;  Laterality: Left;  . SHOULDER SURGERY Left 03/2010    OB History    No data available       Home Medications    Prior to Admission medications  Medication Sig Start Date End Date Taking? Authorizing Provider  acetaminophen (TYLENOL) 500 MG tablet Take 1 tablet (500 mg total) by mouth 3 (three) times daily. For one week, then tid prn thereafter 06/19/15  Yes Reed, Tiffany L, DO  aspirin EC 81 MG tablet Take 1 tablet (81 mg total) by mouth daily. 12/25/15  Yes Micki Riley, MD  cetirizine (ZYRTEC) 5 MG tablet Take 1 tablet (5 mg  total) by mouth daily. 04/10/16  Yes Montez Morita, Monica, DO  Coenzyme Q10 (CO Q 10) 100 MG CAPS Take 1 tablet by mouth 2 (two) times daily.   Yes [provider]  colestipol (COLESTID) 1 G tablet Take 1 tablet (1 g total) by mouth daily. 10/07/14  Yes Reed, Tiffany L, DO  diphenoxylate-atropine (LOMOTIL) 2.5-0.025 MG tablet Take one tablet by mouth three times daily as needed for diarrhea (control) 04/15/16  Yes Reed, Tiffany L, DO  donepezil (ARICEPT) 10 MG tablet Take 10 mg by mouth at bedtime.   Yes [provider]  HYDROcodone-homatropine (HYCODAN) 5-1.5 MG/5ML syrup Take 5 mLs by mouth every 8 (eight) hours as needed for cough. 01/09/17  Yes Reed, Tiffany L, DO  losartan (COZAAR) 100 MG tablet Take 100 mg by mouth daily.   Yes [provider]  Melatonin 3 MG CAPS Take 1 capsule (3 mg total) by mouth at bedtime and may repeat dose one time if needed. 10/07/14  Yes Reed, Tiffany L, DO  metoprolol tartrate (LOPRESSOR) 25 MG tablet Take 25 mg by mouth 2 (two) times daily.   Yes [provider]  Multiple Vitamin (MULITIVITAMIN WITH MINERALS) TABS Take 1 tablet by mouth daily.    Yes [provider]  saccharomyces boulardii (FLORASTOR) 250 MG capsule Take 250 mg by mouth 2 (two) times daily.   Yes [provider]  Triamcinolone Acetonide (TRIAMCINOLONE 0.1 % CREAM : EUCERIN) CREA Apply 1 application topically daily as needed for rash (on face).   Yes [provider]  VAYACOG 100-19.5-6.5 MG CAPS TAKE 1 CAPSULE EVERY DAY. 10/10/16  Yes [name], DO  albuterol (PROVENTIL) (2.5 MG/3ML) 0.083% nebulizer solution Use 2.5mg  (1 vial) via nebulizer TID x 7 days then BID x 7 days the daily x 7 days and stop for pneumonia Patient not taking: Reported on 01/22/2017 12/17/16   [name], DO  cephALEXin (KEFLEX) 500 MG capsule Take 1 capsule (500 mg total) by mouth 2 (two) times daily. 01/23/17   Derwood Kaplan, MD    Family History Family History    Problem Relation Age of Onset  . Cancer Sister   . Heart disease Father     Social History Social History  Substance Use Topics  . Smoking status: Never Smoker  . Smokeless tobacco: Never Used  . Alcohol use 0.6 oz/week    1 Glasses of wine per week     Comment: occasionally     Allergies   Patient has no known allergies.   Review of Systems Review of Systems  Constitutional: Negative for fatigue.  Respiratory: Negative for shortness of breath.   Cardiovascular: Negative for chest pain.  Gastrointestinal: Positive for abdominal pain.       No melena, no hematochezia.  Genitourinary: Positive for vaginal bleeding.  Neurological: Negative for dizziness.  Hematological: Bruises/bleeds easily.     Physical Exam Updated Vital Signs BP (!) 143/56   Pulse (!) 57   Temp 98.1 F (36.7 C) (Oral)   Resp 15   Ht 5' (1.524 m)  Wt 61.2 kg (135 lb)   SpO2 96%   BMI 26.37 kg/m   Physical Exam  Constitutional: She is oriented to person, place, and time. She appears well-developed and well-nourished.  HENT:  Head: Normocephalic.  Eyes: EOM are normal.  Neck: Normal range of motion.  Cardiovascular: Normal rate, regular rhythm, normal heart sounds and intact distal pulses.  Exam reveals no gallop and no friction rub.   No murmur heard. Pulmonary/Chest: Effort normal. No respiratory distress. She has no wheezes. She has no rales.  Abdominal: Soft. Bowel sounds are normal. She exhibits no distension. There is tenderness. There is no rebound and no guarding.  Generalized abdominal tenderness localized to the left upper and lower quadrants.  DRE revealed no gross blood  Genitourinary:  Genitourinary Comments: External exam reveals dry blood in the vulvar region. Speculum exam shows no source however, and bimanual exam was neg as well.  Musculoskeletal: Normal range of motion.  Neurological: She is alert and oriented to person, place, and time.  Psychiatric: She has a  normal mood and affect.  Nursing note and vitals reviewed.    ED Treatments / Results   DIAGNOSTIC STUDIES:  COORDINATION OF CARE: 11:56 PM-Discussed next steps with pt. Pt verbalized understanding and is agreeable with the plan.   Labs (all labs ordered are listed, but only abnormal results are displayed) Labs Reviewed  LIPASE, BLOOD - Abnormal; Notable for the following:       Result Value   Lipase 57 (*)    All other components within normal limits  COMPREHENSIVE METABOLIC PANEL - Abnormal; Notable for the following:    Glucose, Bld 106 (*)    BUN 28 (*)    Creatinine, Ser 1.07 (*)    ALT 13 (*)    GFR calc non Af Amer 44 (*)    GFR calc Af Amer 51 (*)    All other components within normal limits  URINALYSIS, ROUTINE W REFLEX MICROSCOPIC - Abnormal; Notable for the following:    Color, Urine STRAW (*)    Hgb urine dipstick LARGE (*)    Leukocytes, UA MODERATE (*)    Bacteria, UA MANY (*)    Squamous Epithelial / LPF 0-5 (*)    All other components within normal limits  POC OCCULT BLOOD, ED - Abnormal; Notable for the following:    Fecal Occult Bld POSITIVE (*)    All other components within normal limits  CBC  PROTIME-INR    EKG  EKG Interpretation None       Radiology US Transvaginal Non-ob  Result Date: 01/23/2017 CLINICAL DATA:  Acute onset of postmenopausal vaginal bleeding. Initial encounter. EXAM: TRANSABDOMINAL AND TRANSVAGINAL ULTRASOUND OF PELVIS TECHNIQUE: Both transabdominal and transvaginal ultrasound examinations of the pelvis were performed. Transabdominal technique was performed for global imaging of the pelvis including uterus, ovaries, adnexal regions, and pelvic cul-de-sac. It was necessary to proceed with endovaginal exam following the transabdominal exam to visualize the endometrium. COMPARISON:  None FINDINGS: Uterus Measurements: 3.6 x 1.9 x 2.7 cm. No fibroids or other mass visualized. Endometrium Not well characterized. Keelin fluid is  noted at the endometrial canal. Right ovary Not visualized. Left ovary Not visualized. Other findings No free fluid is seen within the pelvic cul-de-sac. IMPRESSION: 1. Uterus diminutive and grossly unremarkable in appearance. Quraishi fluid noted at the endometrial canal. Endometrial echo complex not well seen. If the patient's vaginal bleeding persists, further evaluation would be recommended to exclude underlying mass. 2. Ovaries not visualized. Electronically  Signed   By: Roanna Raider M.D.   On: 01/23/2017 02:59   US Pelvis Complete  Result Date: 01/23/2017 CLINICAL DATA:  Acute onset of postmenopausal vaginal bleeding. Initial encounter. EXAM: TRANSABDOMINAL AND TRANSVAGINAL ULTRASOUND OF PELVIS TECHNIQUE: Both transabdominal and transvaginal ultrasound examinations of the pelvis were performed. Transabdominal technique was performed for global imaging of the pelvis including uterus, ovaries, adnexal regions, and pelvic cul-de-sac. It was necessary to proceed with endovaginal exam following the transabdominal exam to visualize the endometrium. COMPARISON:  None FINDINGS: Uterus Measurements: 3.6 x 1.9 x 2.7 cm. No fibroids or other mass visualized. Endometrium Not well characterized. Pariseau fluid is noted at the endometrial canal. Right ovary Not visualized. Left ovary Not visualized. Other findings No free fluid is seen within the pelvic cul-de-sac. IMPRESSION: 1. Uterus diminutive and grossly unremarkable in appearance. Stroh fluid noted at the endometrial canal. Endometrial echo complex not well seen. If the patient's vaginal bleeding persists, further evaluation would be recommended to exclude underlying mass. 2. Ovaries not visualized. Electronically Signed   By: Roanna Raider M.D.   On: 01/23/2017 02:59    Procedures Procedures (including critical care time)  Medications Ordered in ED Medications  cefTRIAXone (ROCEPHIN) 1 g in dextrose 5 % 50 mL IVPB (0 g Intravenous Stopped 01/23/17 0308)      Initial Impression / Assessment and Plan / ED Course  I have reviewed the triage vital signs and the nursing notes.  Pertinent labs & imaging results that were available during my care of the patient were reviewed by me and considered in my medical decision making (see chart for details).    Pt comes in with cc of bleeding.  It is unclear if the bleeding source is rectal or vaginal. Pt has no hx of bleed from either source - although daughter reports that pt might have had bleed last year, likely rectal. On exam, it seems like the source if GU - but manual exam is not convincing for rectal or pelvic source. [place] ordered, no clear etiology seen. Pt hasnt had a repeat episode here. UA shows likely inflammatory changes - could be cystitis related.  Results from the ER workup discussed with the patient face to face and all questions answered to the best of my ability.  Plan is to treat w/ keflex and have pcp see the patient if vaginal source persists. Pt to return to the ER if symptoms worsen.  Patient is nontoxic, nonseptic appearing, in no apparent distress.  Patient's pain and other symptoms adequately managed in emergency department.  Fluid bolus given.  Labs, imaging and vitals reviewed.  Patient does not meet the SIRS or Sepsis criteria.  On repeat exam patient does not have a surgical abdomin and there are no peritoneal signs.  No indication of appendicitis, bowel obstruction, bowel perforation, cholecystitis, diverticulitis, PID.  Patient discharged home with symptomatic treatment and given strict instructions for follow-up with their primary care physician.  I have also discussed reasons to return immediately to the ER.  Patient expresses understanding and agrees with plan.  Final Clinical Impressions(s) / ED Diagnoses   Final diagnoses:  Lower urinary tract infectious disease  Vaginal bleeding    New Prescriptions Current Discharge Medication List    START taking these  medications   Details  cephALEXin (KEFLEX) 500 MG capsule Take 1 capsule (500 mg total) by mouth 2 (two) times daily. Qty: 14 capsule, Refills: 0       I personally performed the services described  in this documentation, which was scribed in my presence. The recorded information has been reviewed and is accurate.    Derwood KaplanNanavati, Jaicee Michelotti, MD 01/23/17 [phone]

## 2017-01-22 NOTE — ED Triage Notes (Signed)
From [place] at [facility] states about 2 hours ago with abdominal pain and vaginal bleeding with bright red bleeding noted pt has dementia.

## 2017-01-23 ENCOUNTER — Emergency Department (HOSPITAL_COMMUNITY): Payer: Medicare Other

## 2017-01-23 DIAGNOSIS — N390 Urinary tract infection, site not specified: Secondary | ICD-10-CM | POA: Diagnosis not present

## 2017-01-23 LAB — URINALYSIS, ROUTINE W REFLEX MICROSCOPIC
Bilirubin Urine: NEGATIVE
Glucose, UA: NEGATIVE mg/dL
Ketones, ur: NEGATIVE mg/dL
NITRITE: NEGATIVE
Protein, ur: NEGATIVE mg/dL
SPECIFIC GRAVITY, URINE: 1.009 (ref 1.005–1.030)
pH: 6 (ref 5.0–8.0)

## 2017-01-23 LAB — COMPREHENSIVE METABOLIC PANEL
ALBUMIN: 3.7 g/dL (ref 3.5–5.0)
ALK PHOS: 62 U/L (ref 38–126)
ALT: 13 U/L — AB (ref 14–54)
AST: 23 U/L (ref 15–41)
Anion gap: 8 (ref 5–15)
BILIRUBIN TOTAL: 0.4 mg/dL (ref 0.3–1.2)
BUN: 28 mg/dL — AB (ref 6–20)
CALCIUM: 9 mg/dL (ref 8.9–10.3)
CO2: 24 mmol/L (ref 22–32)
CREATININE: 1.07 mg/dL — AB (ref 0.44–1.00)
Chloride: 108 mmol/L (ref 101–111)
GFR calc Af Amer: 51 mL/min — ABNORMAL LOW (ref >60)
GFR calc non Af Amer: 44 mL/min — ABNORMAL LOW (ref >60)
GLUCOSE: 106 mg/dL — AB (ref 65–99)
Potassium: 4.8 mmol/L (ref 3.5–5.1)
Sodium: 140 mmol/L (ref 135–145)
TOTAL PROTEIN: 6.9 g/dL (ref 6.5–8.1)

## 2017-01-23 LAB — CBC
HEMATOCRIT: 37.7 % (ref 36.0–46.0)
Hemoglobin: 12.6 g/dL (ref 12.0–15.0)
MCH: 30.4 pg (ref 26.0–34.0)
MCHC: 33.4 g/dL (ref 30.0–36.0)
MCV: 90.8 fL (ref 78.0–100.0)
Platelets: 191 10*3/uL (ref 150–400)
RBC: 4.15 MIL/uL (ref 3.87–5.11)
RDW: 13.5 % (ref 11.5–15.5)
WBC: 9 10*3/uL (ref 4.0–10.5)

## 2017-01-23 LAB — PROTIME-INR
INR: 0.91
Prothrombin Time: 12.2 seconds (ref 11.4–15.2)

## 2017-01-23 LAB — POC OCCULT BLOOD, ED: FECAL OCCULT BLD: POSITIVE — AB

## 2017-01-23 LAB — LIPASE, BLOOD: Lipase: 57 U/L — ABNORMAL HIGH (ref 11–51)

## 2017-01-23 MED ORDER — CEPHALEXIN 500 MG PO CAPS: 500.0000 mg | ORAL_CAPSULE | Freq: Four times a day (QID) | ORAL | 0 refills | Status: DC

## 2017-01-23 MED ORDER — CEPHALEXIN 500 MG PO CAPS: 500.0000 mg | ORAL_CAPSULE | Freq: Two times a day (BID) | ORAL | 0 refills | Status: DC

## 2017-01-23 MED ORDER — DEXTROSE 5 % IV SOLN
1.0000 g | Freq: Once | INTRAVENOUS | Status: AC
  Administered 2017-01-23: 1 g via INTRAVENOUS
  Filled 2017-01-23: qty 10

## 2017-01-23 NOTE — Discharge Instructions (Signed)
The ultrasound doesn't show any definitve abnormality. We think currently that the blood source is from the genitourinary canal, and that the most likely culprit is bladder infection. Start the antibiotics. If the bleeding persists beyond 3 days, please have the primary care doctor evaluate this matter further.   If the bleeding gets worse or if there is rectal bleeding, please return to the ER.

## 2017-01-26 DIAGNOSIS — R399 Unspecified symptoms and signs involving the genitourinary system: Secondary | ICD-10-CM | POA: Diagnosis not present

## 2017-01-26 DIAGNOSIS — R278 Other lack of coordination: Secondary | ICD-10-CM | POA: Diagnosis not present

## 2017-01-26 DIAGNOSIS — M6281 Muscle weakness (generalized): Secondary | ICD-10-CM | POA: Diagnosis not present

## 2017-01-28 DIAGNOSIS — E119 Type 2 diabetes mellitus without complications: Secondary | ICD-10-CM | POA: Diagnosis not present

## 2017-01-28 DIAGNOSIS — Z7982 Long term (current) use of aspirin: Secondary | ICD-10-CM | POA: Diagnosis not present

## 2017-01-28 DIAGNOSIS — M419 Scoliosis, unspecified: Secondary | ICD-10-CM | POA: Diagnosis not present

## 2017-01-28 DIAGNOSIS — R5381 Other malaise: Secondary | ICD-10-CM | POA: Diagnosis not present

## 2017-01-28 DIAGNOSIS — R2681 Unsteadiness on feet: Secondary | ICD-10-CM | POA: Diagnosis not present

## 2017-01-28 DIAGNOSIS — G309 Alzheimer's disease, unspecified: Secondary | ICD-10-CM | POA: Diagnosis not present

## 2017-01-28 DIAGNOSIS — I10 Essential (primary) hypertension: Secondary | ICD-10-CM | POA: Diagnosis not present

## 2017-01-30 ENCOUNTER — Telehealth: Payer: Self-pay

## 2017-01-30 DIAGNOSIS — I10 Essential (primary) hypertension: Secondary | ICD-10-CM | POA: Diagnosis not present

## 2017-01-30 DIAGNOSIS — G309 Alzheimer's disease, unspecified: Secondary | ICD-10-CM | POA: Diagnosis not present

## 2017-01-30 DIAGNOSIS — Z7982 Long term (current) use of aspirin: Secondary | ICD-10-CM | POA: Diagnosis not present

## 2017-01-30 DIAGNOSIS — R5381 Other malaise: Secondary | ICD-10-CM | POA: Diagnosis not present

## 2017-01-30 DIAGNOSIS — M419 Scoliosis, unspecified: Secondary | ICD-10-CM | POA: Diagnosis not present

## 2017-01-30 DIAGNOSIS — E119 Type 2 diabetes mellitus without complications: Secondary | ICD-10-CM | POA: Diagnosis not present

## 2017-01-30 DIAGNOSIS — R2681 Unsteadiness on feet: Secondary | ICD-10-CM | POA: Diagnosis not present

## 2017-01-30 NOTE — Telephone Encounter (Signed)
Daughter called, she had received a call from [place], mother has diarrhea for two days. Amy had taken her to the ED on 01/23/17 for UTI, they gave her Keflex for 7 days. Took the last one today. I told the daughter that antibiotics can cause diarrhea. We could make her an appt for today to be seen. The daughter said it's very hard to get her out. We could wait until tomorrow since she just took the last Keflex today and if she still has diarrhea we would see her tomorrow. She was find with this suggestion.  She is already on Florastor.

## 2017-01-31 ENCOUNTER — Emergency Department (HOSPITAL_COMMUNITY)
Admission: EM | Admit: 2017-01-31 | Discharge: 2017-01-31 | Disposition: A | Discharge status: Skilled Nursing Facility | Payer: Medicare Other | Attending: Emergency Medicine | Admitting: Emergency Medicine

## 2017-01-31 ENCOUNTER — Encounter (HOSPITAL_COMMUNITY): Payer: Self-pay | Admitting: Emergency Medicine

## 2017-01-31 DIAGNOSIS — R3989 Other symptoms and signs involving the genitourinary system: Secondary | ICD-10-CM | POA: Diagnosis not present

## 2017-01-31 DIAGNOSIS — N368 Other specified disorders of urethra: Secondary | ICD-10-CM

## 2017-01-31 DIAGNOSIS — R58 Hemorrhage, not elsewhere classified: Secondary | ICD-10-CM | POA: Diagnosis not present

## 2017-01-31 DIAGNOSIS — Z79899 Other long term (current) drug therapy: Secondary | ICD-10-CM | POA: Insufficient documentation

## 2017-01-31 DIAGNOSIS — E119 Type 2 diabetes mellitus without complications: Secondary | ICD-10-CM | POA: Insufficient documentation

## 2017-01-31 DIAGNOSIS — I10 Essential (primary) hypertension: Secondary | ICD-10-CM | POA: Insufficient documentation

## 2017-01-31 DIAGNOSIS — F0390 Unspecified dementia without behavioral disturbance: Secondary | ICD-10-CM | POA: Insufficient documentation

## 2017-01-31 DIAGNOSIS — K625 Hemorrhage of anus and rectum: Secondary | ICD-10-CM | POA: Diagnosis not present

## 2017-01-31 DIAGNOSIS — N938 Other specified abnormal uterine and vaginal bleeding: Secondary | ICD-10-CM | POA: Diagnosis present

## 2017-01-31 DIAGNOSIS — Z7982 Long term (current) use of aspirin: Secondary | ICD-10-CM | POA: Insufficient documentation

## 2017-01-31 LAB — CBC WITH DIFFERENTIAL/PLATELET
Basophils Absolute: 0 10*3/uL (ref 0.0–0.1)
Basophils Relative: 0 %
Eosinophils Absolute: 0.4 10*3/uL (ref 0.0–0.7)
Eosinophils Relative: 3 %
HCT: 35.4 % — ABNORMAL LOW (ref 36.0–46.0)
Hemoglobin: 11.8 g/dL — ABNORMAL LOW (ref 12.0–15.0)
Lymphocytes Relative: 20 %
Lymphs Abs: 2.8 10*3/uL (ref 0.7–4.0)
MCH: 30.5 pg (ref 26.0–34.0)
MCHC: 33.3 g/dL (ref 30.0–36.0)
MCV: 91.5 fL (ref 78.0–100.0)
Monocytes Absolute: 1.4 10*3/uL — ABNORMAL HIGH (ref 0.1–1.0)
Monocytes Relative: 11 %
Neutro Abs: 9 10*3/uL — ABNORMAL HIGH (ref 1.7–7.7)
Neutrophils Relative %: 66 %
Platelets: 178 10*3/uL (ref 150–400)
RBC: 3.87 MIL/uL (ref 3.87–5.11)
RDW: 13.8 % (ref 11.5–15.5)
WBC: 13.7 10*3/uL — ABNORMAL HIGH (ref 4.0–10.5)

## 2017-01-31 LAB — URINALYSIS, ROUTINE W REFLEX MICROSCOPIC
BILIRUBIN URINE: NEGATIVE
Bacteria, UA: NONE SEEN
GLUCOSE, UA: NEGATIVE mg/dL
HGB URINE DIPSTICK: NEGATIVE
Ketones, ur: NEGATIVE mg/dL
Leukocytes, UA: NEGATIVE
NITRITE: NEGATIVE
Protein, ur: NEGATIVE mg/dL
SPECIFIC GRAVITY, URINE: 1.005 (ref 1.005–1.030)
Squamous Epithelial / LPF: NONE SEEN
pH: 6 (ref 5.0–8.0)

## 2017-01-31 LAB — BASIC METABOLIC PANEL
Anion gap: 7 (ref 5–15)
BUN: 30 mg/dL — ABNORMAL HIGH (ref 6–20)
CO2: 25 mmol/L (ref 22–32)
Calcium: 8.6 mg/dL — ABNORMAL LOW (ref 8.9–10.3)
Chloride: 108 mmol/L (ref 101–111)
Creatinine, Ser: 1.2 mg/dL — ABNORMAL HIGH (ref 0.44–1.00)
GFR calc Af Amer: 45 mL/min — ABNORMAL LOW (ref >60)
GFR calc non Af Amer: 39 mL/min — ABNORMAL LOW (ref >60)
Glucose, Bld: 102 mg/dL — ABNORMAL HIGH (ref 65–99)
Potassium: 3.8 mmol/L (ref 3.5–5.1)
Sodium: 140 mmol/L (ref 135–145)

## 2017-01-31 LAB — POC OCCULT BLOOD, ED: Fecal Occult Bld: NEGATIVE

## 2017-01-31 NOTE — ED Triage Notes (Signed)
Brought in by EMS from [place] at [facility] facility with c/o rectal/vaginal bleeding.  Pt was assisted to the bathroom--- observed by staff "presence of blood in the commode--- unsure if from rectum or vagina".  Pt recently completed 7-day course of antibitotics for her UTI.  Pt does complain "pain in rectum".

## 2017-01-31 NOTE — Discharge Instructions (Addendum)
Please follow-up with her primary care provider for reevaluation next week. If any new or worsening signs or symptoms present please return to the emergency room for repeat evaluation. Please continue moisture barrier cream that was given at the time of discharge as needed for skin irritation.

## 2017-01-31 NOTE — ED Notes (Signed)
[facility] was called for pt's transportation back to [place] at [facility].

## 2017-01-31 NOTE — ED Provider Notes (Signed)
[facility]-EMERGENCY DEPT Provider Note   CSN: [id number] Arrival date & time: 01/31/17  1933     History   Chief Complaint Chief Complaint  Patient presents with  . Rectal/Vaginal Bleeding    HPI Beverly Barnett is a 83 y.o. female.  HPI  Level V caveat due to dementia  83 year old female presents from [place] at [facility] at the request of nursing staff for vaginal bleeding. I spoke with nursing staff reports that after using the restroom she had vaginal bleeding. Patient was seen here on16 2018 for similar presentation. She was noted to have a urinary tract infection was placed on antibiotics. Patient had an ultrasound of her pelvis with no significant findings.  I spoke with the patient's daughter on the phone who reports she's been in her usual state of health. She does have decreased by mouth intake as she does not want to drink water as she has difficulty getting to the restroom. Patient's daughter is uncertain why she continues to be sent to the emergency room as this has happened several times and was thinking this was related to urinary tract infection.   Patient had some diarrhea after her antibiotics for the urinary tract infection, but nursing staff reports this is improving.  Past Medical History:  Diagnosis Date  . Cardiac catheterisation as the cause of abnormal reaction of patient, or of later complication 10/05/2006  . Dysphagia, unspecified(787.20)   . Encounter for long-term (current) use of other medications   . High cholesterol   . Hyperosmolality and/or hypernatremia   . Hypertension   . Osteoporosis   . Other atopic dermatitis and related conditions   . Type II or unspecified type diabetes mellitus without mention of complication, not stated as uncontrolled   . Vascular dementia 06/22/2013    Patient Active Problem List   Diagnosis Date Noted  . Pneumonia of left lower lobe due to infectious organism (HCC) 12/17/2016  . Cough 12/17/2016  . Seasonal  allergic rhinitis 07/19/2016  . Left hemiparesis (HCC) 12/25/2015  . Abnormal brain MRI 12/25/2015  . Hydrocephalus ex vacuo 12/25/2015  . Essential hypertension, benign 11/29/2015  . Psoriasis 11/29/2015  . 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 Essential hypertension 04/09/2015  . Hip pain 04/09/2015  . Unsteady gait 04/09/2015  . Right hip pain 12/09/2014  . Frequent falls 12/09/2014  . UTI (urinary tract infection) 09/06/2014  . 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 Insomnia 01/11/2014  . Dementia, vascular, mixed 10/13/2013  . Left leg weakness 10/13/2013  . Intertrochanteric fracture of left hip (HCC) 07/29/2013  . Anemia 07/29/2013  . Hip fracture, left (HCC) 07/29/2013  . Vascular dementia 06/22/2013  . Hyperlipidemia with target LDL less than 130 06/22/2013  . Other and unspecified hyperlipidemia 02/16/2013  . Osteoporosis 01/05/2013  . Dementia 01/05/2013  . Hypertension   . High cholesterol     Past Surgical History:  Procedure Laterality Date  . FEMUR IM NAIL  07/24/2011   Procedure: INTRAMEDULLARY (IM) NAIL FEMORAL;  Surgeon: Javier Docker, MD;  Location: [facility];  Service: Orthopedics;  Laterality: Right;  Biomet, c-arm, Jackson table  . FEMUR SURGERY Right 03/2010  . FRACTURE SURGERY Left    Hip, Leg and shoulder  . INTRAMEDULLARY (IM) NAIL INTERTROCHANTERIC Left 07/30/2013   Procedure: INTRAMEDULLARY (IM) NAIL INTERTROCHANTRIC LEFT;  Surgeon: Shelda Pal, MD;  Location: [facility];  Service: Orthopedics;  Laterality: Left;  . SHOULDER SURGERY Left 03/2010    OB History  No data available       Home Medications    Prior to Admission medications   Medication Sig Start Date End Date Taking? Authorizing Provider  acetaminophen (TYLENOL) 500 MG tablet Take 1 tablet (500 mg total) by mouth 3 (three) times daily. For one week, then tid prn thereafter 06/19/15  Yes Reed, Tiffany L, DO  aspirin EC 81 MG tablet Take 1 tablet (81 mg total) by mouth  daily. 12/25/15  Yes Micki Riley, MD  cephALEXin (KEFLEX) 500 MG capsule Take 1 capsule (500 mg total) by mouth 2 (two) times daily. 01/23/17  Yes Derwood Kaplan, MD  cetirizine (ZYRTEC) 5 MG tablet Take 1 tablet (5 mg total) by mouth daily. 04/10/16  Yes Montez Morita, Monica, DO  Coenzyme Q10 (CO Q 10) 100 MG CAPS Take 1 tablet by mouth 2 (two) times daily.   Yes [provider]  colestipol (COLESTID) 1 G tablet Take 1 tablet (1 g total) by mouth daily. 10/07/14  Yes Reed, Tiffany L, DO  diphenoxylate-atropine (LOMOTIL) 2.5-0.025 MG tablet Take one tablet by mouth three times daily as needed for diarrhea (control) 04/15/16  Yes Reed, Tiffany L, DO  donepezil (ARICEPT) 10 MG tablet Take 10 mg by mouth at bedtime.   Yes [provider]  HYDROcodone-homatropine (HYCODAN) 5-1.5 MG/5ML syrup Take 5 mLs by mouth every 8 (eight) hours as needed for cough. 01/09/17  Yes Reed, Tiffany L, DO  losartan (COZAAR) 100 MG tablet Take 100 mg by mouth daily.   Yes [provider]  Melatonin 3 MG CAPS Take 1 capsule (3 mg total) by mouth at bedtime and may repeat dose one time if needed. 10/07/14  Yes Reed, Tiffany L, DO  metoprolol tartrate (LOPRESSOR) 25 MG tablet Take 25 mg by mouth 2 (two) times daily.   Yes [provider]  Multiple Vitamin (MULITIVITAMIN WITH MINERALS) TABS Take 1 tablet by mouth daily.    Yes [provider]  saccharomyces boulardii (FLORASTOR) 250 MG capsule Take 250 mg by mouth 2 (two) times daily.   Yes [provider]  Triamcinolone Acetonide (TRIAMCINOLONE 0.1 % CREAM : EUCERIN) CREA Apply 1 application topically daily as needed for rash (on face).   Yes [provider]  VAYACOG 100-19.5-6.5 MG CAPS TAKE 1 CAPSULE EVERY DAY. 10/10/16  Yes [name], DO  albuterol (PROVENTIL) (2.5 MG/3ML) 0.083% nebulizer solution Use 2.5mg  (1 vial) via nebulizer TID x 7 days then BID x 7 days the daily x 7 days and stop for pneumonia Patient not  taking: Reported on 01/22/2017 12/17/16   [name], DO    Family History Family History  Problem Relation Age of Onset  . Cancer Sister   . Heart disease Father     Social History Social History  Substance Use Topics  . Smoking status: Never Smoker  . Smokeless tobacco: Never Used  . Alcohol use 0.6 oz/week    1 Glasses of wine per week     Comment: occasionally     Allergies   Patient has no known allergies.   Review of Systems Review of Systems  All other systems reviewed and are negative.   Physical Exam Updated Vital Signs BP (!) 140/55   Pulse 100   Temp 98.9 F (37.2 C) (Oral)   Resp 18   SpO2 94%   Physical Exam  Constitutional: She is oriented to person, place, and time. She appears well-developed and well-nourished.  HENT:  Head: Normocephalic and atraumatic.  Eyes:  Pupils are equal, round, and reactive to light. Conjunctivae are normal. Right eye exhibits no discharge. Left eye exhibits no discharge. No scleral icterus.  Neck: Normal range of motion. No JVD present. No tracheal deviation present.  Pulmonary/Chest: Effort normal. No stridor.  Abdominal: Soft. Bowel sounds are normal. She exhibits no distension and no mass. There is no tenderness. There is no rebound and no guarding. No hernia.  Genitourinary:  Genitourinary Comments: Rectal exam with no dark stool red blood- vaginal speculumexam shows no blood in the vaginal vault- irritation of the urethral meatus noted no active bleeding- no discharge  Neurological: She is alert and oriented to person, place, and time. Coordination normal.  Psychiatric: She has a normal mood and affect. Her behavior is normal. Judgment and thought content normal.  Nursing note and vitals reviewed.   ED Treatments / Results  Labs (all labs ordered are listed, but only abnormal results are displayed) Labs Reviewed  CBC WITH DIFFERENTIAL/PLATELET - Abnormal; Notable for the following:       Result Value   WBC  13.7 (*)    Hemoglobin 11.8 (*)    HCT 35.4 (*)    Neutro Abs 9.0 (*)    Monocytes Absolute 1.4 (*)    All other components within normal limits  BASIC METABOLIC PANEL - Abnormal; Notable for the following:    Glucose, Bld 102 (*)    BUN 30 (*)    Creatinine, Ser 1.20 (*)    Calcium 8.6 (*)    GFR calc non Af Amer 39 (*)    GFR calc Af Amer 45 (*)    All other components within normal limits  URINALYSIS, ROUTINE W REFLEX MICROSCOPIC  POC OCCULT BLOOD, ED    EKG  EKG Interpretation None       Radiology No results found.  Procedures Procedures (including critical care time)  Medications Ordered in ED Medications - No data to display   Initial Impression / Assessment and Plan / ED Course  I have reviewed the triage vital signs and the nursing notes.  Pertinent labs & imaging results that were available during my care of the patient were reviewed by me and considered in my medical decision making (see chart for details).     Final Clinical Impressions(s) / ED Diagnoses   Final diagnoses:  Irritation of urethral meatus    83 year old female presents today with reported vaginal bleeding. She has no bleeding on my exam. She is well appearing in no acute distress.She has a negative Hemoccult here. She has no vaginal bleeding but does have irritation of the urethral meatus. I suspect this is likely secondary to recent urinary tract infection. I see no signs of infection here today. Patient does have minor elevation of WBCs with no other signs of infectious etiology. Patient does have slight elevation in her creatinine, likely secondary to decreased by mouth intake. I discussed the case with her daughter via phone report she does sometimes decrease her oral intake as she does not want to have to go to the bathroom. I encouraged increased oral intake at home. Patient had reassuring workup at her last visit,family agrees that outpatient follow-up with primary care would be  appropriate given no acute findings here today. Patient will be transferred back to living facility with outpatient follow-up.  New Prescriptions New Prescriptions   No medications on file     Rosalio Loud 01/31/17 2227    Little, Ambrose Finland, MD 02/01/17 [phone]

## 2017-02-04 ENCOUNTER — Telehealth: Payer: Self-pay

## 2017-02-04 DIAGNOSIS — Z7982 Long term (current) use of aspirin: Secondary | ICD-10-CM | POA: Diagnosis not present

## 2017-02-04 DIAGNOSIS — E119 Type 2 diabetes mellitus without complications: Secondary | ICD-10-CM | POA: Diagnosis not present

## 2017-02-04 DIAGNOSIS — M419 Scoliosis, unspecified: Secondary | ICD-10-CM | POA: Diagnosis not present

## 2017-02-04 DIAGNOSIS — R5381 Other malaise: Secondary | ICD-10-CM | POA: Diagnosis not present

## 2017-02-04 DIAGNOSIS — G309 Alzheimer's disease, unspecified: Secondary | ICD-10-CM | POA: Diagnosis not present

## 2017-02-04 DIAGNOSIS — I10 Essential (primary) hypertension: Secondary | ICD-10-CM | POA: Diagnosis not present

## 2017-02-04 DIAGNOSIS — R2681 Unsteadiness on feet: Secondary | ICD-10-CM | POA: Diagnosis not present

## 2017-02-04 NOTE — Telephone Encounter (Signed)
Darl Pikes with [facility] called to request verbal orders to add skilled nursing and medication management to current services. Patient is a resident at [facility] and this was recommended by their staff.   Per [employer] standing order, verbal order given. Message will be sent to patient's provider as a FYI.

## 2017-02-05 DIAGNOSIS — Z7982 Long term (current) use of aspirin: Secondary | ICD-10-CM | POA: Diagnosis not present

## 2017-02-05 DIAGNOSIS — E119 Type 2 diabetes mellitus without complications: Secondary | ICD-10-CM | POA: Diagnosis not present

## 2017-02-05 DIAGNOSIS — G309 Alzheimer's disease, unspecified: Secondary | ICD-10-CM | POA: Diagnosis not present

## 2017-02-05 DIAGNOSIS — I10 Essential (primary) hypertension: Secondary | ICD-10-CM | POA: Diagnosis not present

## 2017-02-05 DIAGNOSIS — R5381 Other malaise: Secondary | ICD-10-CM | POA: Diagnosis not present

## 2017-02-05 DIAGNOSIS — R2681 Unsteadiness on feet: Secondary | ICD-10-CM | POA: Diagnosis not present

## 2017-02-05 DIAGNOSIS — M419 Scoliosis, unspecified: Secondary | ICD-10-CM | POA: Diagnosis not present

## 2017-02-06 DIAGNOSIS — Z7982 Long term (current) use of aspirin: Secondary | ICD-10-CM | POA: Diagnosis not present

## 2017-02-06 DIAGNOSIS — M419 Scoliosis, unspecified: Secondary | ICD-10-CM | POA: Diagnosis not present

## 2017-02-06 DIAGNOSIS — G309 Alzheimer's disease, unspecified: Secondary | ICD-10-CM | POA: Diagnosis not present

## 2017-02-06 DIAGNOSIS — R5381 Other malaise: Secondary | ICD-10-CM | POA: Diagnosis not present

## 2017-02-06 DIAGNOSIS — I10 Essential (primary) hypertension: Secondary | ICD-10-CM | POA: Diagnosis not present

## 2017-02-06 DIAGNOSIS — R2681 Unsteadiness on feet: Secondary | ICD-10-CM | POA: Diagnosis not present

## 2017-02-06 DIAGNOSIS — E119 Type 2 diabetes mellitus without complications: Secondary | ICD-10-CM | POA: Diagnosis not present

## 2017-02-08 ENCOUNTER — Other Ambulatory Visit: Payer: Self-pay | Admitting: Internal Medicine

## 2017-02-11 DIAGNOSIS — M419 Scoliosis, unspecified: Secondary | ICD-10-CM | POA: Diagnosis not present

## 2017-02-11 DIAGNOSIS — R5381 Other malaise: Secondary | ICD-10-CM | POA: Diagnosis not present

## 2017-02-11 DIAGNOSIS — Z7982 Long term (current) use of aspirin: Secondary | ICD-10-CM | POA: Diagnosis not present

## 2017-02-11 DIAGNOSIS — R2681 Unsteadiness on feet: Secondary | ICD-10-CM | POA: Diagnosis not present

## 2017-02-11 DIAGNOSIS — G309 Alzheimer's disease, unspecified: Secondary | ICD-10-CM | POA: Diagnosis not present

## 2017-02-11 DIAGNOSIS — E119 Type 2 diabetes mellitus without complications: Secondary | ICD-10-CM | POA: Diagnosis not present

## 2017-02-11 DIAGNOSIS — I10 Essential (primary) hypertension: Secondary | ICD-10-CM | POA: Diagnosis not present

## 2017-02-13 DIAGNOSIS — E119 Type 2 diabetes mellitus without complications: Secondary | ICD-10-CM | POA: Diagnosis not present

## 2017-02-13 DIAGNOSIS — Z7982 Long term (current) use of aspirin: Secondary | ICD-10-CM | POA: Diagnosis not present

## 2017-02-13 DIAGNOSIS — R2681 Unsteadiness on feet: Secondary | ICD-10-CM | POA: Diagnosis not present

## 2017-02-13 DIAGNOSIS — G309 Alzheimer's disease, unspecified: Secondary | ICD-10-CM | POA: Diagnosis not present

## 2017-02-13 DIAGNOSIS — I10 Essential (primary) hypertension: Secondary | ICD-10-CM | POA: Diagnosis not present

## 2017-02-13 DIAGNOSIS — R5381 Other malaise: Secondary | ICD-10-CM | POA: Diagnosis not present

## 2017-02-13 DIAGNOSIS — M419 Scoliosis, unspecified: Secondary | ICD-10-CM | POA: Diagnosis not present

## 2017-02-17 DIAGNOSIS — G309 Alzheimer's disease, unspecified: Secondary | ICD-10-CM | POA: Diagnosis not present

## 2017-02-17 DIAGNOSIS — Z7982 Long term (current) use of aspirin: Secondary | ICD-10-CM | POA: Diagnosis not present

## 2017-02-17 DIAGNOSIS — M419 Scoliosis, unspecified: Secondary | ICD-10-CM | POA: Diagnosis not present

## 2017-02-17 DIAGNOSIS — I10 Essential (primary) hypertension: Secondary | ICD-10-CM | POA: Diagnosis not present

## 2017-02-17 DIAGNOSIS — R2681 Unsteadiness on feet: Secondary | ICD-10-CM | POA: Diagnosis not present

## 2017-02-17 DIAGNOSIS — R5381 Other malaise: Secondary | ICD-10-CM | POA: Diagnosis not present

## 2017-02-17 DIAGNOSIS — E119 Type 2 diabetes mellitus without complications: Secondary | ICD-10-CM | POA: Diagnosis not present

## 2017-02-18 DIAGNOSIS — R2681 Unsteadiness on feet: Secondary | ICD-10-CM | POA: Diagnosis not present

## 2017-02-18 DIAGNOSIS — M419 Scoliosis, unspecified: Secondary | ICD-10-CM | POA: Diagnosis not present

## 2017-02-18 DIAGNOSIS — Z7982 Long term (current) use of aspirin: Secondary | ICD-10-CM | POA: Diagnosis not present

## 2017-02-18 DIAGNOSIS — I10 Essential (primary) hypertension: Secondary | ICD-10-CM | POA: Diagnosis not present

## 2017-02-18 DIAGNOSIS — E119 Type 2 diabetes mellitus without complications: Secondary | ICD-10-CM | POA: Diagnosis not present

## 2017-02-18 DIAGNOSIS — G309 Alzheimer's disease, unspecified: Secondary | ICD-10-CM | POA: Diagnosis not present

## 2017-02-18 DIAGNOSIS — R5381 Other malaise: Secondary | ICD-10-CM | POA: Diagnosis not present

## 2017-02-19 DIAGNOSIS — E119 Type 2 diabetes mellitus without complications: Secondary | ICD-10-CM | POA: Diagnosis not present

## 2017-02-19 DIAGNOSIS — G309 Alzheimer's disease, unspecified: Secondary | ICD-10-CM | POA: Diagnosis not present

## 2017-02-19 DIAGNOSIS — Z7982 Long term (current) use of aspirin: Secondary | ICD-10-CM | POA: Diagnosis not present

## 2017-02-19 DIAGNOSIS — R2681 Unsteadiness on feet: Secondary | ICD-10-CM | POA: Diagnosis not present

## 2017-02-19 DIAGNOSIS — R5381 Other malaise: Secondary | ICD-10-CM | POA: Diagnosis not present

## 2017-02-19 DIAGNOSIS — I10 Essential (primary) hypertension: Secondary | ICD-10-CM | POA: Diagnosis not present

## 2017-02-19 DIAGNOSIS — M419 Scoliosis, unspecified: Secondary | ICD-10-CM | POA: Diagnosis not present

## 2017-02-20 DIAGNOSIS — G309 Alzheimer's disease, unspecified: Secondary | ICD-10-CM | POA: Diagnosis not present

## 2017-02-20 DIAGNOSIS — R5381 Other malaise: Secondary | ICD-10-CM | POA: Diagnosis not present

## 2017-02-20 DIAGNOSIS — M419 Scoliosis, unspecified: Secondary | ICD-10-CM | POA: Diagnosis not present

## 2017-02-20 DIAGNOSIS — Z7982 Long term (current) use of aspirin: Secondary | ICD-10-CM | POA: Diagnosis not present

## 2017-02-20 DIAGNOSIS — R2681 Unsteadiness on feet: Secondary | ICD-10-CM | POA: Diagnosis not present

## 2017-02-20 DIAGNOSIS — I10 Essential (primary) hypertension: Secondary | ICD-10-CM | POA: Diagnosis not present

## 2017-02-20 DIAGNOSIS — E119 Type 2 diabetes mellitus without complications: Secondary | ICD-10-CM | POA: Diagnosis not present

## 2017-02-26 ENCOUNTER — Ambulatory Visit (INDEPENDENT_AMBULATORY_CARE_PROVIDER_SITE_OTHER): Payer: Medicare Other | Admitting: Nurse Practitioner

## 2017-02-26 ENCOUNTER — Encounter: Payer: Self-pay | Admitting: Nurse Practitioner

## 2017-02-26 VITALS — BP 120/70 | HR 58 | Temp 98.2°F | Resp 18

## 2017-02-26 DIAGNOSIS — M6281 Muscle weakness (generalized): Secondary | ICD-10-CM | POA: Diagnosis not present

## 2017-02-26 DIAGNOSIS — R399 Unspecified symptoms and signs involving the genitourinary system: Secondary | ICD-10-CM | POA: Diagnosis not present

## 2017-02-26 DIAGNOSIS — R278 Other lack of coordination: Secondary | ICD-10-CM | POA: Diagnosis not present

## 2017-02-26 DIAGNOSIS — N939 Abnormal uterine and vaginal bleeding, unspecified: Secondary | ICD-10-CM | POA: Diagnosis not present

## 2017-02-26 LAB — CBC WITH DIFFERENTIAL/PLATELET
BASOS PCT: 0.5 %
Basophils Absolute: 39 cells/uL (ref 0–200)
EOS PCT: 7.7 %
Eosinophils Absolute: 601 cells/uL — ABNORMAL HIGH (ref 15–500)
HEMATOCRIT: 37 % (ref 35.0–45.0)
HEMOGLOBIN: 12.1 g/dL (ref 11.7–15.5)
LYMPHS ABS: 3237 {cells}/uL (ref 850–3900)
MCH: 29.8 pg (ref 27.0–33.0)
MCHC: 32.7 g/dL (ref 32.0–36.0)
MCV: 91.1 fL (ref 80.0–100.0)
MPV: 10.9 fL (ref 7.5–12.5)
Monocytes Relative: 12.1 %
NEUTROS ABS: 2980 {cells}/uL (ref 1500–7800)
NEUTROS PCT: 38.2 %
Platelets: 202 10*3/uL (ref 140–400)
RBC: 4.06 10*6/uL (ref 3.80–5.10)
RDW: 12.9 % (ref 11.0–15.0)
Total Lymphocyte: 41.5 %
WBC: 7.8 10*3/uL (ref 3.8–10.8)
WBCMIX: 944 {cells}/uL (ref 200–950)

## 2017-02-26 NOTE — Patient Instructions (Signed)
To use barrier cream and pat when cleaning with moist toilettes - avoid wiping or rubbing area

## 2017-02-26 NOTE — Progress Notes (Signed)
Careteam: Patient Care Team: [name], DO as PCP - General (Internal Medicine)  Advanced Directive information Does Patient Have a Medical Advance Directive?: Yes, Type of Advance Directive: Healthcare Power of [place];Living will;Out of facility DNR (pink MOST or yellow form), Does patient want to make changes to medical advance directive?: No - Patient declined  No Known Allergies  Chief Complaint  Patient presents with  . Acute Visit    Possible UTI     HPI: Patient is a 83 y.o. female seen in the office today due to vaginal bleeding again a few night ago which has been ongoing.  Pt had been to the ED on 02/01/17 and exam revealed external irritation which they suspected the bleeding was coming from this. Son here today and said nurse could not quantify amount but stated there were blood clots in her brief, none today.  No blood in stools per son.  Vaginal exam in ED without bleeding noted. Rectal exam was negative as well.  UA was negative.  No abdominal pain, no dysuria, no fever.  It was recommended to use barrier cream and use moist toilettes which they have not been doing  Review of Systems:  Review of Systems  Constitutional: Negative for chills, fever, malaise/fatigue and weight loss.  Genitourinary: Negative for dysuria, frequency and urgency.  Musculoskeletal: Negative for myalgias.  Skin: Negative for itching and rash.  Neurological: Negative for weakness.     Past Medical History:  Diagnosis Date  . Cardiac catheterisation as the cause of abnormal reaction of patient, or of later complication 10/05/2006  . Dysphagia, unspecified(787.20)   . Encounter for long-term (current) use of other medications   . High cholesterol   . Hyperosmolality and/or hypernatremia   . Hypertension   . Osteoporosis   . Other atopic dermatitis and related conditions   . Type II or unspecified type diabetes mellitus without mention of complication, not stated as  uncontrolled   . Vascular dementia 06/22/2013   Past Surgical History:  Procedure Laterality Date  . FEMUR IM NAIL  07/24/2011   Procedure: INTRAMEDULLARY (IM) NAIL FEMORAL;  Surgeon: Javier Docker, MD;  Location: [facility];  Service: Orthopedics;  Laterality: Right;  Biomet, c-arm, Jackson table  . FEMUR SURGERY Right 03/2010  . FRACTURE SURGERY Left    Hip, Leg and shoulder  . INTRAMEDULLARY (IM) NAIL INTERTROCHANTERIC Left 07/30/2013   Procedure: INTRAMEDULLARY (IM) NAIL INTERTROCHANTRIC LEFT;  Surgeon: Shelda Pal, MD;  Location: [facility];  Service: Orthopedics;  Laterality: Left;  . SHOULDER SURGERY Left 03/2010   Social History:   reports that she has never smoked. She has never used smokeless tobacco. She reports that she drinks about 0.6 oz of alcohol per week . She reports that she does not use drugs.  Family History  Problem Relation Age of Onset  . Cancer Sister   . Heart disease Father     Medications: Patient's Medications  New Prescriptions   No medications on file  Previous Medications   ACETAMINOPHEN (TYLENOL) 500 MG TABLET    Take 1 tablet (500 mg total) by mouth 3 (three) times daily. For one week, then tid prn thereafter   ALBUTEROL (PROVENTIL) (2.5 MG/3ML) 0.083% NEBULIZER SOLUTION    Use 2.5mg  (1 vial) via nebulizer TID x 7 days then BID x 7 days the daily x 7 days and stop for pneumonia   ASPIRIN EC 81 MG TABLET    Take 1 tablet (81 mg total) by mouth  daily.   CETIRIZINE (ZYRTEC) 5 MG TABLET    Take 1 tablet (5 mg total) by mouth daily.   COENZYME Q10 (CO Q 10) 100 MG CAPS    Take 1 tablet by mouth 2 (two) times daily.   COLESTIPOL (COLESTID) 1 G TABLET    Take 1 tablet (1 g total) by mouth daily.   DIPHENOXYLATE-ATROPINE (LOMOTIL) 2.5-0.025 MG TABLET    Take one tablet by mouth three times daily as needed for diarrhea (control)   DONEPEZIL (ARICEPT) 10 MG TABLET    Take 10 mg by mouth at bedtime.   HYDROCODONE-HOMATROPINE (HYCODAN) 5-1.5 MG/5ML SYRUP    Take 5  mLs by mouth every 8 (eight) hours as needed for cough.   LOSARTAN (COZAAR) 100 MG TABLET    Take 100 mg by mouth daily.   MELATONIN 3 MG CAPS    Take 1 capsule (3 mg total) by mouth at bedtime and may repeat dose one time if needed.   METOPROLOL TARTRATE (LOPRESSOR) 25 MG TABLET    Take 25 mg by mouth 2 (two) times daily.   MULTIPLE VITAMIN (MULITIVITAMIN WITH MINERALS) TABS    Take 1 tablet by mouth daily.    SACCHAROMYCES BOULARDII (FLORASTOR) 250 MG CAPSULE    Take 250 mg by mouth 2 (two) times daily.   TRIAMCINOLONE ACETONIDE (TRIAMCINOLONE 0.1 % CREAM : EUCERIN) CREA    Apply 1 application topically daily as needed for rash (on face).   VAYACOG 100-19.5-6.5 MG CAPS    TAKE 1 CAPSULE EVERY DAY.  Modified Medications   No medications on file  Discontinued Medications   CEPHALEXIN (KEFLEX) 500 MG CAPSULE    Take 1 capsule (500 mg total) by mouth 2 (two) times daily.     Physical Exam:  Vitals:   02/26/17 1342  BP: 120/70  Pulse: (!) 58  Resp: 18  Temp: 98.2 F (36.8 C)  TempSrc: Oral  SpO2: 95%   There is no height or weight on file to calculate BMI.  Physical Exam  Constitutional: She appears well-developed.  Frail appearing in NAD  HENT:  MM dry; no oral thrush  Neck: Neck supple.  Cardiovascular: Normal rate, regular rhythm and intact distal pulses.  Exam reveals no gallop and no friction rub.   Murmur (1/6 SEM) heard. Pulmonary/Chest: Effort normal and breath sounds normal. She has no rhonchi.  Abdominal: Soft. Bowel sounds are normal. She exhibits no distension and no mass. There is no tenderness. There is no guarding.  Genitourinary: No vaginal discharge found.  Genitourinary Comments: Irritation noted to urethral opening. No blood in vaginal area.  Musculoskeletal: She exhibits edema. She exhibits no tenderness.  Lymphadenopathy:    She has no cervical adenopathy.  Neurological: She is alert.  Skin: Skin is warm and dry. No rash noted.  Psychiatric: She has a  normal mood and affect. Her behavior is normal.    Labs reviewed: Basic Metabolic Panel:  Recent Labs  69/62/95 1529 01/07/17 1126 01/23/17 0011 01/31/17 2033  NA 140 142 140 140  K 4.6 5.0 4.8 3.8  CL 106 107 108 108  CO2 GLUCOSE 104* 82 106* 102*  BUN 23 24 28* 30*  CREATININE 1.20* 0.97* 1.07* 1.20*  CALCIUM 9.2 9.1 9.0 8.6*  TSH 1.84  --   --   --    Liver Function Tests:  Recent Labs  04/10/16 1529 01/07/17 1126 01/23/17 0011  AST  --  16 23  ALT  13 10 13*  ALKPHOS  --  65 62  BILITOT  --  0.6 0.4  PROT  --  6.3 6.9  ALBUMIN  --  3.6 3.7    Recent Labs  01/23/17 0011  LIPASE 57*   No results for input(s): AMMONIA in the last 8760 hours. CBC:  Recent Labs  01/07/17 1126 01/23/17 0011 01/31/17 2033  WBC 7.1 9.0 13.7*  NEUTROABS 3,266  --  9.0*  HGB 12.3 12.6 11.8*  HCT 37.6 37.7 35.4*  MCV 93.1 90.8 91.5  PLT 193 191 178   Lipid Panel:  Recent Labs  04/10/16 1529 01/07/17 1126  CHOL 227* 231*  HDL 45* 42*  LDLCALC 138* 161*  TRIG 218* 192*  CHOLHDL 5.0 5.5*   TSH:  Recent Labs  04/10/16 1529  TSH 1.84   A1C: Lab Results  Component Value Date   HGBA1C 5.5 01/07/2017     Assessment/Plan 1. Vaginal bleeding -no signs of UTI, pt does not wish to go to urologist or gyn for further workup. No bleeding/blood noted today. UA normal in hospital when she had similar symptoms.  -encouraged to use barrier cream and use moist toilettes. to pat area and avoid excessive wiping.  - CBC with Differential/Platelets  Janene Harvey. Biagio Borg  [facility] [phone] 8 am - 5 pm) [phone] (after hours)

## 2017-02-27 ENCOUNTER — Encounter: Payer: Self-pay | Admitting: Nurse Practitioner

## 2017-03-06 ENCOUNTER — Other Ambulatory Visit: Payer: Self-pay | Admitting: Internal Medicine

## 2017-03-28 DIAGNOSIS — R278 Other lack of coordination: Secondary | ICD-10-CM | POA: Diagnosis not present

## 2017-03-28 DIAGNOSIS — M6281 Muscle weakness (generalized): Secondary | ICD-10-CM | POA: Diagnosis not present

## 2017-03-28 DIAGNOSIS — R399 Unspecified symptoms and signs involving the genitourinary system: Secondary | ICD-10-CM | POA: Diagnosis not present

## 2017-04-09 ENCOUNTER — Encounter: Payer: Self-pay | Admitting: Nurse Practitioner

## 2017-04-09 ENCOUNTER — Ambulatory Visit (INDEPENDENT_AMBULATORY_CARE_PROVIDER_SITE_OTHER): Payer: Medicare Other | Admitting: Nurse Practitioner

## 2017-04-09 VITALS — BP 118/72 | HR 62 | Temp 97.8°F | Resp 17 | Ht 60.0 in | Wt 132.0 lb

## 2017-04-09 DIAGNOSIS — R05 Cough: Secondary | ICD-10-CM | POA: Diagnosis not present

## 2017-04-09 DIAGNOSIS — G309 Alzheimer's disease, unspecified: Secondary | ICD-10-CM | POA: Diagnosis not present

## 2017-04-09 DIAGNOSIS — N939 Abnormal uterine and vaginal bleeding, unspecified: Secondary | ICD-10-CM

## 2017-04-09 DIAGNOSIS — F0280 Dementia in other diseases classified elsewhere without behavioral disturbance: Secondary | ICD-10-CM | POA: Diagnosis not present

## 2017-04-09 DIAGNOSIS — F0150 Vascular dementia without behavioral disturbance: Secondary | ICD-10-CM

## 2017-04-09 DIAGNOSIS — I10 Essential (primary) hypertension: Secondary | ICD-10-CM

## 2017-04-09 DIAGNOSIS — J302 Other seasonal allergic rhinitis: Secondary | ICD-10-CM | POA: Diagnosis not present

## 2017-04-09 DIAGNOSIS — R059 Cough, unspecified: Secondary | ICD-10-CM

## 2017-04-09 NOTE — Progress Notes (Signed)
Careteam: Patient Care Team: [name], DO as PCP - General (Internal Medicine)  Advanced Directive information Does Patient Have a Medical Advance Directive?: Yes, Type of Advance Directive: Out of facility DNR (pink MOST or yellow form)  No Known Allergies  Chief Complaint  Patient presents with  . Medical Management of Chronic Issues    Pt is being seen for a 3 month routine visit. Pt has no concerns today.   Beverly Barnett    Grandson in room     HPI: Patient is a 83 y.o. female seen in the office today for routine follow up. Pt with hx of dementia, htn, unsteady gait/left hemiparesis, hyperlipidemia, constipation.   htn- controlled on lopressor.   Constipation- not currently an issues  Dementia- grandson reports this is getting worse    Insomnia- sleeping well at night  Eating well.  Vaginal bleeding- another episode a month ago, none since.     Review of Systems:  Review of Systems  Constitutional: Negative for chills, fever, malaise/fatigue and weight loss.  HENT: Negative for hearing loss.   Respiratory: Negative for cough, sputum production and shortness of breath.   Cardiovascular: Negative for chest pain and palpitations.  Gastrointestinal: Negative for constipation, diarrhea and heartburn.  Genitourinary: Negative for dysuria, frequency and urgency.  Musculoskeletal: Negative for falls and myalgias.       Wheelchair bound  Skin: Negative for itching and rash.  Neurological: Positive for weakness. Negative for dizziness.  Psychiatric/Behavioral: Positive for memory loss.    Past Medical History:  Diagnosis Date  . Cardiac catheterisation as the cause of abnormal reaction of patient, or of later complication 10/05/2006  . Dysphagia, unspecified(787.20)   . Encounter for long-term (current) use of other medications   . High cholesterol   . Hyperosmolality and/or hypernatremia   . Hypertension   . Osteoporosis   . Other atopic dermatitis and  related conditions   . Type II or unspecified type diabetes mellitus without mention of complication, not stated as uncontrolled   . Vascular dementia 06/22/2013   Past Surgical History:  Procedure Laterality Date  . FEMUR IM NAIL  07/24/2011   Procedure: INTRAMEDULLARY (IM) NAIL FEMORAL;  Surgeon: Javier Docker, MD;  Location: [facility];  Service: Orthopedics;  Laterality: Right;  Biomet, c-arm, Jackson table  . FEMUR SURGERY Right 03/2010  . FRACTURE SURGERY Left    Hip, Leg and shoulder  . INTRAMEDULLARY (IM) NAIL INTERTROCHANTERIC Left 07/30/2013   Procedure: INTRAMEDULLARY (IM) NAIL INTERTROCHANTRIC LEFT;  Surgeon: Shelda Pal, MD;  Location: [facility];  Service: Orthopedics;  Laterality: Left;  . SHOULDER SURGERY Left 03/2010   Social History:   reports that she has never smoked. She has never used smokeless tobacco. She reports that she drinks about 0.6 oz of alcohol per week . She reports that she does not use drugs.  Family History  Problem Relation Age of Onset  . Cancer Sister   . Heart disease Father     Medications: Patient's Medications  New Prescriptions   No medications on file  Previous Medications   ACETAMINOPHEN (TYLENOL) 500 MG TABLET    Take 1 tablet (500 mg total) by mouth 3 (three) times daily. For one week, then tid prn thereafter   ASPIRIN EC 81 MG TABLET    Take 1 tablet (81 mg total) by mouth daily.   CETIRIZINE (ZYRTEC) 5 MG TABLET    Take 1 tablet (5 mg total) by mouth daily.   COENZYME  Q10 (CO Q 10) 100 MG CAPS    Take 1 tablet by mouth 2 (two) times daily.   COLESTIPOL (COLESTID) 1 G TABLET    Take 1 tablet (1 g total) by mouth daily.   DIPHENOXYLATE-ATROPINE (LOMOTIL) 2.5-0.025 MG TABLET    Take one tablet by mouth three times daily as needed for diarrhea (control)   DONEPEZIL (ARICEPT) 10 MG TABLET    Take 10 mg by mouth at bedtime.   HYDROCODONE-HOMATROPINE (HYCODAN) 5-1.5 MG/5ML SYRUP    Take 5 mLs by mouth every 8 (eight) hours as needed for cough.     LOSARTAN (COZAAR) 100 MG TABLET    Take 100 mg by mouth daily.   MELATONIN 3 MG CAPS    Take 1 capsule (3 mg total) by mouth at bedtime and may repeat dose one time if needed.   METOPROLOL TARTRATE (LOPRESSOR) 25 MG TABLET    Take 25 mg by mouth 2 (two) times daily.   MULTIPLE VITAMIN (MULITIVITAMIN WITH MINERALS) TABS    Take 1 tablet by mouth daily.    SACCHAROMYCES BOULARDII (FLORASTOR) 250 MG CAPSULE    Take 250 mg by mouth 2 (two) times daily.   TRIAMCINOLONE ACETONIDE (TRIAMCINOLONE 0.1 % CREAM : EUCERIN) CREA    Apply 1 application topically daily as needed for rash (on face).   VAYACOG 100-19.5-6.5 MG CAPS    TAKE 1 CAPSULE EVERY DAY.  Modified Medications   No medications on file  Discontinued Medications   No medications on file     Physical Exam:  Vitals:   04/09/17 1422  BP: 118/72  Pulse: 62  Resp: 17  Temp: 97.8 F (36.6 C)  TempSrc: Oral  SpO2: 96%  Weight: 132 lb (59.9 kg)  Height: 5' (1.524 m)   Body mass index is 25.78 kg/m.  Physical Exam  Constitutional: She appears well-developed.  Frail appearing in NAD  HENT:  Mouth/Throat: Oropharynx is clear and moist. No oropharyngeal exudate.  Eyes: Pupils are equal, round, and reactive to light. No scleral icterus.  Neck: Neck supple. Carotid bruit is not present. No tracheal deviation present. No thyromegaly present.  Cardiovascular: Normal rate, regular rhythm and intact distal pulses.  Exam reveals no gallop and no friction rub.   Murmur (1/6 SEM) heard. Pulmonary/Chest: Effort normal and breath sounds normal. No stridor.  Abdominal: Soft. Bowel sounds are normal. She exhibits no distension and no mass. There is no hepatomegaly. There is no tenderness. There is no rebound and no guarding.  Musculoskeletal: She exhibits no tenderness.  Thoracic kyphoscoliosis; poor posture  Lymphadenopathy:    She has no cervical adenopathy.  Neurological: She is alert. Gait (wheelchair bound) abnormal.   strength 4/5  b/l LE; speech slurred  Skin: Skin is warm and dry. No rash noted.  Psychiatric: She has a normal mood and affect. Her behavior is normal. Her speech is delayed and slurred. Cognition and memory are impaired.    Labs reviewed: Basic Metabolic Panel:  Recent Labs  40/98/11 1529 01/07/17 1126 01/23/17 0011 01/31/17 2033  NA 140 142 140 140  K 4.6 5.0 4.8 3.8  CL 106 107 108 108  CO2 23 24 24 25   GLUCOSE 104* 82 106* 102*  BUN 23 24 28* 30*  CREATININE 1.20* 0.97* 1.07* 1.20*  CALCIUM 9.2 9.1 9.0 8.6*  TSH 1.84  --   --   --    Liver Function Tests:  Recent Labs  04/10/16 1529 01/07/17 1126 01/23/17 0011  AST  --  16 23  ALT 13 10 13*  ALKPHOS  --  65 62  BILITOT  --  0.6 0.4  PROT  --  6.3 6.9  ALBUMIN  --  3.6 3.7    Recent Labs  01/23/17 0011  LIPASE 57*   No results for input(s): AMMONIA in the last 8760 hours. CBC:  Recent Labs  01/07/17 1126 01/23/17 0011 01/31/17 2033 02/26/17 1440  WBC 7.1 9.0 13.7* 7.8  NEUTROABS 3,266  --  9.0* 2,980  HGB 12.3 12.6 11.8* 12.1  HCT 37.6 37.7 35.4* 37.0  MCV 93.1 90.8 91.5 91.1  PLT 193 191 178 202   Lipid Panel:  Recent Labs  04/10/16 1529 01/07/17 1126  CHOL 227* 231*  HDL 45* 42*  LDLCALC 138* 161151*  TRIG 218* 192*  CHOLHDL 5.0 5.5*   TSH:  Recent Labs  04/10/16 1529  TSH 1.84   A1C: Lab Results  Component Value Date   HGBA1C 5.5 01/07/2017     Assessment/Plan 1. Essential hypertension Stable. Will cont current regimen.  2. Vaginal bleeding Had 1 episode about a month ago, non since per grandson, pt does not wish for this to be worked up.   3. Mixed Alzheimer's and vascular dementia Stable, progressive decline per grandson. Pt keeps talking about wanting to go "home" denies depression or anxiety. Living at assisted living who helps with care.   4. Seasonal allergic rhinitis, unspecified trigger Unchanged, worse in the winter. using zyrtec as needed   5. Cough Resolved, will dc  cough syrup with hydrocodone.   Next appt: 3 months, sooner if needed  Willis Kuipers K. Biagio BorgEubanks, AGNP  [facility] [phone](Monday-Friday 8 am - 5 pm) [phone] (after hours)

## 2017-04-28 DIAGNOSIS — M6281 Muscle weakness (generalized): Secondary | ICD-10-CM | POA: Diagnosis not present

## 2017-04-28 DIAGNOSIS — R278 Other lack of coordination: Secondary | ICD-10-CM | POA: Diagnosis not present

## 2017-04-28 DIAGNOSIS — R399 Unspecified symptoms and signs involving the genitourinary system: Secondary | ICD-10-CM | POA: Diagnosis not present

## 2017-05-07 ENCOUNTER — Other Ambulatory Visit: Payer: Self-pay | Admitting: Internal Medicine

## 2017-05-13 DIAGNOSIS — H5203 Hypermetropia, bilateral: Secondary | ICD-10-CM | POA: Diagnosis not present

## 2017-07-02 ENCOUNTER — Ambulatory Visit: Payer: Medicare Other | Admitting: Nurse Practitioner

## 2017-08-03 ENCOUNTER — Emergency Department (HOSPITAL_COMMUNITY): Payer: Medicare Other

## 2017-08-03 ENCOUNTER — Encounter (HOSPITAL_COMMUNITY): Payer: Self-pay | Admitting: Nurse Practitioner

## 2017-08-03 ENCOUNTER — Emergency Department (HOSPITAL_COMMUNITY)
Admission: EM | Admit: 2017-08-03 | Discharge: 2017-08-04 | Disposition: A | Discharge status: Home / Self Care | Payer: Medicare Other | Attending: Emergency Medicine | Admitting: Emergency Medicine

## 2017-08-03 DIAGNOSIS — F0150 Vascular dementia without behavioral disturbance: Secondary | ICD-10-CM | POA: Diagnosis not present

## 2017-08-03 DIAGNOSIS — I10 Essential (primary) hypertension: Secondary | ICD-10-CM | POA: Diagnosis not present

## 2017-08-03 DIAGNOSIS — G309 Alzheimer's disease, unspecified: Secondary | ICD-10-CM | POA: Diagnosis not present

## 2017-08-03 DIAGNOSIS — N950 Postmenopausal bleeding: Secondary | ICD-10-CM | POA: Diagnosis present

## 2017-08-03 DIAGNOSIS — Z7982 Long term (current) use of aspirin: Secondary | ICD-10-CM | POA: Diagnosis not present

## 2017-08-03 DIAGNOSIS — Z79899 Other long term (current) drug therapy: Secondary | ICD-10-CM | POA: Diagnosis not present

## 2017-08-03 DIAGNOSIS — N3001 Acute cystitis with hematuria: Secondary | ICD-10-CM | POA: Diagnosis not present

## 2017-08-03 DIAGNOSIS — E119 Type 2 diabetes mellitus without complications: Secondary | ICD-10-CM | POA: Diagnosis not present

## 2017-08-03 DIAGNOSIS — R5383 Other fatigue: Secondary | ICD-10-CM | POA: Diagnosis not present

## 2017-08-03 DIAGNOSIS — R58 Hemorrhage, not elsewhere classified: Secondary | ICD-10-CM | POA: Diagnosis not present

## 2017-08-03 DIAGNOSIS — N939 Abnormal uterine and vaginal bleeding, unspecified: Secondary | ICD-10-CM

## 2017-08-03 LAB — CBC WITH DIFFERENTIAL/PLATELET
Basophils Absolute: 0 10*3/uL (ref 0.0–0.1)
Basophils Relative: 0 %
Eosinophils Absolute: 0.4 10*3/uL (ref 0.0–0.7)
Eosinophils Relative: 5 %
HCT: 39 % (ref 36.0–46.0)
Hemoglobin: 12.8 g/dL (ref 12.0–15.0)
Lymphocytes Relative: 34 %
Lymphs Abs: 3 10*3/uL (ref 0.7–4.0)
MCH: 30.9 pg (ref 26.0–34.0)
MCHC: 32.8 g/dL (ref 30.0–36.0)
MCV: 94.2 fL (ref 78.0–100.0)
Monocytes Absolute: 1 10*3/uL (ref 0.1–1.0)
Monocytes Relative: 11 %
Neutro Abs: 4.4 10*3/uL (ref 1.7–7.7)
Neutrophils Relative %: 50 %
Platelets: 190 10*3/uL (ref 150–400)
RBC: 4.14 MIL/uL (ref 3.87–5.11)
RDW: 13.6 % (ref 11.5–15.5)
WBC: 8.8 10*3/uL (ref 4.0–10.5)

## 2017-08-03 LAB — BASIC METABOLIC PANEL
Anion gap: 12 (ref 5–15)
BUN: 32 mg/dL — ABNORMAL HIGH (ref 6–20)
CO2: 23 mmol/L (ref 22–32)
Calcium: 9.3 mg/dL (ref 8.9–10.3)
Chloride: 108 mmol/L (ref 101–111)
Creatinine, Ser: 1.17 mg/dL — ABNORMAL HIGH (ref 0.44–1.00)
GFR calc Af Amer: 46 mL/min — ABNORMAL LOW (ref >60)
GFR calc non Af Amer: 39 mL/min — ABNORMAL LOW (ref >60)
Glucose, Bld: 117 mg/dL — ABNORMAL HIGH (ref 65–99)
Potassium: 4.3 mmol/L (ref 3.5–5.1)
Sodium: 143 mmol/L (ref 135–145)

## 2017-08-03 LAB — WET PREP, GENITAL
Clue Cells Wet Prep HPF POC: NONE SEEN
Sperm: NONE SEEN
Trich, Wet Prep: NONE SEEN
Yeast Wet Prep HPF POC: NONE SEEN

## 2017-08-03 NOTE — ED Notes (Signed)
Bed: WA20 Expected date:  Expected time:  Means of arrival:  Comments: [id number] vag bleed

## 2017-08-03 NOTE — ED Triage Notes (Signed)
[place] [facility] with c/o vaginal blood found on her depends. Pt is denying any pain.

## 2017-08-03 NOTE — ED Provider Notes (Signed)
[facility]-EMERGENCY DEPT Provider Note   CSN: [id number] Arrival date & time: 08/03/17  1937     History   Chief Complaint Chief Complaint  Patient presents with  . Vaginal Bleeding    HPI Beverly Barnett is a 84 y.o. female with history of Alzheimer's dementia and vascular dementia, HLD, HTN, osteoporosis, type 2 diabetes presents today accompanied by daughter for evaluation of vaginal bleeding.  Patient's nursing home sent her over for evaluation stating adult diaper they noted bright red blood in the diaper.  She has had episodes like this in the past which were thought to be secondary to UTI versus superficial skin irritation.  Patient currently denies any pain, urinary symptoms, or vaginal symptoms.  Patient's daughter states that she is at her mental status baseline and has been generally weak for some time but that this improved today.  The history is provided by the patient.    Past Medical History:  Diagnosis Date  . Cardiac catheterisation as the cause of abnormal reaction of patient, or of later complication 10/05/2006  . Dysphagia, unspecified(787.20)   . Encounter for long-term (current) use of other medications   . High cholesterol   . Hyperosmolality and/or hypernatremia   . Hypertension   . Osteoporosis   . Other atopic dermatitis and related conditions   . Type II or unspecified type diabetes mellitus without mention of complication, not stated as uncontrolled   . Vascular dementia 06/22/2013    Patient Active Problem List   Diagnosis Date Noted  . Cough 12/17/2016  . Seasonal allergic rhinitis 07/19/2016  . Left hemiparesis (HCC) 12/25/2015  . Abnormal brain MRI 12/25/2015  . Hydrocephalus ex vacuo 12/25/2015  . Psoriasis 11/29/2015  . 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 Essential hypertension 04/09/2015  . Unsteady gait 04/09/2015  . Frequent falls 12/09/2014  . 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 Insomnia 01/11/2014   . Dementia, vascular, mixed 10/13/2013  . Left leg weakness 10/13/2013  . Anemia 07/29/2013  . Hyperlipidemia with target LDL less than 130 06/22/2013  . Osteoporosis 01/05/2013    Past Surgical History:  Procedure Laterality Date  . FEMUR IM NAIL  07/24/2011   Procedure: INTRAMEDULLARY (IM) NAIL FEMORAL;  Surgeon: Javier DockerJeffrey C Beane, MD;  Location: [facility];  Service: Orthopedics;  Laterality: Right;  Biomet, c-arm, Jackson table  . FEMUR SURGERY Right 03/2010  . FRACTURE SURGERY Left    Hip, Leg and shoulder  . INTRAMEDULLARY (IM) NAIL INTERTROCHANTERIC Left 07/30/2013   Procedure: INTRAMEDULLARY (IM) NAIL INTERTROCHANTRIC LEFT;  Surgeon: Shelda PalMatthew D Olin, MD;  Location: [facility];  Service: Orthopedics;  Laterality: Left;  . SHOULDER SURGERY Left 03/2010    OB History    No data available       Home Medications    Prior to Admission medications   Medication Sig Start Date End Date Taking? Authorizing Provider  acetaminophen (TYLENOL) 500 MG tablet Take 1 tablet (500 mg total) by mouth 3 (three) times daily. For one week, then tid prn thereafter 06/19/15   Bufford Spikeseed, Tiffany L, DO  aspirin EC 81 MG tablet Take 1 tablet (81 mg total) by mouth daily. 12/25/15   Micki RileySethi, Pramod S, MD  cephALEXin (KEFLEX) 500 MG capsule Take 1 capsule (500 mg total) by mouth 3 (three) times daily for 7 days. 08/04/17 08/11/17  Michela PitcherFawze, Anton Cheramie A, PA-C  cetirizine (ZYRTEC) 5 MG tablet Take 1 tablet (5 mg total) by mouth daily. 04/10/16   Kirt Boysarter, Monica,  DO  Coenzyme Q10 (CO Q 10) 100 MG CAPS Take 1 tablet by mouth 2 (two) times daily.    [provider]  colestipol (COLESTID) 1 G tablet Take 1 tablet (1 g total) by mouth daily. 10/07/14   Reed, Tiffany L, DO  donepezil (ARICEPT) 10 MG tablet Take 10 mg by mouth at bedtime.    [provider]  losartan (COZAAR) 100 MG tablet Take 100 mg by mouth daily.    [provider]  Melatonin 3 MG CAPS Take 1 capsule (3 mg total) by mouth at bedtime and may repeat  dose one time if needed. 10/07/14   Reed, Tiffany L, DO  metoprolol tartrate (LOPRESSOR) 25 MG tablet Take 25 mg by mouth 2 (two) times daily.    [provider]  Multiple Vitamin (MULITIVITAMIN WITH MINERALS) TABS Take 1 tablet by mouth daily.     [provider]  saccharomyces boulardii (FLORASTOR) 250 MG capsule Take 250 mg by mouth 2 (two) times daily.    [provider]  Triamcinolone Acetonide (TRIAMCINOLONE 0.1 % CREAM : EUCERIN) CREA Apply 1 application topically daily as needed for rash (on face).    [provider]  VAYACOG 100-19.5-6.5 MG CAPS TAKE 1 CAPSULE EVERY DAY. 05/07/17   [name], DO  atorvastatin (LIPITOR) 10 MG tablet Take 1 tablet (10 mg total) by mouth daily. Patient not taking: Reported on 06/10/2015 10/07/14 06/10/15  Kermit Balo, DO    Family History Family History  Problem Relation Age of Onset  . Cancer Sister   . Heart disease Father     Social History Social History   Tobacco Use  . Smoking status: Never Smoker  . Smokeless tobacco: Never Used  Substance Use Topics  . Alcohol use: Yes    Alcohol/week: 0.6 oz    Types: 1 Glasses of wine per week    Comment: occasionally  . Drug use: No     Allergies   Patient has no known allergies.   Review of Systems Review of Systems  Constitutional: Positive for fatigue (improving). Negative for chills and fever.  Respiratory: Negative for shortness of breath.   Cardiovascular: Negative for chest pain.  Gastrointestinal: Negative for abdominal pain, diarrhea, nausea and vomiting.  Genitourinary: Positive for vaginal bleeding (possible). Negative for decreased urine volume, dysuria, hematuria, vaginal discharge and vaginal pain.  Neurological: Negative for syncope.  All other systems reviewed and are negative.    Physical Exam Updated Vital Signs BP 139/64 (BP Location: Right Arm)   Pulse 75   Temp 98.1 F (36.7 C) (Oral)   Resp 18   SpO2 96%    Physical Exam  Constitutional: She appears well-developed and well-nourished. No distress.  HENT:  Head: Normocephalic and atraumatic.  Eyes: Conjunctivae are normal. Right eye exhibits no discharge. Left eye exhibits no discharge.  Neck: Normal range of motion. Neck supple. No JVD present. No tracheal deviation present.  Cardiovascular: Normal rate, regular rhythm and normal heart sounds.  Pulmonary/Chest: Effort normal and breath sounds normal.  Abdominal: Soft. Bowel sounds are normal. She exhibits no distension. There is no tenderness. There is no guarding.  Genitourinary:  Genitourinary Comments: examination performed in the presence of a chaperone.  No masses or lesions to the external genitalia.  There does appear to be blood in the vaginal vault.  Vaginal walls are atrophic in nature.  Unable to visualize the cervix.  There is blood surrounding the rectum but does not appear to be  any frank rectal bleeding.  There is a small external hemorrhoid which does not appear to be thrombosed.  Musculoskeletal: Normal range of motion. She exhibits no edema.  Neurological: She is alert.  Skin: Skin is warm and dry. No erythema.  Psychiatric: She has a normal mood and affect. Her behavior is normal.  Nursing note and vitals reviewed.    ED Treatments / Results  Labs (all labs ordered are listed, but only abnormal results are displayed) Labs Reviewed  WET PREP, GENITAL - Abnormal; Notable for the following components:      Result Value   WBC, Wet Prep HPF POC MANY (*)    All other components within normal limits  URINALYSIS, ROUTINE W REFLEX MICROSCOPIC - Abnormal; Notable for the following components:   APPearance HAZY (*)    Hgb urine dipstick LARGE (*)    Nitrite POSITIVE (*)    Leukocytes, UA MODERATE (*)    Bacteria, UA MANY (*)    Squamous Epithelial / LPF 0-5 (*)    All other components within normal limits  BASIC METABOLIC PANEL - Abnormal; Notable for the following  components:   Glucose, Bld 117 (*)    BUN 32 (*)    Creatinine, Ser 1.17 (*)    GFR calc non Af Amer 39 (*)    GFR calc Af Amer 46 (*)    All other components within normal limits  URINE CULTURE  CBC WITH DIFFERENTIAL/PLATELET    EKG  EKG Interpretation None       Radiology US Pelvis Transvanginal Non-ob (tv Only)  Result Date: 08/03/2017 CLINICAL DATA:  84 year old female with vaginal bleeding. EXAM: TRANSABDOMINAL AND TRANSVAGINAL ULTRASOUND OF PELVIS TECHNIQUE: Both transabdominal and transvaginal ultrasound examinations of the pelvis were performed. Transabdominal technique was performed for global imaging of the pelvis including uterus, ovaries, adnexal regions, and pelvic cul-de-sac. It was necessary to proceed with endovaginal exam following the transabdominal exam to visualize the ovaries and endometrium. COMPARISON:  01/23/2017 FINDINGS: Uterus Measurements: 4.1 x 2.5 x 3.7 cm. No fibroids or other mass visualized. Endometrium Thickness: Not well evaluated but appears to measure 2 mm. No focal abnormality visualized. Right ovary Not visualized Left ovary Not visualized Other findings No abnormal free fluid. IMPRESSION: 1. Endometrium not will the evaluated but does not appear thickened. 2. Uterus otherwise unremarkable 3. Bilateral ovaries not visualized. Electronically Signed   By: Harmon Pier M.D.   On: 08/03/2017 23:14   US Pelvis Complete  Result Date: 08/03/2017 CLINICAL DATA:  84 year old female with vaginal bleeding. EXAM: TRANSABDOMINAL AND TRANSVAGINAL ULTRASOUND OF PELVIS TECHNIQUE: Both transabdominal and transvaginal ultrasound examinations of the pelvis were performed. Transabdominal technique was performed for global imaging of the pelvis including uterus, ovaries, adnexal regions, and pelvic cul-de-sac. It was necessary to proceed with endovaginal exam following the transabdominal exam to visualize the ovaries and endometrium. COMPARISON:  01/23/2017 FINDINGS: Uterus  Measurements: 4.1 x 2.5 x 3.7 cm. No fibroids or other mass visualized. Endometrium Thickness: Not well evaluated but appears to measure 2 mm. No focal abnormality visualized. Right ovary Not visualized Left ovary Not visualized Other findings No abnormal free fluid. IMPRESSION: 1. Endometrium not will the evaluated but does not appear thickened. 2. Uterus otherwise unremarkable 3. Bilateral ovaries not visualized. Electronically Signed   By: Harmon Pier M.D.   On: 08/03/2017 23:14    Procedures Procedures (including critical care time)  Medications Ordered in ED Medications  cephALEXin (KEFLEX) capsule 500 mg (not administered)  Initial Impression / Assessment and Plan / ED Course  I have reviewed the triage vital signs and the nursing notes.  Pertinent labs & imaging results that were available during my care of the patient were reviewed by me and considered in my medical decision making (see chart for details).     Patient presents brought in from assisted living facility for evaluation of acute onset of vaginal bleeding found in her adult diaper.  Afebrile, vital signs are stable.  She is nontoxic in appearance.  She is at her mental status baseline.  She was accompanied by her daughter throughout the visit.  On examination there does appear to be frank vaginal bleeding with no evidence of trauma. There is some bleeding around the rectum which appears to be tracking downward from the vagina however no blood with stool and I doubt GI bleed.  She has no leukocytosis, leukopenia, or anemia.  Remainder of lab work is at patient's baseline. 9:55 PM Spoke with Dr. Emelda Fear with OB/GYN who recommends obtaining transvaginal ultrasound.  If endometrium is greater than 4 mm in thickness, he recommends endometrial biopsy on an outpatient basis. 12:05AM- Ultrasound shows uterus measuring 4.1 x 2.5 x 3.7 cm, which is thickened as compared to ultrasound which she underwent 6 months ago (3.6 x 1.9 x  2.7 cm).  She also appears to have a concurrent UTI, sent for culture.  First dose Keflex given in the ED.  I doubt other acute abdominal pathology.  Serial abdominal examinations remain benign.  She does require outpatient uterine biopsy for rule out of neoplasm.  Discussed findings with patient and patient's daughter who verbalized understanding of and agreement with this plan.  Discussed strict ED return precautions.  Patient in no apparent distress and hemodynamically stable for discharge at this time.   Final Clinical Impressions(s) / ED Diagnoses   Final diagnoses:  Vaginal bleeding  Acute cystitis with hematuria    ED Discharge Orders        Ordered    cephALEXin (KEFLEX) 500 MG capsule  3 times daily     08/04/17 0027       Jeanie Sewer, PA-C 08/04/17 0055    Marily Memos, MD 08/07/17 1118

## 2017-08-04 DIAGNOSIS — R319 Hematuria, unspecified: Secondary | ICD-10-CM | POA: Diagnosis not present

## 2017-08-04 LAB — URINALYSIS, ROUTINE W REFLEX MICROSCOPIC
Bilirubin Urine: NEGATIVE
Glucose, UA: NEGATIVE mg/dL
Ketones, ur: NEGATIVE mg/dL
Nitrite: POSITIVE — AB
Protein, ur: NEGATIVE mg/dL
Specific Gravity, Urine: 1.015 (ref 1.005–1.030)
pH: 6 (ref 5.0–8.0)

## 2017-08-04 MED ORDER — CEPHALEXIN 500 MG PO CAPS: 500.0000 mg | ORAL_CAPSULE | Freq: Three times a day (TID) | ORAL | 0 refills | Status: AC

## 2017-08-04 MED ORDER — CEPHALEXIN 500 MG PO CAPS
500.0000 mg | ORAL_CAPSULE | Freq: Once | ORAL | Status: AC
  Administered 2017-08-04: 500 mg via ORAL
  Filled 2017-08-04: qty 1

## 2017-08-04 NOTE — Discharge Instructions (Signed)
Please take all of your antibiotics until finished!   You may develop abdominal discomfort or diarrhea from the antibiotic.  You may help offset this with probiotics which you can buy or get in yogurt. Do not eat  or take the probiotics until 2 hours after your antibiotic.   [facility] of your abnormal vaginal bleeding.  You will likely eventually undergo  an endometrial biopsy where a sample of tissue is sent off for testing.  Return to the emergency department if any concerning signs or symptoms develop.

## 2017-08-06 LAB — URINE CULTURE: Culture: >=100000 — AB

## 2017-08-07 ENCOUNTER — Telehealth: Payer: Self-pay | Admitting: *Deleted

## 2017-08-07 NOTE — Telephone Encounter (Signed)
Post ED Visit - Positive Culture Follow-up  Culture report reviewed by antimicrobial stewardship pharmacist:  []  Enzo BiNathan Batchelder, Pharm.D. []  Celedonio MiyamotoJeremy Frens, Pharm.D., BCPS AQ-ID [x]  Garvin FilaMike Maccia, Pharm.D., BCPS []  Georgina PillionElizabeth Martin, [address]Pharm.D., BCPS []  [place] Pham, [place]arm.D., BCPS, AAHIVP []  Estella HuskMichelle Turner, Pharm.D., BCPS, AAHIVP []  Lysle Pearlachel Rumbarger, PharmD, BCPS []  Blake DivineShannon Parkey, PharmD []  Pollyann SamplesAndy Johnston, PharmD, BCPS  Positive urine culture Treated with Cephalexin, organism sensitive to the same and no further patient follow-up is required at this time.  Virl AxeRobertson, Afnan Emberton [facility] 08/07/2017, 10:03 AM

## 2017-08-08 ENCOUNTER — Ambulatory Visit (INDEPENDENT_AMBULATORY_CARE_PROVIDER_SITE_OTHER): Payer: Medicare Other

## 2017-08-08 ENCOUNTER — Encounter: Payer: Self-pay | Admitting: Internal Medicine

## 2017-08-08 ENCOUNTER — Ambulatory Visit (INDEPENDENT_AMBULATORY_CARE_PROVIDER_SITE_OTHER): Payer: Medicare Other | Admitting: Internal Medicine

## 2017-08-08 VITALS — BP 154/68 | HR 53 | Temp 98.1°F | Ht 60.0 in

## 2017-08-08 VITALS — BP 150/70 | HR 53 | Temp 98.1°F | Ht 60.0 in

## 2017-08-08 DIAGNOSIS — F0150 Vascular dementia without behavioral disturbance: Secondary | ICD-10-CM | POA: Diagnosis not present

## 2017-08-08 DIAGNOSIS — R1310 Dysphagia, unspecified: Secondary | ICD-10-CM | POA: Diagnosis not present

## 2017-08-08 DIAGNOSIS — G309 Alzheimer's disease, unspecified: Secondary | ICD-10-CM | POA: Diagnosis not present

## 2017-08-08 DIAGNOSIS — Z0000 Encounter for general adult medical examination without abnormal findings: Secondary | ICD-10-CM | POA: Diagnosis not present

## 2017-08-08 DIAGNOSIS — I10 Essential (primary) hypertension: Secondary | ICD-10-CM

## 2017-08-08 DIAGNOSIS — J302 Other seasonal allergic rhinitis: Secondary | ICD-10-CM

## 2017-08-08 DIAGNOSIS — N950 Postmenopausal bleeding: Secondary | ICD-10-CM

## 2017-08-08 DIAGNOSIS — F329 Major depressive disorder, single episode, unspecified: Secondary | ICD-10-CM

## 2017-08-08 DIAGNOSIS — F0280 Dementia in other diseases classified elsewhere without behavioral disturbance: Secondary | ICD-10-CM | POA: Diagnosis not present

## 2017-08-08 DIAGNOSIS — Z7189 Other specified counseling: Secondary | ICD-10-CM | POA: Diagnosis not present

## 2017-08-08 DIAGNOSIS — F0153 Vascular dementia, unspecified severity, with mood disturbance: Secondary | ICD-10-CM

## 2017-08-08 NOTE — Progress Notes (Signed)
Subjective:   Beverly Barnett is a 84 y.o. female who presents for Medicare Annual (Subsequent) preventive examination.  Last AWV-07/19/2016       Objective:     Vitals: There were no vitals taken for this visit.  There is no height or weight on file to calculate BMI.  Advanced Directives 08/03/2017 04/09/2017 02/26/2017 01/31/2017 01/22/2017 01/07/2017 12/17/2016  Does Patient Have a Medical Advance Directive? Yes Yes Yes No;Yes No - Yes  Type of Advance Directive Out of facility DNR (pink MOST or yellow form) Out of facility DNR (pink MOST or yellow form) Healthcare Power of [place];Living will;Out of facility DNR (pink MOST or yellow form) Out of facility DNR (pink MOST or yellow form) - Out of facility DNR (pink MOST or yellow form) Healthcare Power of [place];Living will  Does patient want to make changes to medical advance directive? - - No - Patient declined - - - No - Patient declined  Copy of Healthcare Power of Attorney in Chart? - - - - - - No - copy requested  Would patient like information on creating a medical advance directive? - - - - - - -  Pre-existing out of facility DNR order (yellow form or pink MOST form) Yellow form placed in chart (order not valid for inpatient use);Pink MOST form placed in chart (order not valid for inpatient use) - - - - Yellow form placed in chart (order not valid for inpatient use) -    Tobacco Social History   Tobacco Use  Smoking Status Never Smoker  Smokeless Tobacco Never Used     Counseling given: Not Answered   Clinical Intake:                       Past Medical History:  Diagnosis Date  . Cardiac catheterisation as the cause of abnormal reaction of patient, or of later complication 10/05/2006  . Dysphagia, unspecified(787.20)   . Encounter for long-term (current) use of other medications   . High cholesterol   . Hyperosmolality and/or hypernatremia   . Hypertension   . Osteoporosis   . Other atopic dermatitis and  related conditions   . Type II or unspecified type diabetes mellitus without mention of complication, not stated as uncontrolled   . Vascular dementia 06/22/2013   Past Surgical History:  Procedure Laterality Date  . FEMUR IM NAIL  07/24/2011   Procedure: INTRAMEDULLARY (IM) NAIL FEMORAL;  Surgeon: Javier Docker, MD;  Location: [facility];  Service: Orthopedics;  Laterality: Right;  Biomet, c-arm, Jackson table  . FEMUR SURGERY Right 03/2010  . FRACTURE SURGERY Left    Hip, Leg and shoulder  . INTRAMEDULLARY (IM) NAIL INTERTROCHANTERIC Left 07/30/2013   Procedure: INTRAMEDULLARY (IM) NAIL INTERTROCHANTRIC LEFT;  Surgeon: Shelda Pal, MD;  Location: [facility];  Service: Orthopedics;  Laterality: Left;  . SHOULDER SURGERY Left 03/2010   Family History  Problem Relation Age of Onset  . Cancer Sister   . Heart disease Father    Social History   Socioeconomic History  . Marital status: Widowed    Spouse name: Not on file  . Number of children: Not on file  . Years of education: Not on file  . Highest education level: Not on file  Social Needs  . Financial resource strain: Not on file  . Food insecurity - worry: Not on file  . Food insecurity - inability: Not on file  . Transportation needs - medical: Not on  file  . Transportation needs - non-medical: Not on file  Occupational History  . Not on file  Tobacco Use  . Smoking status: Never Smoker  . Smokeless tobacco: Never Used  Substance and Sexual Activity  . Alcohol use: Yes    Alcohol/week: 0.6 oz    Types: 1 Glasses of wine per week    Comment: occasionally  . Drug use: No  . Sexual activity: Not Currently  Other Topics Concern  . Not on file  Social History Narrative  . Not on file    Outpatient Encounter Medications as of 08/08/2017  Medication Sig  . acetaminophen (TYLENOL) 500 MG tablet Take 1 tablet (500 mg total) by mouth 3 (three) times daily. For one week, then tid prn thereafter  . aspirin EC 81 MG tablet Take 1  tablet (81 mg total) by mouth daily.  . cephALEXin (KEFLEX) 500 MG capsule Take 1 capsule (500 mg total) by mouth 3 (three) times daily for 7 days.  . cetirizine (ZYRTEC) 5 MG tablet Take 1 tablet (5 mg total) by mouth daily.  . Coenzyme Q10 (CO Q 10) 100 MG CAPS Take 1 tablet by mouth 2 (two) times daily.  . colestipol (COLESTID) 1 G tablet Take 1 tablet (1 g total) by mouth daily.  Marland Kitchen donepezil (ARICEPT) 10 MG tablet Take 10 mg by mouth at bedtime.  Marland Kitchen losartan (COZAAR) 100 MG tablet Take 100 mg by mouth daily.  . Melatonin 3 MG CAPS Take 1 capsule (3 mg total) by mouth at bedtime and may repeat dose one time if needed.  . metoprolol tartrate (LOPRESSOR) 25 MG tablet Take 25 mg by mouth 2 (two) times daily.  . Multiple Vitamin (MULITIVITAMIN WITH MINERALS) TABS Take 1 tablet by mouth daily.   Marland Kitchen saccharomyces boulardii (FLORASTOR) 250 MG capsule Take 250 mg by mouth 2 (two) times daily.  . Triamcinolone Acetonide (TRIAMCINOLONE 0.1 % CREAM : EUCERIN) CREA Apply 1 application topically daily as needed for rash (on face).  Marland Kitchen VAYACOG 100-19.5-6.5 MG CAPS TAKE 1 CAPSULE EVERY DAY.   No facility-administered encounter medications on file as of 08/08/2017.     Activities of Daily Living No flowsheet data found.  Patient Care Team: Beverly Boys, DO as PCP - General (Internal Medicine)    Assessment:   This is a routine wellness examination for Beverly Barnett.  Exercise Activities and Dietary recommendations    Goals    . Weight (lb) < 126 lb (57.2 kg)     Starting 07/19/16, I will attempt to decrease my weight to get to my goal weight of 126 lb.        Fall Risk Fall Risk  04/09/2017 12/17/2016 10/21/2016 07/19/2016 05/17/2016  Falls in the past year? No Yes Yes Yes Yes  Number falls in past yr: - 2 or more 1 2 or more 2 or more  Comment - - - Pt has had multiple falls over the past year; most of her falls, she was trying to walk (some without her walker) Pt does have PT/OT but it is not working very  well.  -  Injury with Fall? - Yes No Yes Yes  Comment - - - - -  Risk Factor Category  - - - High Fall Risk -  Risk for fall due to : - - - Impaired mobility;History of fall(s);Impaired balance/gait -  Follow up - - - Falls prevention discussed -   Is the patient's home free of loose throw rugs in walkways,  pet beds, electrical cords, etc?   yes      Grab bars in the bathroom? yes      Handrails on the stairs?   yes      Adequate lighting?   yes  Depression Screen PHQ 2/9 Scores 07/19/2016 03/03/2015 08/12/2014 07/11/2014  PHQ - 2 Score 1 0 0 0     Cognitive Function MMSE - Mini Mental State Exam 07/19/2016 10/13/2013 06/22/2013  Orientation to time 1 4 4   Orientation to Place 0 4 4  Registration 3 3 3   Attention/ Calculation 3 5 4   Recall 2 0 0  Language- name 2 objects 2 2 2   Language- repeat 1 1 1   Language- follow 3 step command 3 3 3   Language- read & follow direction 1 1 1   Write a sentence 0 1 1  Copy design 0 1 1  Total score 16 25 24         Immunization History  Administered Date(s) Administered  . Influenza, High Dose Seasonal PF 03/12/2017  . Influenza,inj,Quad PF,6+ Mos 02/23/2013, 06/27/2014, 03/03/2015  . Influenza-Unspecified 03/12/2016  . Pneumococcal Conjugate-13 07/19/2016  . Pneumococcal Polysaccharide-23 07/24/2011  . Zoster 02/19/2008    Qualifies for Shingles Vaccine? Yes, educated and will think about it  Screening Tests Health Maintenance  Topic Date Due  . TETANUS/TDAP  02/10/1943  . INFLUENZA VACCINE  Completed  . DEXA SCAN  Completed  . PNA vac Low Risk Adult  Completed    Cancer Screenings: Lung: Low Dose CT Chest recommended if Age 43-80 years, 30 pack-year currently smoking OR have quit w/in 15years. Patient does not qualify. Breast:  Up to date on Mammogram? Yes   Up to date of Bone Density/Dexa? Yes Colorectal: up to date  Additional Screenings:  Hepatitis C Screening: declined     Plan:    I have personally reviewed and addressed  the Medicare Annual Wellness questionnaire and have noted the following in the patient's chart:  A. Medical and social history B. Use of alcohol, tobacco or illicit drugs  C. Current medications and supplements D. Functional ability and status E.  Nutritional status F.  Physical activity G. Advance directives H. List of other physicians I.  Hospitalizations, surgeries, and ER visits in previous 12 months J.  Vitals K. Screenings to include hearing, vision, cognitive, depression L. Referrals and appointments - none  In addition, I have reviewed and discussed with patient certain preventive protocols, quality metrics, and best practice recommendations. A written personalized care plan for preventive services as well as general preventive health recommendations were provided to patient.  See attached scanned questionnaire for additional information.   Signed,   Tyron RussellSara Sencere Symonette, RN Nurse Health Advisor  Patient Concerns: Runny nose every day, would like something other than zyrtec

## 2017-08-08 NOTE — Patient Instructions (Signed)
[name] , Thank you for taking time to come for your Medicare Wellness Visit. I appreciate your ongoing commitment to your health goals. Please review the following plan we discussed and let me know if I can assist you in the future.   Screening recommendations/referrals: Colonoscopy excluded, you are over age 82 Mammogram excluded, you are over age 82 Bone Density up to date Recommended yearly ophthalmology/optometry visit for glaucoma screening and checkup Recommended yearly dental visit for hygiene and checkup  Vaccinations: Influenza vaccine up to date, due 2019 fall season Pneumococcal vaccine up to date Tdap vaccine due Shingles vaccine due    Advanced directives: in chart  Conditions/risks identified: none  Next appointment: Beverly RussellSara Ercia Crisafulli, RN 08/12/2018 @ 10:45am   Preventive Care 65 Years and Older, Female Preventive care refers to lifestyle choices and visits with your health care provider that can promote health and wellness. What does preventive care include?  A yearly physical exam. This is also called an annual well check.  Dental exams once or twice a year.  Routine eye exams. Ask your health care provider how often you should have your eyes checked.  Personal lifestyle choices, including:  Daily care of your teeth and gums.  Regular physical activity.  Eating a healthy diet.  Avoiding tobacco and drug use.  Limiting alcohol use.  Practicing safe sex.  Taking low-dose aspirin every day.  Taking vitamin and mineral supplements as recommended by your health care provider. What happens during an annual well check? The services and screenings done by your health care provider during your annual well check will depend on your age, overall health, lifestyle risk factors, and family history of disease. Counseling  Your health care provider may ask you questions about your:  Alcohol use.  Tobacco use.  Drug use.  Emotional well-being.  Home and  relationship well-being.  Sexual activity.  Eating habits.  History of falls.  Memory and ability to understand (cognition).  Work and work Astronomerenvironment.  Reproductive health. Screening  You may have the following tests or measurements:  Height, weight, and BMI.  Blood pressure.  Lipid and cholesterol levels. These may be checked every 5 years, or more frequently if you are over 82 years old.  Skin check.  Lung cancer screening. You may have this screening every year starting at age 82 if you have a 30-pack-year history of smoking and currently smoke or have quit within the past 15 years.  Fecal occult blood test (FOBT) of the stool. You may have this test every year starting at age 82.  Flexible sigmoidoscopy or colonoscopy. You may have a sigmoidoscopy every 5 years or a colonoscopy every 10 years starting at age 82.  Hepatitis C blood test.  Hepatitis B blood test.  Sexually transmitted disease (STD) testing.  Diabetes screening. This is done by checking your blood sugar (glucose) after you have not eaten for a while (fasting). You may have this done every 1-3 years.  Bone density scan. This is done to screen for osteoporosis. You may have this done starting at age 82.  Mammogram. This may be done every 1-2 years. Talk to your health care provider about how often you should have regular mammograms. Talk with your health care provider about your test results, treatment options, and if necessary, the need for more tests. Vaccines  Your health care provider may recommend certain vaccines, such as:  Influenza vaccine. This is recommended every year.  Tetanus, diphtheria, and acellular pertussis (Tdap, Td) vaccine.  You may need a Td booster every 10 years.  Zoster vaccine. You may need this after age 24.  Pneumococcal 13-valent conjugate (PCV13) vaccine. One dose is recommended after age 42.  Pneumococcal polysaccharide (PPSV23) vaccine. One dose is recommended after  age 68. Talk to your health care provider about which screenings and vaccines you need and how often you need them. This information is not intended to replace advice given to you by your health care provider. Make sure you discuss any questions you have with your health care provider. Document Released: 06/23/2015 Document Revised: 02/14/2016 Document Reviewed: 03/28/2015 Elsevier Interactive Patient Education  2017 [place] Prevention in the Home Falls can cause injuries. They can happen to people of all ages. There are many things you can do to make your home safe and to help prevent falls. What can I do on the outside of my home?  Regularly fix the edges of walkways and driveways and fix any cracks.  Remove anything that might make you trip as you walk through a door, such as a raised step or threshold.  Trim any bushes or trees on the path to your home.  Use bright outdoor lighting.  Clear any walking paths of anything that might make someone trip, such as rocks or tools.  Regularly check to see if handrails are loose or broken. Make sure that both sides of any steps have handrails.  Any raised decks and porches should have guardrails on the edges.  Have any leaves, snow, or ice cleared regularly.  Use sand or salt on walking paths during winter.  Clean up any spills in your garage right away. This includes oil or grease spills. What can I do in the bathroom?  Use night lights.  Install grab bars by the toilet and in the tub and shower. Do not use towel bars as grab bars.  Use non-skid mats or decals in the tub or shower.  If you need to sit down in the shower, use a plastic, non-slip stool.  Keep the floor dry. Clean up any water that spills on the floor as soon as it happens.  Remove soap buildup in the tub or shower regularly.  Attach bath mats securely with double-sided non-slip rug tape.  Do not have throw rugs and other things on the floor that can  make you trip. What can I do in the bedroom?  Use night lights.  Make sure that you have a light by your bed that is easy to reach.  Do not use any sheets or blankets that are too big for your bed. They should not hang down onto the floor.  Have a firm chair that has side arms. You can use this for support while you get dressed.  Do not have throw rugs and other things on the floor that can make you trip. What can I do in the kitchen?  Clean up any spills right away.  Avoid walking on wet floors.  Keep items that you use a lot in easy-to-reach places.  If you need to reach something above you, use a strong step stool that has a grab bar.  Keep electrical cords out of the way.  Do not use floor polish or wax that makes floors slippery. If you must use wax, use non-skid floor wax.  Do not have throw rugs and other things on the floor that can make you trip. What can I do with my stairs?  Do not leave any  items on the stairs.  Make sure that there are handrails on both sides of the stairs and use them. Fix handrails that are broken or loose. Make sure that handrails are as long as the stairways.  Check any carpeting to make sure that it is firmly attached to the stairs. Fix any carpet that is loose or worn.  Avoid having throw rugs at the top or bottom of the stairs. If you do have throw rugs, attach them to the floor with carpet tape.  Make sure that you have a light switch at the top of the stairs and the bottom of the stairs. If you do not have them, ask someone to add them for you. What else can I do to help prevent falls?  Wear shoes that:  Do not have high heels.  Have rubber bottoms.  Are comfortable and fit you well.  Are closed at the toe. Do not wear sandals.  If you use a stepladder:  Make sure that it is fully opened. Do not climb a closed stepladder.  Make sure that both sides of the stepladder are locked into place.  Ask someone to hold it for you,  if possible.  Clearly mark and make sure that you can see:  Any grab bars or handrails.  First and last steps.  Where the edge of each step is.  Use tools that help you move around (mobility aids) if they are needed. These include:  Canes.  Walkers.  Scooters.  Crutches.  Turn on the lights when you go into a dark area. Replace any light bulbs as soon as they burn out.  Set up your furniture so you have a clear path. Avoid moving your furniture around.  If any of your floors are uneven, fix them.  If there are any pets around you, be aware of where they are.  Review your medicines with your doctor. Some medicines can make you feel dizzy. This can increase your chance of falling. Ask your doctor what other things that you can do to help prevent falls. This information is not intended to replace advice given to you by your health care provider. Make sure you discuss any questions you have with your health care provider. Document Released: 03/23/2009 Document Revised: 11/02/2015 Document Reviewed: 07/01/2014 Elsevier Interactive Patient Education  2017 [employer].

## 2017-08-08 NOTE — Patient Instructions (Addendum)
STOP CETIRIZINE, DONEPEZIL, CONENZYME Q10, VAYACOG (PHOS-DHA-EPA), AND CETIRIZINE  START FEXOFENADINE 180MG  DAILY FOR SEASONAL ALLERGY  Continue other medications as ordered  Will call with referral to Palliative care  DNR/MOST FORM COMPLETED  Follow up in 3 mos for dementia, HTN, postmenopausal bleeding

## 2017-08-08 NOTE — Progress Notes (Signed)
Patient ID: Beverly Barnett, female   DOB: 1923-07-29, 84 y.o.   MRN: [id number]   Location:  [facility] OFFICE  Provider: DR Elmon Kirschner  Code Status: DNR Goals of Care:  Advanced Directives 08/08/2017  Does Patient Have a Medical Advance Directive? Yes  Type of Advance Directive Out of facility DNR (pink MOST or yellow form)  Does patient want to make changes to medical advance directive? No - Patient declined  Copy of Healthcare Power of Attorney in Chart? -  Would patient like information on creating a medical advance directive? -  Pre-existing out of facility DNR order (yellow form or pink MOST form) Yellow form placed in chart (order not valid for inpatient use)     Chief Complaint  Patient presents with  . Medical Management of Chronic Issues    routine follow-up    HPI: Patient is a 84 y.o. female seen today for medical management of chronic diseases.  Pt reports that she is ready to die and cannot understand "what my purpose is". She was seen in the ED on 08/03/17 for acute cystitis and vaginal bleeding. Transvaginal US revealed endometrium 2mm thick with no other acute process. ER recommended o/p endometrial bx.  She is a poor historian due to dementia. Hx obtained from chart  Dementia -worse. MMSE score 9/30 (prev 16/30 in Feb 2018). She takes melatonin to help sleep. She takes vayacog and aricept daily. She is mostly w/c bound. Daughter reports she can no longer feed herself and requires assistance to eat. She has dysphagia. No loss of appetite.  HTN - BP stable on losartan and metoprolol.   Unsteady gait/left hemiparesis - unchanged. No longer gets PT. She cannot remember to do home exercises. She attends exercise daily. She is w/c bound  Hyperlipidemia - stable on colestipol. LDL 138; Total chol 227; TG 218; HDL 50  Rash - unchanged on triamcinolone cream and hydrocortisone cream  Constipation - stable on floraster  Past Medical History:  Diagnosis Date  . Cardiac  catheterisation as the cause of abnormal reaction of patient, or of later complication 10/05/2006  . Dysphagia, unspecified(787.20)   . Encounter for long-term (current) use of other medications   . High cholesterol   . Hyperosmolality and/or hypernatremia   . Hypertension   . Osteoporosis   . Other atopic dermatitis and related conditions   . Type II or unspecified type diabetes mellitus without mention of complication, not stated as uncontrolled   . Vascular dementia 06/22/2013    Past Surgical History:  Procedure Laterality Date  . FEMUR IM NAIL  07/24/2011   Procedure: INTRAMEDULLARY (IM) NAIL FEMORAL;  Surgeon: Javier Docker, MD;  Location: [facility] ORS;  Service: Orthopedics;  Laterality: Right;  Biomet, c-arm, Jackson table  . FEMUR SURGERY Right 03/2010  . FRACTURE SURGERY Left    Hip, Leg and shoulder  . INTRAMEDULLARY (IM) NAIL INTERTROCHANTERIC Left 07/30/2013   Procedure: INTRAMEDULLARY (IM) NAIL INTERTROCHANTRIC LEFT;  Surgeon: Shelda Pal, MD;  Location: [facility]S;  Service: Orthopedics;  Laterality: Left;  . SHOULDER SURGERY Left 03/2010     reports that  has never smoked. she has never used smokeless tobacco. She reports that she drinks about 0.6 oz of alcohol per week. She reports that she does not use drugs. Social History   Socioeconomic History  . Marital status: Widowed    Spouse name: Not on file  . Number of children: Not on file  . Years of education: Not on file  .  Highest education level: Not on file  Social Needs  . Financial resource strain: Not hard at all  . Food insecurity - worry: Never true  . Food insecurity - inability: Never true  . Transportation needs - medical: No  . Transportation needs - non-medical: No  Occupational History  . Not on file  Tobacco Use  . Smoking status: Never Smoker  . Smokeless tobacco: Never Used  Substance and Sexual Activity  . Alcohol use: Yes    Alcohol/week: 0.6 oz    Types: 1 Glasses of wine per week     Comment: occasionally  . Drug use: No  . Sexual activity: Not Currently  Other Topics Concern  . Not on file  Social History Narrative  . Not on file    Family History  Problem Relation Age of Onset  . Cancer Sister   . Heart disease Father     No Known Allergies  Outpatient Encounter Medications as of 08/08/2017  Medication Sig  . acetaminophen (TYLENOL) 500 MG tablet Take 1 tablet (500 mg total) by mouth 3 (three) times daily. For one week, then tid prn thereafter  . aspirin EC 81 MG tablet Take 1 tablet (81 mg total) by mouth daily.  . cephALEXin (KEFLEX) 500 MG capsule Take 1 capsule (500 mg total) by mouth 3 (three) times daily for 7 days.  . cetirizine (ZYRTEC) 5 MG tablet Take 1 tablet (5 mg total) by mouth daily.  . Coenzyme Q10 (CO Q 10) 100 MG CAPS Take 1 tablet by mouth 2 (two) times daily.  . colestipol (COLESTID) 1 G tablet Take 1 tablet (1 g total) by mouth daily.  Marland Kitchen donepezil (ARICEPT) 10 MG tablet Take 10 mg by mouth at bedtime.  Marland Kitchen losartan (COZAAR) 100 MG tablet Take 100 mg by mouth daily.  . Melatonin 3 MG CAPS Take 1 capsule (3 mg total) by mouth at bedtime and may repeat dose one time if needed.  . metoprolol tartrate (LOPRESSOR) 25 MG tablet Take 25 mg by mouth 2 (two) times daily.  . Multiple Vitamin (MULITIVITAMIN WITH MINERALS) TABS Take 1 tablet by mouth daily.   Marland Kitchen saccharomyces boulardii (FLORASTOR) 250 MG capsule Take 250 mg by mouth 2 (two) times daily.  . Triamcinolone Acetonide (TRIAMCINOLONE 0.1 % CREAM : EUCERIN) CREA Apply 1 application topically daily as needed for rash (on face).  Marland Kitchen VAYACOG 100-19.5-6.5 MG CAPS TAKE 1 CAPSULE EVERY DAY.   No facility-administered encounter medications on file as of 08/08/2017.     Review of Systems:  Review of Systems  Unable to perform ROS: Dementia    Health Maintenance  Topic Date Due  . TETANUS/TDAP  02/10/1943  . INFLUENZA VACCINE  Completed  . DEXA SCAN  Completed  . PNA vac Low Risk Adult  Completed     Physical Exam: Vitals:   08/08/17 1106  BP: (!) 154/68  Pulse: (!) 53  Temp: 98.1 F (36.7 C)  TempSrc: Oral  SpO2: 93%  Height: 5' (1.524 m)   Body mass index is 25.78 kg/m. Physical Exam  Constitutional: She appears well-developed and well-nourished.  Frail appearing in NAD, sitting in w/c  HENT:  Mouth/Throat: Oropharynx is clear and moist. No oropharyngeal exudate.  MMM; no oral thrush  Eyes: Pupils are equal, round, and reactive to light. No scleral icterus.  Neck: Neck supple. Muscular tenderness present. Carotid bruit is not present. Decreased range of motion present. No tracheal deviation present. No thyromegaly present.  Cardiovascular: Normal rate,  regular rhythm and intact distal pulses. Exam reveals no gallop and no friction rub.  Murmur (1/6 SEM) heard. No LE edema b/l. no calf TTP.   Pulmonary/Chest: Effort normal and breath sounds normal. No stridor. No respiratory distress. She has no wheezes. She has no rales.  Abdominal: Soft. Normal appearance and bowel sounds are normal. She exhibits no distension and no mass. There is no hepatomegaly. There is no tenderness. There is no rigidity, no rebound and no guarding. No hernia.  Musculoskeletal: She exhibits edema and deformity (thoracic kyphosis; neck contracture to right).  Lymphadenopathy:    She has no cervical adenopathy.  Neurological: She is alert. Gait (she is w/c bound) abnormal.  Skin: Skin is warm and dry. Rash (patchy dry on ACW and extremities) noted.  Psychiatric: Her behavior is normal. Thought content normal. Cognition and memory are impaired. She exhibits a depressed mood.    Labs reviewed: Basic Metabolic Panel: Recent Labs    01/23/17 0011 01/31/17 2033 08/03/17 2102  NA 140 140 143  K 4.8 3.8 4.3  CL 108 108 108  CO2 24 25 23   GLUCOSE 106* 102* 117*  BUN 28* 30* 32*  CREATININE 1.07* 1.20* 1.17*  CALCIUM 9.0 8.6* 9.3   Liver Function Tests: Recent Labs    01/07/17 1126  01/23/17 0011  AST 16 23  ALT 10 13*  ALKPHOS 65 62  BILITOT 0.6 0.4  PROT 6.3 6.9  ALBUMIN 3.6 3.7   Recent Labs    01/23/17 0011  LIPASE 57*   No results for input(s): AMMONIA in the last 8760 hours. CBC: Recent Labs    01/31/17 2033 02/26/17 1440 08/03/17 2102  WBC 13.7* 7.8 8.8  NEUTROABS 9.0* 2,980 4.4  HGB 11.8* 12.1 12.8  HCT 35.4* 37.0 39.0  MCV 91.5 91.1 94.2  PLT 178 202 190   Lipid Panel: Recent Labs    01/07/17 1126  CHOL 231*  HDL 42*  LDLCALC 151*  TRIG 192*  CHOLHDL 5.5*   Lab Results  Component Value Date   HGBA1C 5.5 01/07/2017    Procedures since last visit: US Pelvis Transvanginal Non-ob (tv Only)  Result Date: 08/03/2017 CLINICAL DATA:  84 year old female with vaginal bleeding. EXAM: TRANSABDOMINAL AND TRANSVAGINAL ULTRASOUND OF PELVIS TECHNIQUE: Both transabdominal and transvaginal ultrasound examinations of the pelvis were performed. Transabdominal technique was performed for global imaging of the pelvis including uterus, ovaries, adnexal regions, and pelvic cul-de-sac. It was necessary to proceed with endovaginal exam following the transabdominal exam to visualize the ovaries and endometrium. COMPARISON:  01/23/2017 FINDINGS: Uterus Measurements: 4.1 x 2.5 x 3.7 cm. No fibroids or other mass visualized. Endometrium Thickness: Not well evaluated but appears to measure 2 mm. No focal abnormality visualized. Right ovary Not visualized Left ovary Not visualized Other findings No abnormal free fluid. IMPRESSION: 1. Endometrium not will the evaluated but does not appear thickened. 2. Uterus otherwise unremarkable 3. Bilateral ovaries not visualized. Electronically Signed   By: Harmon Pier M.D.   On: 08/03/2017 23:14   US Pelvis Complete  Result Date: 08/03/2017 CLINICAL DATA:  84 year old female with vaginal bleeding. EXAM: TRANSABDOMINAL AND TRANSVAGINAL ULTRASOUND OF PELVIS TECHNIQUE: Both transabdominal and transvaginal ultrasound examinations of  the pelvis were performed. Transabdominal technique was performed for global imaging of the pelvis including uterus, ovaries, adnexal regions, and pelvic cul-de-sac. It was necessary to proceed with endovaginal exam following the transabdominal exam to visualize the ovaries and endometrium. COMPARISON:  01/23/2017 FINDINGS: Uterus Measurements: 4.1 x 2.5 x  3.7 cm. No fibroids or other mass visualized. Endometrium Thickness: Not well evaluated but appears to measure 2 mm. No focal abnormality visualized. Right ovary Not visualized Left ovary Not visualized Other findings No abnormal free fluid. IMPRESSION: 1. Endometrium not will the evaluated but does not appear thickened. 2. Uterus otherwise unremarkable 3. Bilateral ovaries not visualized. Electronically Signed   By: Harmon PierJeffrey  Hu M.D.   On: 08/03/2017 23:14    Assessment/Plan     ICD-10-CM   1. 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 Postmenopausal bleeding N95.0 Amb Referral to Palliative Care  3. Essential hypertension I10 fexofenadine (ALLEGRA) 180 MG tablet  4. Seasonal allergic rhinitis, unspecified trigger J30.2 fexofenadine (ALLEGRA) 180 MG tablet  5. Dysphagia, unspecified type R13.10   6. Advanced care planning/counseling discussion Z71.89   7. Dementia, vascular, with depression F01.50    F32.9    Discussed vaginal bleeding - due to age and frialty, I do not recommend pursuing endometrial bx. Daughter agrees  STOP CETIRIZINE, DONEPEZIL, CONENZYME Q10, VAYACOG (PHOS-DHA-EPA), AND CETIRIZINE  START FEXOFENADINE 180MG  DAILY FOR SEASONAL ALLERGY  Continue other medications as ordered  Advanced directives discussed - DNR/MOST FORM COMPLETED; TIME SPENT >20 minutes. Prognosis is poor. She is likely a hospice candidate. Daughter [facility]CP[facility]) and son-in-law present along with pt.  Follow up in 3 mos for dementia, HTN, postmenopausal bleeding   Mabelle Mungin S.  Ancil Linseyarter, D. O., F. A. C. O. I.  [facility] [address] [place], [place] [address] [phone] Cell (Monday-Friday 8 AM - 5 PM) [phone] After 5 PM and follow prompts

## 2017-08-12 DIAGNOSIS — R531 Weakness: Secondary | ICD-10-CM | POA: Diagnosis not present

## 2017-09-15 DIAGNOSIS — R531 Weakness: Secondary | ICD-10-CM | POA: Diagnosis not present

## 2017-10-03 ENCOUNTER — Encounter: Payer: Self-pay | Admitting: Licensed Clinical Social Worker

## 2017-10-10 ENCOUNTER — Non-Acute Institutional Stay: Payer: Medicare Other | Admitting: Licensed Clinical Social Worker

## 2017-10-10 DIAGNOSIS — Z515 Encounter for palliative care: Secondary | ICD-10-CM | POA: Insufficient documentation

## 2017-10-10 NOTE — Progress Notes (Signed)
COMMUNITY PALLIATIVE CARE SW NOTE  PATIENT NAME: Beverly Barnett DOB: 1923/07/28 MRN: [id number]  PRIMARY CARE PROVIDER: [name], DO  RESPONSIBLE PARTY:  Acct ID - Guarantor Home Phone Work Phone Relationship Acct Type  [id number] - Beverly Barnett, Beverly Barnett [phone]  Self P/F     [address], [address], [place], [place] [address]     PLAN OF CARE and INTERVENTIONS:             1. GOALS OF CARE/ ADVANCE CARE PLANNING:  Beverly Barnett is significantly impacted by dementia and she says once during visit today that she is "ready to go" and is briefly tearful. She is comforted by hearing words that she will be cared for until her God calls her home which is the faith language that is meaningful to her. 2. SOCIAL/EMOTIONAL/SPIRITUAL ASSESSMENT/ INTERVENTIONS:  Beverly Barnett was in the dining room for lunch in her wheel chair when this social worker arrived. She is sitting with 3 other residents - well groomed and dressed in a shirt she said was "comfortable". She was smiling during visit and was observed to be making eye contact at times with others at her table. Due to dementia conversation is limited but when church is mentioned she visibly brightens and mentions that her son-in-law takes her to church and how "good" he is to her. She smiles when this statement is validated. Patient has no recall of this social worker from visit to visit and while she knows her daughter and son-in-law care for and about her she cannot recall details of her family nor her overall status. Today she was bright and said she knows that she is a "very lucky" woman. She was less tearful than during pervious visits. 3. PATIENT/CAREGIVER EDUCATION/ COPING: Not addressed today. 4. PERSONAL EMERGENCY PLAN:  Beverly Barnett is cared for at [facility] 5. COMMUNITY RESOURCES COORDINATION/ HEALTH CARE NAVIGATION:  No need for additional resources at this time. 6. FINANCIAL/LEGAL CONCERNS/INTERVENTIONS:  No concerns at this time.  Patient's daughter, Beverly Barnett, manages her finances and legal affairs.     SOCIAL HX:  Social History   Tobacco Use  . Smoking status: Never Smoker  . Smokeless tobacco: Never Used  Substance Use Topics  . Alcohol use: Yes    Alcohol/week: 0.6 oz    Types: 1 Glasses of wine per week    Comment: occasionally    CODE STATUS:   Code Status: Prior ADVANCED DIRECTIVES: N MOST FORM COMPLETE: Yes HOSPICE EDUCATION PROVIDED: None on this date - patient is unable to receive information. She is monitored to determine when she is hospice eligible.  PPS:Beverly Barnett is dependent for assistance with Activities of Daily Living. She does uses a wheel chair for most transport and a rollator for short distances in her room.       [employer], LCSW

## 2017-11-06 ENCOUNTER — Non-Acute Institutional Stay: Payer: Medicare Other | Admitting: Hospice and Palliative Medicine

## 2017-11-06 DIAGNOSIS — Z515 Encounter for palliative care: Secondary | ICD-10-CM

## 2017-11-07 ENCOUNTER — Telehealth: Payer: Self-pay | Admitting: *Deleted

## 2017-11-07 NOTE — Telephone Encounter (Signed)
AGREE - I will be the attending

## 2017-11-07 NOTE — Progress Notes (Signed)
PALLIATIVE CARE CONSULT VISIT   PATIENT NAME: Beverly Barnett DOB: 1924-03-31 MRN: [id number]  PRIMARY CARE PROVIDER: [name], DO  REFERRING PROVIDER: [name], DO [address] [place], [place] [address]  RESPONSIBLE PARTY:   Daughter   RECOMMENDATIONS and PLAN:  1. Weakness: secondary to advanced age, worsening memory and decreased strength to assist with standing or even holding her head up vertically. She requires total feeding by staff, but they report that it takes a really long time to feed her now. She aspirates on food that is not soft. It takes her a long time to swallow. She sleeps most of the time She remains WC bound but requires total assist with standing or transferring. I would recommend a hospital bed and the additional support from hospice. 2. ACP: DNR form on chart. Daughter in agreement with hospice services. Have reached out to Dr. Montez Morita. Awaiting a call back.   I spent 25 minutes providing this consultation,  from 4pm to 4:25pm. More than 50% of the time in this consultation was spent interviewing staff, reviewing records, assessing patient and coordinating communication.   HISTORY OF PRESENT ILLNESS:  Beverly Barnett is a 84 y.o.  female with multiple medical problems who resides in the Al setting at [facility].. Palliative Care was asked to help address symptom management needs and goals of care. Today's visit requested by staff due to a recent decline in function.  Staff think that she is more appropriate for Hospice services.  CODE STATUS: DNR  PPS: 30%% HOSPICE ELIGIBILITY/DIAGNOSIS: TBD  PAST MEDICAL HISTORY:  Past Medical History:  Diagnosis Date  . Cardiac catheterisation as the cause of abnormal reaction of patient, or of later complication 10/05/2006  . Dysphagia, unspecified(787.20)   . Encounter for long-term (current) use of other medications   . High cholesterol   . Hyperosmolality and/or hypernatremia   . Hypertension   .  Osteoporosis   . Other atopic dermatitis and related conditions   . Type II or unspecified type diabetes mellitus without mention of complication, not stated as uncontrolled   . Vascular dementia 06/22/2013    SOCIAL HX:  Social History   Tobacco Use  . Smoking status: Never Smoker  . Smokeless tobacco: Never Used  Substance Use Topics  . Alcohol use: Yes    Alcohol/week: 0.6 oz    Types: 1 Glasses of wine per week    Comment: occasionally    ALLERGIES: No Known Allergies   PERTINENT MEDICATIONS:  Outpatient Encounter Medications as of 11/06/2017  Medication Sig  . acetaminophen (TYLENOL) 500 MG tablet Take 1 tablet (500 mg total) by mouth 3 (three) times daily. For one week, then tid prn thereafter  . aspirin EC 81 MG tablet Take 1 tablet (81 mg total) by mouth daily.  . colestipol (COLESTID) 1 G tablet Take 1 tablet (1 g total) by mouth daily.  . fexofenadine (ALLEGRA) 180 MG tablet Take 1 tablet (180 mg total) by mouth daily.  Marland Kitchen losartan (COZAAR) 100 MG tablet Take 100 mg by mouth daily.  . Melatonin 3 MG CAPS Take 1 capsule (3 mg total) by mouth at bedtime and may repeat dose one time if needed.  . metoprolol tartrate (LOPRESSOR) 25 MG tablet Take 25 mg by mouth 2 (two) times daily.  . Multiple Vitamin (MULITIVITAMIN WITH MINERALS) TABS Take 1 tablet by mouth daily.   Marland Kitchen saccharomyces boulardii (FLORASTOR) 250 MG capsule Take 250 mg by mouth 2 (two) times daily.  Marland Kitchen  Triamcinolone Acetonide (TRIAMCINOLONE 0.1 % CREAM : EUCERIN) CREA Apply 1 application topically daily as needed for rash (on face).   No facility-administered encounter medications on file as of 11/06/2017.     PHYSICAL EXAM:   General: Deconditioned; unable to hold head up vertically Cardiovascular: Irreg rhythm; reg rate Pulmonary: diminished in bases; CTAB otherwise Abdomen: soft, active BS, NTTP Extremities: contracted hands; weak legs. Unable to stand without a lot of assistance Skin: thin,  fragile Neurological: alert ;+ memory loss; speaks in full sentences; repeats self. Takes a long time to be fed due to body habitus and weakness now; Sleeps a lot  Truett Perna, NP

## 2017-11-07 NOTE — Telephone Encounter (Signed)
Synetta Fail with Hospice notified.

## 2017-11-07 NOTE — Telephone Encounter (Signed)
Beverly BectonAnita Barn[facility] with [facility] called and stated that patient is ready for Hospice. Needs verbal order. Will you be the attending?  Please Advise.

## 2017-11-11 ENCOUNTER — Ambulatory Visit: Payer: Medicare Other | Admitting: Internal Medicine

## 2017-12-29 ENCOUNTER — Telehealth: Payer: Self-pay | Admitting: Internal Medicine

## 2017-12-29 NOTE — Telephone Encounter (Signed)
Received fax from [facility] about whether florastor and multivitamins could be discontinued due to family concerns about pill burden.  Will d/c these as well as baby aspirin.  Cetirizine, donepezil, coQ10, vayacog,already d/cd at last appt.  Fexofenadine was added for allergies at that visit also.  Med list updated.  Fax placed to be returned to hospice.

## 2018-01-28 ENCOUNTER — Encounter: Payer: Self-pay | Admitting: Internal Medicine

## 2018-03-06 ENCOUNTER — Other Ambulatory Visit: Payer: Self-pay

## 2018-03-06 MED ORDER — LORAZEPAM 0.5 MG PO TABS: ORAL_TABLET | ORAL | 0 refills | Status: AC

## 2018-08-11 ENCOUNTER — Telehealth: Payer: Self-pay | Admitting: *Deleted

## 2018-08-11 NOTE — Telephone Encounter (Signed)
Hard to answer considering we don't know more details.  I'd imagine she's not in the condition to come to an appointment or she would have.  Ok to approve recertification based on hospice MD's evaluation.  I'd like to request that hospice refill her medications going forward if she's not coming in to be seen.  If we are remaining attendings, we will need to change the chart over to me from Dr. Montez Morita.

## 2018-08-11 NOTE — Telephone Encounter (Signed)
Beverly Barnett with Hospice called wanting a recert on start of Care. Wants verbal order. Patient has not been seen since Dr. Montez Morita, a year ago, 08/08/2017. Please Advise.

## 2018-08-11 NOTE — Telephone Encounter (Signed)
Christina Notified. Stated that she will check on who would refill her Rx's.

## 2018-08-12 ENCOUNTER — Ambulatory Visit: Payer: Self-pay

## 2018-08-12 ENCOUNTER — Encounter: Payer: Medicare Other | Admitting: Family

## 2018-10-04 ENCOUNTER — Encounter: Payer: Self-pay | Admitting: Internal Medicine

## 2019-02-16 IMAGING — US US PELVIS COMPLETE
1 series · 14 of 25 positions shown · non-contrast
Comparison: None

CLINICAL DATA: Acute onset of postmenopausal vaginal bleeding.
Initial encounter.



[Series 1: us pelvis complete · 0.18mm/px · 14 of 68 slices shown]
[im 1/68]
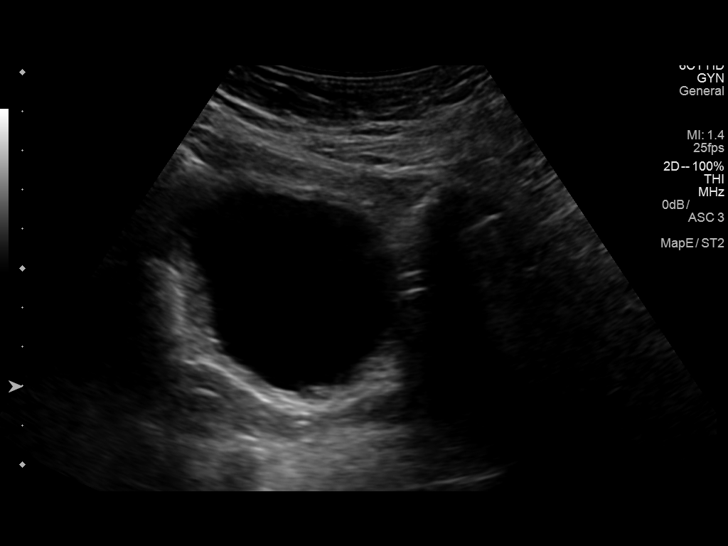
[im 6/68]
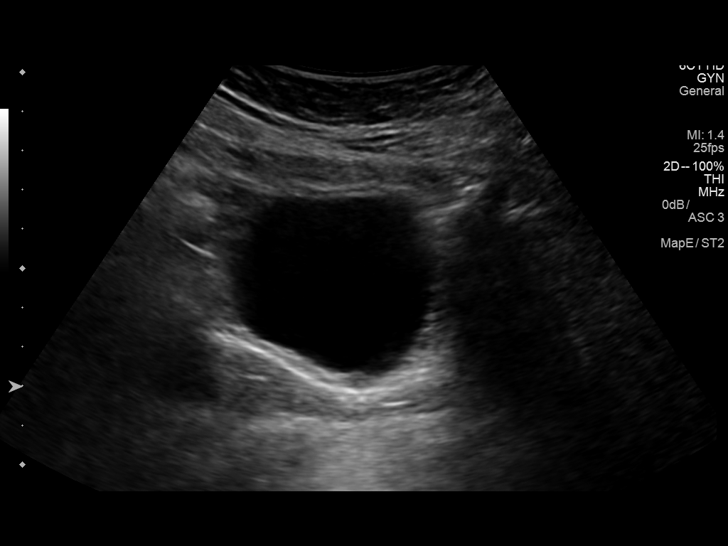
[im 12/68]
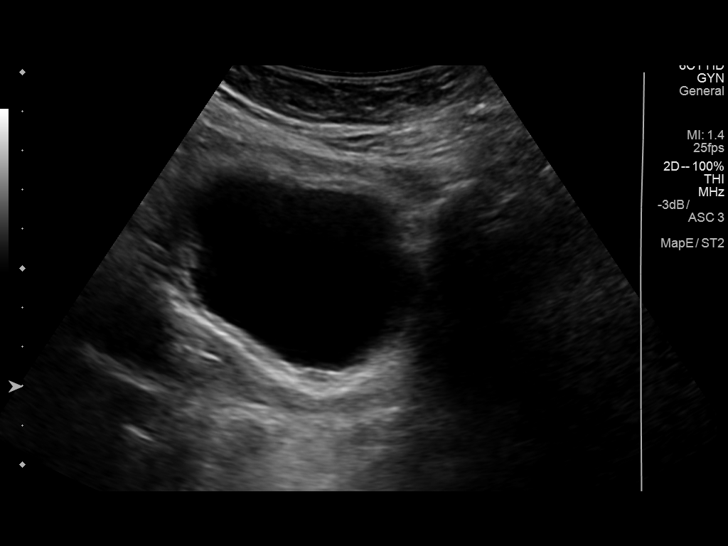
[im 17/68]
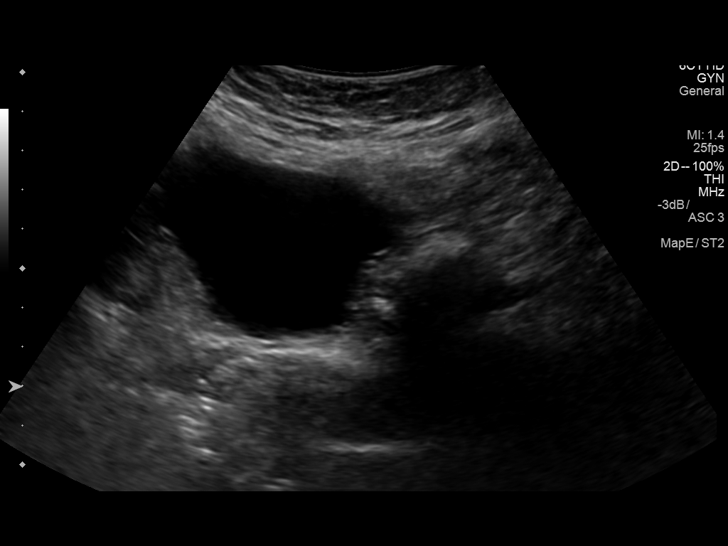
[im 23/68]
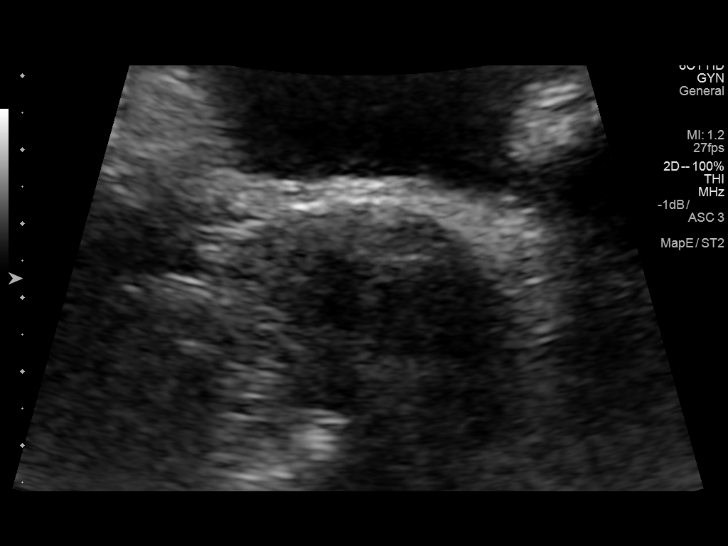
[im 26/68]
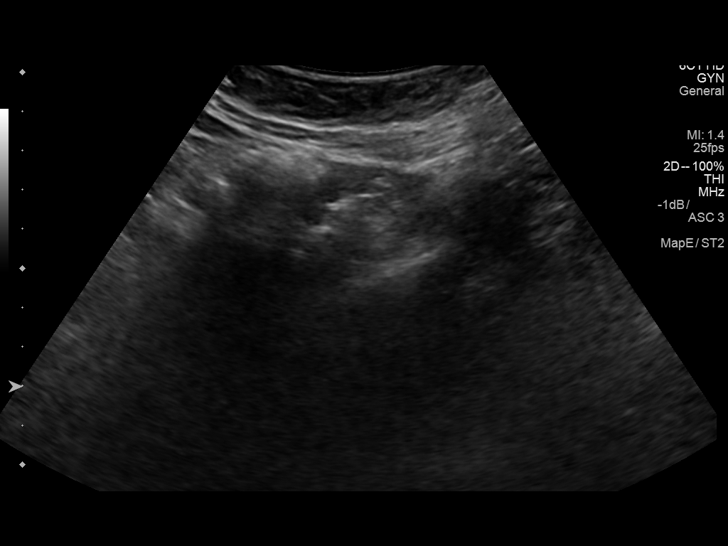
[im 31/68]
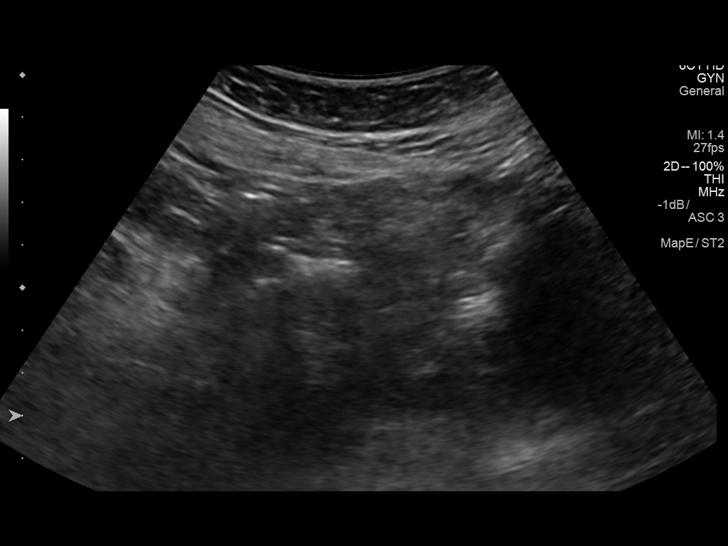
[im 37/68]
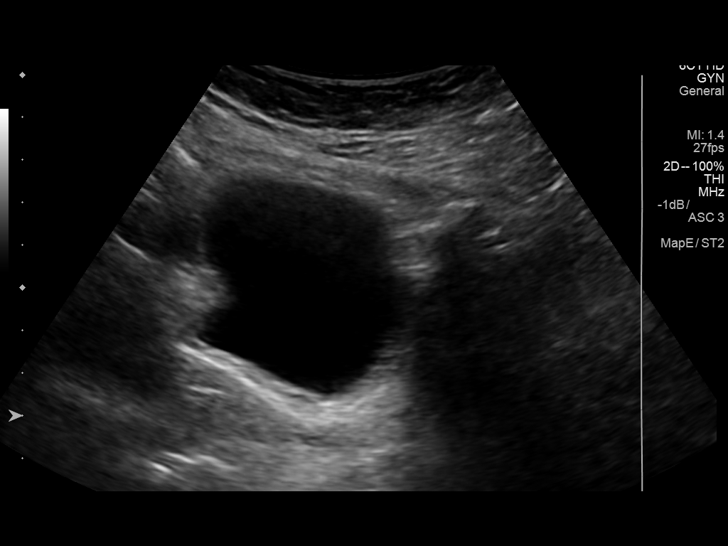
[im 42/68]
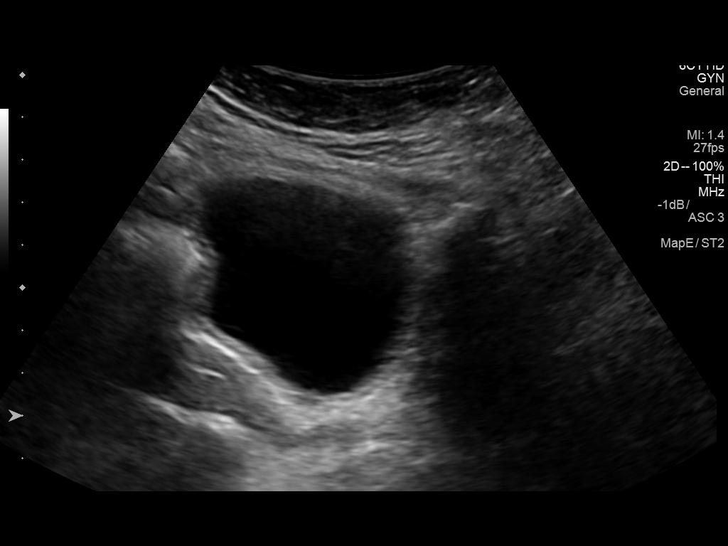
[im 45/68]
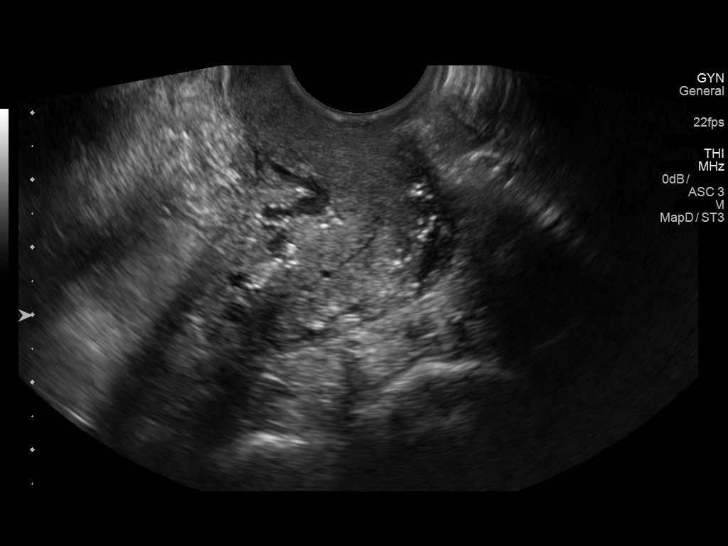
[im 51/68]
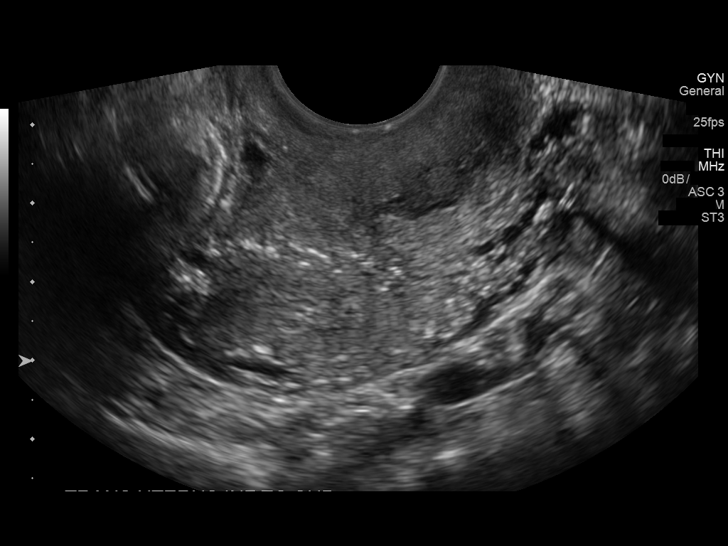
[im 56/68]
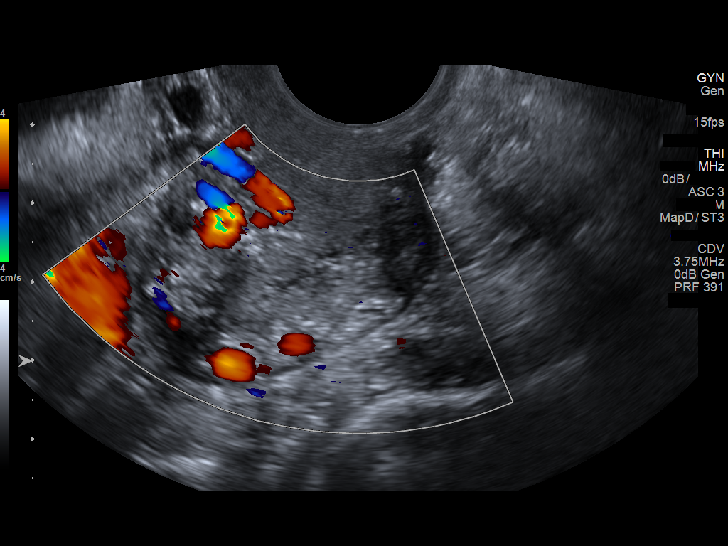
[im 62/68]
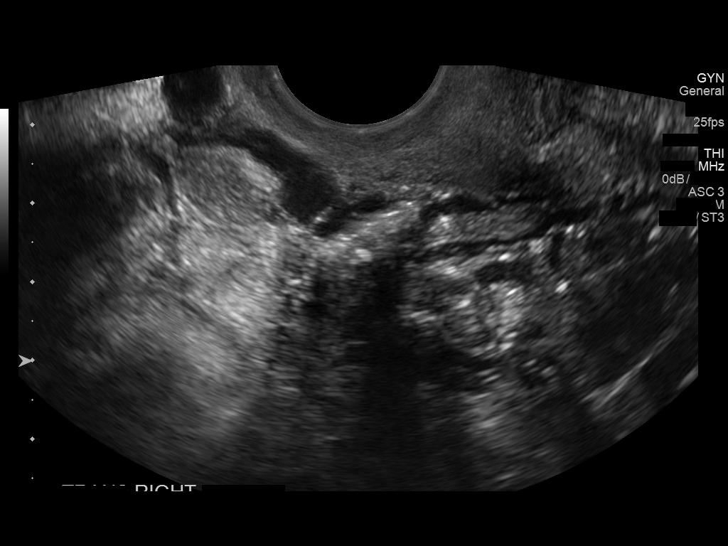
[im 68/68]
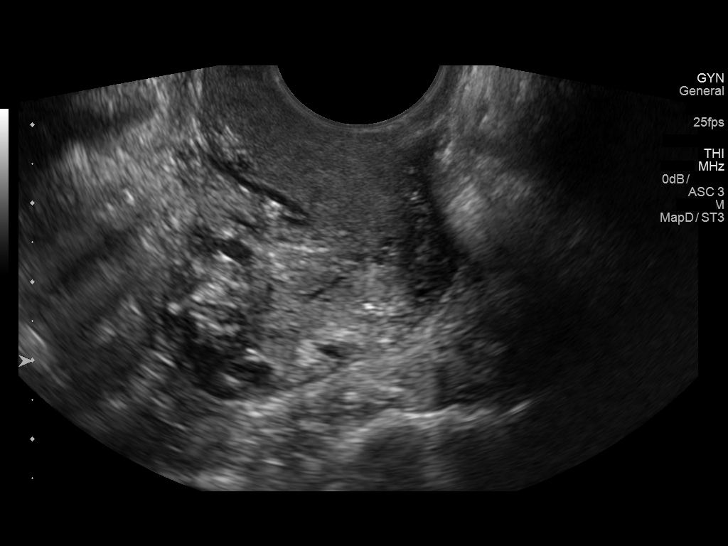

[14 of 25 positions shown; findings below may reference images not displayed]

FINDINGS: Uterus

Measurements: 3.6 x 1.9 x 2.7 cm. No fibroids or other mass
visualized.

Endometrium

Not well characterized. Trace fluid is noted at the endometrial
canal.

Right ovary

Not visualized.

Left ovary

Not visualized.

Other findings

No free fluid is seen within the pelvic cul-de-sac.
IMPRESSION: 1. Uterus diminutive and grossly unremarkable in appearance. Trace
fluid noted at the endometrial canal. Endometrial echo complex not
well seen. If the patient's vaginal bleeding persists, further
evaluation would be recommended to exclude underlying mass.
2. Ovaries not visualized.

## 2019-05-26 DIAGNOSIS — Z1159 Encounter for screening for other viral diseases: Secondary | ICD-10-CM | POA: Diagnosis not present

## 2019-05-31 DIAGNOSIS — Z1159 Encounter for screening for other viral diseases: Secondary | ICD-10-CM | POA: Diagnosis not present

## 2019-06-07 DIAGNOSIS — Z1159 Encounter for screening for other viral diseases: Secondary | ICD-10-CM | POA: Diagnosis not present

## 2019-06-21 DIAGNOSIS — Z1159 Encounter for screening for other viral diseases: Secondary | ICD-10-CM | POA: Diagnosis not present

## 2019-06-24 DIAGNOSIS — Z1159 Encounter for screening for other viral diseases: Secondary | ICD-10-CM | POA: Diagnosis not present

## 2019-06-28 DIAGNOSIS — Z1159 Encounter for screening for other viral diseases: Secondary | ICD-10-CM | POA: Diagnosis not present

## 2019-07-01 DIAGNOSIS — Z1159 Encounter for screening for other viral diseases: Secondary | ICD-10-CM | POA: Diagnosis not present

## 2019-07-05 DIAGNOSIS — Z1159 Encounter for screening for other viral diseases: Secondary | ICD-10-CM | POA: Diagnosis not present

## 2019-07-08 DIAGNOSIS — Z1159 Encounter for screening for other viral diseases: Secondary | ICD-10-CM | POA: Diagnosis not present

## 2019-07-12 DIAGNOSIS — Z1159 Encounter for screening for other viral diseases: Secondary | ICD-10-CM | POA: Diagnosis not present

## 2019-07-15 DIAGNOSIS — Z1159 Encounter for screening for other viral diseases: Secondary | ICD-10-CM | POA: Diagnosis not present

## 2019-07-19 DIAGNOSIS — Z1159 Encounter for screening for other viral diseases: Secondary | ICD-10-CM | POA: Diagnosis not present

## 2019-07-22 DIAGNOSIS — Z1159 Encounter for screening for other viral diseases: Secondary | ICD-10-CM | POA: Diagnosis not present

## 2019-08-02 DIAGNOSIS — Z1159 Encounter for screening for other viral diseases: Secondary | ICD-10-CM | POA: Diagnosis not present

## 2019-08-12 DIAGNOSIS — Z1159 Encounter for screening for other viral diseases: Secondary | ICD-10-CM | POA: Diagnosis not present

## 2019-08-30 DIAGNOSIS — Z1159 Encounter for screening for other viral diseases: Secondary | ICD-10-CM | POA: Diagnosis not present

## 2019-09-02 DIAGNOSIS — Z1159 Encounter for screening for other viral diseases: Secondary | ICD-10-CM | POA: Diagnosis not present

## 2019-09-06 DIAGNOSIS — Z1159 Encounter for screening for other viral diseases: Secondary | ICD-10-CM | POA: Diagnosis not present

## 2019-09-09 DIAGNOSIS — Z1159 Encounter for screening for other viral diseases: Secondary | ICD-10-CM | POA: Diagnosis not present

## 2019-09-13 DIAGNOSIS — Z1159 Encounter for screening for other viral diseases: Secondary | ICD-10-CM | POA: Diagnosis not present

## 2019-09-16 DIAGNOSIS — Z1159 Encounter for screening for other viral diseases: Secondary | ICD-10-CM | POA: Diagnosis not present

## 2019-09-20 DIAGNOSIS — Z1159 Encounter for screening for other viral diseases: Secondary | ICD-10-CM | POA: Diagnosis not present

## 2019-09-23 DIAGNOSIS — Z1159 Encounter for screening for other viral diseases: Secondary | ICD-10-CM | POA: Diagnosis not present

## 2019-09-27 DIAGNOSIS — Z1159 Encounter for screening for other viral diseases: Secondary | ICD-10-CM | POA: Diagnosis not present

## 2019-09-30 DIAGNOSIS — Z1159 Encounter for screening for other viral diseases: Secondary | ICD-10-CM | POA: Diagnosis not present

## 2019-10-04 DIAGNOSIS — Z1159 Encounter for screening for other viral diseases: Secondary | ICD-10-CM | POA: Diagnosis not present

## 2019-10-07 DIAGNOSIS — Z1159 Encounter for screening for other viral diseases: Secondary | ICD-10-CM | POA: Diagnosis not present

## 2019-10-11 DIAGNOSIS — Z1159 Encounter for screening for other viral diseases: Secondary | ICD-10-CM | POA: Diagnosis not present

## 2019-10-14 DIAGNOSIS — Z1159 Encounter for screening for other viral diseases: Secondary | ICD-10-CM | POA: Diagnosis not present

## 2019-10-18 DIAGNOSIS — Z1159 Encounter for screening for other viral diseases: Secondary | ICD-10-CM | POA: Diagnosis not present

## 2019-10-21 DIAGNOSIS — Z1159 Encounter for screening for other viral diseases: Secondary | ICD-10-CM | POA: Diagnosis not present

## 2019-10-25 DIAGNOSIS — Z1159 Encounter for screening for other viral diseases: Secondary | ICD-10-CM | POA: Diagnosis not present

## 2019-10-28 DIAGNOSIS — Z1159 Encounter for screening for other viral diseases: Secondary | ICD-10-CM | POA: Diagnosis not present

## 2019-11-01 DIAGNOSIS — Z1159 Encounter for screening for other viral diseases: Secondary | ICD-10-CM | POA: Diagnosis not present

## 2019-11-04 DIAGNOSIS — Z1159 Encounter for screening for other viral diseases: Secondary | ICD-10-CM | POA: Diagnosis not present

## 2019-11-08 DIAGNOSIS — Z1159 Encounter for screening for other viral diseases: Secondary | ICD-10-CM | POA: Diagnosis not present

## 2019-11-11 DIAGNOSIS — Z1159 Encounter for screening for other viral diseases: Secondary | ICD-10-CM | POA: Diagnosis not present

## 2019-11-15 DIAGNOSIS — Z1159 Encounter for screening for other viral diseases: Secondary | ICD-10-CM | POA: Diagnosis not present

## 2019-11-18 DIAGNOSIS — Z1159 Encounter for screening for other viral diseases: Secondary | ICD-10-CM | POA: Diagnosis not present

## 2019-11-22 DIAGNOSIS — Z1159 Encounter for screening for other viral diseases: Secondary | ICD-10-CM | POA: Diagnosis not present

## 2019-11-25 DIAGNOSIS — Z1159 Encounter for screening for other viral diseases: Secondary | ICD-10-CM | POA: Diagnosis not present

## 2019-11-29 DIAGNOSIS — Z1159 Encounter for screening for other viral diseases: Secondary | ICD-10-CM | POA: Diagnosis not present

## 2019-12-02 DIAGNOSIS — Z1159 Encounter for screening for other viral diseases: Secondary | ICD-10-CM | POA: Diagnosis not present

## 2019-12-06 DIAGNOSIS — Z1159 Encounter for screening for other viral diseases: Secondary | ICD-10-CM | POA: Diagnosis not present

## 2019-12-09 DIAGNOSIS — Z1159 Encounter for screening for other viral diseases: Secondary | ICD-10-CM | POA: Diagnosis not present

## 2019-12-16 DIAGNOSIS — Z1159 Encounter for screening for other viral diseases: Secondary | ICD-10-CM | POA: Diagnosis not present

## 2019-12-20 DIAGNOSIS — Z1159 Encounter for screening for other viral diseases: Secondary | ICD-10-CM | POA: Diagnosis not present

## 2019-12-23 DIAGNOSIS — Z1159 Encounter for screening for other viral diseases: Secondary | ICD-10-CM | POA: Diagnosis not present

## 2019-12-27 DIAGNOSIS — Z1159 Encounter for screening for other viral diseases: Secondary | ICD-10-CM | POA: Diagnosis not present

## 2019-12-30 DIAGNOSIS — Z1159 Encounter for screening for other viral diseases: Secondary | ICD-10-CM | POA: Diagnosis not present

## 2020-01-03 DIAGNOSIS — Z1159 Encounter for screening for other viral diseases: Secondary | ICD-10-CM | POA: Diagnosis not present

## 2020-01-06 DIAGNOSIS — Z1159 Encounter for screening for other viral diseases: Secondary | ICD-10-CM | POA: Diagnosis not present

## 2020-01-10 DIAGNOSIS — Z1159 Encounter for screening for other viral diseases: Secondary | ICD-10-CM | POA: Diagnosis not present

## 2020-01-13 DIAGNOSIS — Z1159 Encounter for screening for other viral diseases: Secondary | ICD-10-CM | POA: Diagnosis not present

## 2020-01-17 DIAGNOSIS — Z1159 Encounter for screening for other viral diseases: Secondary | ICD-10-CM | POA: Diagnosis not present

## 2020-01-20 DIAGNOSIS — Z1159 Encounter for screening for other viral diseases: Secondary | ICD-10-CM | POA: Diagnosis not present

## 2020-01-24 DIAGNOSIS — Z1159 Encounter for screening for other viral diseases: Secondary | ICD-10-CM | POA: Diagnosis not present

## 2020-01-27 DIAGNOSIS — Z1159 Encounter for screening for other viral diseases: Secondary | ICD-10-CM | POA: Diagnosis not present

## 2020-01-31 DIAGNOSIS — Z1159 Encounter for screening for other viral diseases: Secondary | ICD-10-CM | POA: Diagnosis not present

## 2020-02-03 DIAGNOSIS — Z1159 Encounter for screening for other viral diseases: Secondary | ICD-10-CM | POA: Diagnosis not present

## 2020-02-07 DIAGNOSIS — Z1159 Encounter for screening for other viral diseases: Secondary | ICD-10-CM | POA: Diagnosis not present

## 2020-02-14 DIAGNOSIS — Z1159 Encounter for screening for other viral diseases: Secondary | ICD-10-CM | POA: Diagnosis not present

## 2020-02-21 DIAGNOSIS — Z1159 Encounter for screening for other viral diseases: Secondary | ICD-10-CM | POA: Diagnosis not present

## 2020-02-24 DIAGNOSIS — Z1159 Encounter for screening for other viral diseases: Secondary | ICD-10-CM | POA: Diagnosis not present

## 2020-02-28 DIAGNOSIS — Z1159 Encounter for screening for other viral diseases: Secondary | ICD-10-CM | POA: Diagnosis not present

## 2020-03-02 DIAGNOSIS — Z1159 Encounter for screening for other viral diseases: Secondary | ICD-10-CM | POA: Diagnosis not present

## 2020-03-13 DIAGNOSIS — Z1159 Encounter for screening for other viral diseases: Secondary | ICD-10-CM | POA: Diagnosis not present

## 2020-03-16 DIAGNOSIS — Z1159 Encounter for screening for other viral diseases: Secondary | ICD-10-CM | POA: Diagnosis not present

## 2020-03-20 DIAGNOSIS — Z1159 Encounter for screening for other viral diseases: Secondary | ICD-10-CM | POA: Diagnosis not present

## 2020-03-23 DIAGNOSIS — Z1159 Encounter for screening for other viral diseases: Secondary | ICD-10-CM | POA: Diagnosis not present

## 2020-03-27 DIAGNOSIS — Z1159 Encounter for screening for other viral diseases: Secondary | ICD-10-CM | POA: Diagnosis not present

## 2020-03-30 DIAGNOSIS — Z1159 Encounter for screening for other viral diseases: Secondary | ICD-10-CM | POA: Diagnosis not present

## 2020-04-03 DIAGNOSIS — Z1159 Encounter for screening for other viral diseases: Secondary | ICD-10-CM | POA: Diagnosis not present

## 2020-04-24 DIAGNOSIS — Z1159 Encounter for screening for other viral diseases: Secondary | ICD-10-CM | POA: Diagnosis not present

## 2020-05-10 DEATH — deceased

## 2020-07-31 ENCOUNTER — Encounter: Payer: Self-pay | Admitting: Internal Medicine
